# Patient Record
Sex: Male | Born: 1955 | Race: White | Hispanic: No | Marital: Married | Smoking: Former smoker
Health system: Southern US, Community
[De-identification: ages and names within clinical notes are randomized; demographics above are authoritative.]

## PROBLEM LIST (undated history)

## (undated) DIAGNOSIS — I447 Left bundle-branch block, unspecified: Secondary | ICD-10-CM

## (undated) DIAGNOSIS — R251 Tremor, unspecified: Secondary | ICD-10-CM

## (undated) DIAGNOSIS — G4730 Sleep apnea, unspecified: Secondary | ICD-10-CM

## (undated) DIAGNOSIS — N529 Male erectile dysfunction, unspecified: Secondary | ICD-10-CM

## (undated) DIAGNOSIS — G629 Polyneuropathy, unspecified: Secondary | ICD-10-CM

## (undated) DIAGNOSIS — Z87442 Personal history of urinary calculi: Secondary | ICD-10-CM

## (undated) DIAGNOSIS — M4800 Spinal stenosis, site unspecified: Secondary | ICD-10-CM

## (undated) DIAGNOSIS — M722 Plantar fascial fibromatosis: Secondary | ICD-10-CM

## (undated) HISTORY — DX: Left bundle-branch block, unspecified: I44.7

## (undated) HISTORY — DX: Polyneuropathy, unspecified: G62.9

## (undated) HISTORY — DX: Male erectile dysfunction, unspecified: N52.9

## (undated) HISTORY — DX: Sleep apnea, unspecified: G47.30

## (undated) HISTORY — PX: OTHER SURGICAL HISTORY: SHX169

## (undated) HISTORY — DX: Plantar fascial fibromatosis: M72.2

## (undated) HISTORY — PX: THYROID LOBECTOMY: SHX420

---

## 1996-02-29 HISTORY — PX: INGUINAL HERNIA REPAIR: SUR1180

## 1999-05-18 ENCOUNTER — Encounter: Admission: RE | Admit: 1999-05-18 | Discharge: 1999-05-18 | Payer: Self-pay | Admitting: Otolaryngology

## 1999-05-18 ENCOUNTER — Encounter: Payer: Self-pay | Admitting: Otolaryngology

## 1999-06-21 ENCOUNTER — Encounter: Payer: Self-pay | Admitting: *Deleted

## 1999-06-21 ENCOUNTER — Encounter: Admission: RE | Admit: 1999-06-21 | Discharge: 1999-06-21 | Payer: Self-pay | Admitting: *Deleted

## 1999-06-25 ENCOUNTER — Other Ambulatory Visit: Admission: RE | Admit: 1999-06-25 | Discharge: 1999-06-25 | Payer: Self-pay | Admitting: *Deleted

## 1999-07-05 ENCOUNTER — Encounter: Payer: Self-pay | Admitting: *Deleted

## 1999-07-07 ENCOUNTER — Ambulatory Visit (HOSPITAL_COMMUNITY): Admission: RE | Admit: 1999-07-07 | Discharge: 1999-07-08 | Payer: Self-pay | Admitting: *Deleted

## 1999-07-07 ENCOUNTER — Encounter (INDEPENDENT_AMBULATORY_CARE_PROVIDER_SITE_OTHER): Payer: Self-pay | Admitting: *Deleted

## 1999-10-13 ENCOUNTER — Encounter: Admission: RE | Admit: 1999-10-13 | Discharge: 1999-10-13 | Payer: Self-pay | Admitting: *Deleted

## 1999-10-13 ENCOUNTER — Encounter: Payer: Self-pay | Admitting: *Deleted

## 2007-06-15 ENCOUNTER — Ambulatory Visit (HOSPITAL_COMMUNITY): Admission: RE | Admit: 2007-06-15 | Discharge: 2007-06-15 | Payer: Self-pay | Admitting: Family Medicine

## 2010-04-06 ENCOUNTER — Ambulatory Visit (HOSPITAL_COMMUNITY)
Admission: RE | Admit: 2010-04-06 | Discharge: 2010-04-06 | Disposition: A | Discharge status: Home / Self Care | Payer: PRIVATE HEALTH INSURANCE | Source: Ambulatory Visit | Attending: Family Medicine | Admitting: Family Medicine

## 2010-04-06 ENCOUNTER — Other Ambulatory Visit (HOSPITAL_COMMUNITY): Payer: Self-pay | Admitting: Family Medicine

## 2010-04-06 DIAGNOSIS — R0609 Other forms of dyspnea: Secondary | ICD-10-CM

## 2010-04-06 DIAGNOSIS — R1013 Epigastric pain: Secondary | ICD-10-CM | POA: Insufficient documentation

## 2010-04-06 DIAGNOSIS — F17200 Nicotine dependence, unspecified, uncomplicated: Secondary | ICD-10-CM

## 2010-07-16 NOTE — Discharge Summary (Signed)
[place]  Patient:    EFRAIM, VANALLEN                      MRN: [id number] Adm. Date:  [id number] Disc. Date: [id number] Attending:  Aundria Mems CC:         Dr. Hyacinth Meeker                           Discharge [facility] COURSE: This patient underwent right thyroid lobectomy/isthmusectomy on Jul 07, 1999 for a solitary solid nodule of the right lower lobe near the isthmus.  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  His postoperative course was without incident, and he was discharged home the morning following surgery.  He had normal voice quality postoperatively.  DISCHARGE MEDICATION: Vicodin 5 mg 1-2 tablets q.4h as-needed for pain.  DISCHARGE ACTIVITY: Advised as to no strenuous activity.  WOUND CARE: To keep the wound clean and dry.  DISCHARGE INSTRUCTIONS: To call should there be any concerns regarding appearance of the wound or other concerns or problems.  FOLLOW-UP: Return to the office in seven to 10 days for suture removal.  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 DD:  07/08/99 TD:  07/09/99 Job: [id number]

## 2010-07-16 NOTE — Op Note (Signed)
[place]  Patient:    John Norton, John Norton                      MRN: [id number] Proc. Date: 07/07/99 Adm. Date:  [id number] Disc. Date: [id number] Attending:  Aundria Mems                           Operative Report  PREOPERATIVE DIAGNOSIS:  Solitary solid right thyroid nodule.  POSTOPERATIVE DIAGNOSIS:  Right thyroid lobectomy, isthmusectomy.  OPERATION PERFORMED:  Per frozen section, benign-appearing follicular adenoma.  SURGEON:  Kathy Breach, M.D.  ASSISTANT:  Gloris Manchester. Lazarus Salines, M.D.  ANESTHESIA:  DESCRIPTION OF PROCEDURE:  With the patient under general orotracheal anesthesia and the patients neck extended, the anterior neck, upper chest and lower face was prepped and draped in the usual sterile fashion.  A horizontal incision supersternally about three to four inches in length was made in a low-lying skin crease.  Incision made through skin and subcutaneous tissues lateral to the platysma.  Flaps were elevated superiorly to under the level of the undersurface of the thyroid cartilage and inferiorly.  The strap muscles were split vertically in the midline and exposure of the right lobe of the thyroid was obtained, elevating the strap muscles off of the surface of the right thyroid lobe.  The patient had the freely mobile 1 cm nodule present more into the right lower lobe and not quite as close to the junction with the isthmus as physical exam and the ultrasound had suggested.  There were no other palpable nodes in the neck.  The lower margin of the thyroid gland was dissected free working around laterally and then taking down the superior pole elevating the superior pole and lobe towards the midline, dissecting immediately on the capsule of the thyroid lobe, clamping and tying vessels as encountered.  The recurrent laryngeal nerve was identified, entering the cricothyroid space in the expected position and was preserved by freeing the gland  from the trachea releasing Berrys ligament.  A clamp was placed at the junction of the isthmus with the left thyroid lobe divided and specimen delivered.  Hemostasis kept complete throughout the procedure with 3-0 and 4-0 silk ties and light electrocautery away from the nerve.  The wound was irrigated, hemostasis complete.  Jackson-Pratt drain was placed along the right paratracheal site and brought through a stab incision just inferior to the right side of the incision.  The incision was closed with interrupted 3-0 chromic catgut subcutaneously and running 5-0 nylon in the skin.  Frozen section report returned by Dr. Francoise Schaumann as benign-appearing follicular adenoma. Blood loss for the procedure was less than 50 cc.  The patient tolerated the procedure well and was taken to the recovery room in stable general condition. DD:  07/07/99 TD:  07/09/99 Job: [id number]

## 2010-08-21 ENCOUNTER — Emergency Department (HOSPITAL_COMMUNITY): Payer: PRIVATE HEALTH INSURANCE

## 2010-08-21 ENCOUNTER — Emergency Department (HOSPITAL_COMMUNITY)
Admission: EM | Admit: 2010-08-21 | Discharge: 2010-08-21 | Disposition: A | Discharge status: Other Acute Inpatient Hospital | Payer: PRIVATE HEALTH INSURANCE | Source: Ambulatory Visit | Attending: Emergency Medicine | Admitting: Emergency Medicine

## 2010-08-21 ENCOUNTER — Inpatient Hospital Stay (HOSPITAL_COMMUNITY)
Admission: AD | Admit: 2010-08-21 | Discharge: 2010-08-22 | DRG: 313 | Disposition: A | Discharge status: Home / Self Care | Payer: PRIVATE HEALTH INSURANCE | Source: Other Acute Inpatient Hospital | Attending: Cardiovascular Disease | Admitting: Cardiovascular Disease

## 2010-08-21 DIAGNOSIS — I447 Left bundle-branch block, unspecified: Secondary | ICD-10-CM | POA: Diagnosis present

## 2010-08-21 DIAGNOSIS — N529 Male erectile dysfunction, unspecified: Secondary | ICD-10-CM | POA: Diagnosis present

## 2010-08-21 DIAGNOSIS — G20 Parkinson's disease: Secondary | ICD-10-CM | POA: Diagnosis present

## 2010-08-21 DIAGNOSIS — G20A1 Parkinson's disease without dyskinesia, without mention of fluctuations: Secondary | ICD-10-CM | POA: Insufficient documentation

## 2010-08-21 DIAGNOSIS — Z79899 Other long term (current) drug therapy: Secondary | ICD-10-CM | POA: Insufficient documentation

## 2010-08-21 DIAGNOSIS — R079 Chest pain, unspecified: Secondary | ICD-10-CM | POA: Insufficient documentation

## 2010-08-21 DIAGNOSIS — R0789 Other chest pain: Principal | ICD-10-CM | POA: Diagnosis present

## 2010-08-21 DIAGNOSIS — I10 Essential (primary) hypertension: Secondary | ICD-10-CM | POA: Diagnosis present

## 2010-08-21 DIAGNOSIS — R7309 Other abnormal glucose: Secondary | ICD-10-CM | POA: Diagnosis present

## 2010-08-21 DIAGNOSIS — Z87891 Personal history of nicotine dependence: Secondary | ICD-10-CM

## 2010-08-21 DIAGNOSIS — G589 Mononeuropathy, unspecified: Secondary | ICD-10-CM | POA: Insufficient documentation

## 2010-08-21 HISTORY — PX: OTHER SURGICAL HISTORY: SHX169

## 2010-08-21 LAB — BASIC METABOLIC PANEL
CO2: 31 mEq/L (ref 19–32)
Calcium: 9.8 mg/dL (ref 8.4–10.5)
Creatinine, Ser: 1.24 mg/dL (ref 0.50–1.35)
GFR calc non Af Amer: >60 mL/min (ref >60)
Glucose, Bld: 158 mg/dL — ABNORMAL HIGH (ref 70–99)
Sodium: 142 mEq/L (ref 135–145)

## 2010-08-21 LAB — CARDIAC PANEL(CRET KIN+CKTOT+MB+TROPI)
CK, MB: 1.5 ng/mL (ref 0.3–4.0)
Total CK: 50 U/L (ref 7–232)
Troponin I: <0.3 ng/mL (ref <0.30)

## 2010-08-21 LAB — CBC
MCH: 31.3 pg (ref 26.0–34.0)
MCHC: 33.5 g/dL (ref 30.0–36.0)
MCV: 93.3 fL (ref 78.0–100.0)
Platelets: 207 10*3/uL (ref 150–400)
RBC: 5.08 MIL/uL (ref 4.22–5.81)

## 2010-08-21 LAB — HEMOGLOBIN A1C
Hgb A1c MFr Bld: 5.9 % — ABNORMAL HIGH (ref <5.7)
Mean Plasma Glucose: 123 mg/dL — ABNORMAL HIGH (ref <117)

## 2010-08-21 LAB — CK TOTAL AND CKMB (NOT AT ARMC)
CK, MB: 1.8 ng/mL (ref 0.3–4.0)
Total CK: 69 U/L (ref 7–232)

## 2010-08-21 LAB — PROTIME-INR
INR: 1 (ref 0.00–1.49)
Prothrombin Time: 13.4 seconds (ref 11.6–15.2)

## 2010-08-21 LAB — DIFFERENTIAL
Basophils Relative: 1 % (ref 0–1)
Eosinophils Absolute: 0.1 10*3/uL (ref 0.0–0.7)
Lymphs Abs: 2.1 10*3/uL (ref 0.7–4.0)
Monocytes Absolute: 0.6 10*3/uL (ref 0.1–1.0)
Monocytes Relative: 8 % (ref 3–12)

## 2010-08-21 LAB — HEPATIC FUNCTION PANEL
Alkaline Phosphatase: 55 U/L (ref 39–117)
Indirect Bilirubin: 0.7 mg/dL (ref 0.3–0.9)
Total Bilirubin: 0.8 mg/dL (ref 0.3–1.2)

## 2010-08-22 ENCOUNTER — Inpatient Hospital Stay (HOSPITAL_COMMUNITY): Payer: PRIVATE HEALTH INSURANCE

## 2010-08-22 DIAGNOSIS — R079 Chest pain, unspecified: Secondary | ICD-10-CM

## 2010-08-22 HISTORY — PX: OTHER SURGICAL HISTORY: SHX169

## 2010-08-22 LAB — BASIC METABOLIC PANEL
BUN: 16 mg/dL (ref 6–23)
Chloride: 104 mEq/L (ref 96–112)
GFR calc Af Amer: >60 mL/min (ref >60)
GFR calc non Af Amer: >60 mL/min (ref >60)
Potassium: 4.4 mEq/L (ref 3.5–5.1)
Sodium: 139 mEq/L (ref 135–145)

## 2010-08-22 LAB — TSH: TSH: 0.746 u[IU]/mL (ref 0.350–4.500)

## 2010-08-22 LAB — CARDIAC PANEL(CRET KIN+CKTOT+MB+TROPI)
CK, MB: 1.1 ng/mL (ref 0.3–4.0)
Relative Index: INVALID (ref 0.0–2.5)
Troponin I: <0.3 ng/mL (ref <0.30)

## 2010-08-22 LAB — LIPID PANEL
Cholesterol: 161 mg/dL (ref 0–200)
HDL: 24 mg/dL — ABNORMAL LOW (ref >39)
Triglycerides: 247 mg/dL — ABNORMAL HIGH (ref <150)

## 2010-08-22 MED ORDER — TECHNETIUM TC 99M TETROFOSMIN IV KIT
30.0000 | PACK | Freq: Once | INTRAVENOUS | Status: AC | PRN
  Administered 2010-08-22: 30 via INTRAVENOUS

## 2010-08-22 MED ORDER — TECHNETIUM TC 99M TETROFOSMIN IV KIT
10.0000 | PACK | Freq: Once | INTRAVENOUS | Status: AC | PRN
  Administered 2010-08-22: 10 via INTRAVENOUS

## 2010-08-23 ENCOUNTER — Other Ambulatory Visit (HOSPITAL_COMMUNITY): Payer: PRIVATE HEALTH INSURANCE

## 2010-08-24 NOTE — H&P (Addendum)
NAMEEYOEL, THROGMORTON NO.:  [id number]  MEDICAL RECORD NO.:  [id number]  LOCATION:  3743                         FACILITY:  [facility]  PHYSICIAN:  Vesta Mixer, M.D. DATE OF BIRTH:  [birth year]  DATE OF ADMISSION:  08/21/2010 DATE OF DISCHARGE:                             HISTORY & PHYSICAL   CHIEF COMPLAINT:  Rib pain.  HISTORY OF PRESENT ILLNESS:  [name] is a 55 year old gentleman with no prior history of coronary artery disease, but a known left bundle branch block, Parkinson disease, and a remote normal stress test, who presented to [facility] with complaints of rib discomfort.  He describes an abdominal tightness sensation/discomfort, radiating up to his right chest.  It is intermittent and not particularly exacerbated by anything, but comes and goes on its own.  It was relieved by both nitroglycerin and morphine in the ED at [facility].  He is currently pain free upon transfer to [facility].  Enzymes have been negative x1.  His EKG shows a left bundle branch block with occasional PVC, otherwise, there were no acute changes.  He denies any exertional symptoms such as chest pain, shortness of breath, or diaphoresis while doing more strenuous ADLs like mowing the lawn.  PAST MEDICAL HISTORY: 1. Left bundle branch block. 2. Parkinson disease. 3. Erectile dysfunction. 4. Remote negative stress test. 5. Right thyroid lobectomy/isthmusectomy in May 2001, felt to be a     hyperplastic nodule.  MEDICATIONS: 1. Viagra 100 mg daily as needed for ED. 2. Propranolol 20 mg b.i.d. 3. Sinemet 25/100 mg t.i.d. 4. Azilect 1 mg daily. 5. Calcium OTC 1 tablet daily. 6. Multivitamin 1 tablet daily. 7. Vitamin B12 one tablet daily. 8. Fish oil 1 tablet daily. 9. Lyrica 200 mg t.i.d.  ALLERGIES:  NO KNOWN DRUG ALLERGIES.  SOCIAL HISTORY:  The patient lives in [place].  He is retired from [employer].  He has a remote history of tobacco  use, but quit.  He denies any alcohol use.  FAMILY HISTORY:  Negative for coronary artery disease in his immediate family.  He does have an aunt who had some sort of heart problems.  LABORATORY DATA:  WBC 7.4, hemoglobin 15.9, hematocrit 47.4, platelet count 207.  Sodium 142, potassium 3.7, chloride 103, CO2 of 31, glucose 158, BUN 22, creatinine 1.24.  Cardiac enzymes negative x1.  STUDIES:  Chest x-ray on August 21, 2010, showed negative chest.  PHYSICAL EXAMINATION:  VITAL SIGNS:  Temperature is 97.9, pulse 63, respirations 18, blood pressure 141/79, pulse ox 96%. GENERAL:  This is a pleasant white male in no acute distress. HEENT:  Normocephalic, atraumatic with extraocular movements intact. Sclerae _.  Nares without discharge. NECK:  Supple, without carotid bruits. Cardiovascular:  Auscultation to the heart reveals regular rate and rhythm, with S1 and S2 without murmurs, rubs, or gallops. Lungs:  Clear to auscultation bilaterally without wheezes, rales, or rhonchi. Abdomen:  Soft, nontender, nondistended, with positive bowel sounds. Extremities:  Warm and dry, without edema. Neurologic:  He is alert and oriented x3.  He responds to questions appropriately with a normal affect.  ASSESSMENT/PLAN:  The patient was seen and examined by  Dr. Elease Hashimoto and myself.  This is a 55 year old gentleman with no prior cardiac history, but history of known left bundle branch block with remote normal stress test as well as Parkinson disease, who presents to initially [facility] to the [facility] with complaints of chest discomfort/abdominal discomfort.  At this time, his symptoms are somewhat atypical in nature. His enzymes have been negative and EKG is without acute changes, but he did have some relief with nitroglycerin.  At this time, we will admit him to the hospital and cycle cardiac enzymes.  Give this Parkinson disease, we will plan for a Lexiscan Myoview in the morning to  risk stratify him.  His blood pressure was somewhat elevated at [facility], so he may be newly hypertensive, as well as with an elevated blood sugar of 158, we will rule out diabetes as well.  Further recommendations to follow based on findings.  He will require cardiac catheterization if his enzymes turn positive.  We will only fully anticoagulate if the patient's pain recurs or enzymes turn positive, and for now we will initiate him on DVT dosing heparin.     Ronie Spies, P.A.C.   ______________________________ Vesta Mixer, M.D.    DD/MEDQ  D:  08/21/2010  T:  08/21/2010  Job:  [id number]  cc:   Vesta Mixer, M.D.  Electronically Signed by Ronie Spies  on 08/24/2010 02:14:27 PM Electronically Signed by Kristeen Miss M.D. on 09/16/2010 02:44:54 PM

## 2010-08-30 ENCOUNTER — Encounter: Payer: Self-pay | Admitting: Nurse Practitioner

## 2010-09-07 ENCOUNTER — Ambulatory Visit: Payer: PRIVATE HEALTH INSURANCE | Admitting: Nurse Practitioner

## 2010-09-16 NOTE — Discharge Summary (Signed)
John Norton, KLECKNER NO.:  [id number]  MEDICAL RECORD NO.:  [id number]  LOCATION:  3743                         FACILITY:  [facility]  PHYSICIAN:  Vesta Mixer, M.D. DATE OF BIRTH:  1955/10/23  DATE OF ADMISSION:  08/21/2010 DATE OF DISCHARGE:  08/22/2010                              DISCHARGE SUMMARY   PRIMARY CARDIOLOGIST:  Vesta Mixer, MD  DISCHARGE DIAGNOSIS:  Chest pain, the patient ruled out for myocardial infarction this admission. a.  Status post Lexiscan Myoview.  There is no stress-induced ischemia, but a fixed deficit in the lateral apex and inferior wall. b.  Ejection fraction 51%.  SECONDARY DIAGNOSIS: 1. Left bundle-branch block. 2. Parkinson disease. 3. Erectile dysfunction. 4. Right thyroid lobectomy/isthmusectomy in May 2001 thought to be a     hyperplastic nodule.  ALLERGIES:  No known drug allergies.  PROCEDURES/DIAGNOSTICS PERFORMED DURING HOSPITALIZATION:1. Lexiscan Myoview on August 22, 2010:  There was a fixed defect in the     lateral apex and inferior wall.  Moderate septal hypokinesis is not     specific.  Ejection fraction 51%.  No stress-induced ischemia. 2. Chest x-ray on August 21, 2010:  No cardiopulmonary process noted.  REASON FOR HOSPITALIZATION:  This is a 55 year old gentleman without known prior coronary artery disease with the above-stated problem list who presented to the [facility] with rib discomfort, abdominal tightness and discomfort as well as radiating sensation up to his right chest.  Upon arrival to the emergency department, the pain was relieved by nitroglycerin and morphine.  His EKG showed left bundle-branch block with occasional PVC, left bundle-branch block is old.  Initial enzymes were negative x1.  The patient was transferred to [facility] for further evaluation.  The patient was sent to the Telemetry Unit at [facility].  He ruled out for myocardial infarction with cardiac enzymes being  negative x3.  His EKG was without acute changes, but did not note a known left bundle-branch block.  The patient had no further complaints of chest pain.  With his cardiac enzymes being negative, I was felt that Lexiscan Myoview would be ordered to risk stratify.  As above, Lexiscan Myoview showed no inducible ischemia, but a trach defect in the lateral apex and inferior wall with moderate septal hypokinesis.  Ejection fraction was preserved.  Dr. Johney Frame discussed Myoview results with the patient noting that was a scar, but not ischemia.  Options of medical treatment versus catheterization were discussed in full.  The patient made it clear that he wishes to avoid catheterization at this time.  Therefore, the patient will be discharged home on medical therapy with aspirin, beta blocker and sublingual nitroglycerin.  Further followup and further disposition for possible cardiac catheterization will be with Dr. Elease Hashimoto in the outpatient setting.  DISCHARGE LABORATORY DATA:  TSH 0.746.  Cardiac enzymes negative x3. Sodium 139, potassium 4.4, BUN 16, creatinine 0.85.  DISCHARGE MEDICATIONS: 1. Aspirin 81 mg enteric-coated 1 tablet daily. 2. Nitroglycerin 0.4 mg tablets sublingual 1 tablet under tongue every     5 minutes up to 3 doses. 3. Azilect 1 mg daily. 4. Calcium over-the-counter 1 tablet daily. 5. Carbidopa and levodopa 25/100  mg 1 tablet 3 times a day. 6. Fish oil over-the-counter 1 tablet daily. 7. Lyrica 200 mg 2 tablets 3 times a day. 8. Multivitamin over-the-counter daily. 9. Propranolol 20 mg twice daily. 10.Viagra 100 mg 1 tablet daily as needed for AD. 11.Vitamin B12 over-the-counter 1 tablet daily.  FOLLOWUP PLANS AND INSTRUCTIONS: 1. The patient will follow up with Dr. Elease Hashimoto in 2-3 weeks, the office     will call to schedule this appointment. 2. The patient should follow up with primary care provider as     previously scheduled. 3. The patient should increase  activity as tolerated. 4. The patient should continue low-sodium heart-healthy diet. 5. The patient should stop any activity that causes chest pain or     shortness of breath. 6. The patient should call the office in the interim for any problems     or concerns.  DURATION OF DISCHARGE:  Greater than 30 minutes with physician and physician assistant time.     Leonette Monarch, PA-C   ______________________________ Vesta Mixer, M.D.    NB/MEDQ  D:  08/22/2010  T:  08/23/2010  Job:  [id number]  cc:   Vesta Mixer, M.D.  Electronically Signed by Alen Blew P.A. on 09/02/2010 12:57:56 PM Electronically Signed by Kristeen Miss M.D. on 09/16/2010 02:44:51 PM

## 2010-10-04 ENCOUNTER — Telehealth: Payer: Self-pay | Admitting: Cardiovascular Disease

## 2010-10-04 NOTE — Telephone Encounter (Signed)
Spoke with dr Patty Sermons, pt should wait 12 hours between viagra and nitro use. Pharmacy catherine informed

## 2010-10-04 NOTE — Telephone Encounter (Signed)
Rep called to report drug interaction with Nitrosat from [employer] and Viagra from Dr. Lalla Brothers, please call Santina Evans back at [phone]

## 2010-10-04 NOTE — Telephone Encounter (Signed)
Dr. Lalla Brothers is not in the area any longer, needs to speak to someone about the drug interactions.

## 2010-10-04 NOTE — Telephone Encounter (Signed)
Pharmacy wants to know how long in between meds for safety. Told to call dr that prescribed viagra, dr Ian Bushman out of office this week.

## 2011-03-01 HISTORY — PX: POLYPECTOMY: SHX149

## 2011-03-01 HISTORY — PX: COLONOSCOPY: SHX174

## 2011-04-14 ENCOUNTER — Encounter: Payer: Self-pay | Admitting: Gastroenterology

## 2011-05-12 ENCOUNTER — Ambulatory Visit (AMBULATORY_SURGERY_CENTER): Payer: Commercial Managed Care - PPO | Admitting: *Deleted

## 2011-05-12 VITALS — Ht 72.0 in | Wt 210.1 lb

## 2011-05-12 DIAGNOSIS — Z1211 Encounter for screening for malignant neoplasm of colon: Secondary | ICD-10-CM

## 2011-05-12 MED ORDER — PEG-KCL-NACL-NASULF-NA ASC-C 100 G PO SOLR: 1.0000 | Freq: Once | ORAL | Status: DC

## 2011-05-26 ENCOUNTER — Other Ambulatory Visit: Payer: PRIVATE HEALTH INSURANCE | Admitting: Gastroenterology

## 2011-06-28 ENCOUNTER — Encounter: Payer: Self-pay | Admitting: Gastroenterology

## 2011-06-28 ENCOUNTER — Ambulatory Visit (AMBULATORY_SURGERY_CENTER): Payer: Commercial Managed Care - PPO | Admitting: Gastroenterology

## 2011-06-28 VITALS — BP 137/74 | HR 46 | Temp 96.9°F | Resp 18 | Ht 72.0 in | Wt 210.0 lb

## 2011-06-28 DIAGNOSIS — D126 Benign neoplasm of colon, unspecified: Secondary | ICD-10-CM

## 2011-06-28 DIAGNOSIS — Z1211 Encounter for screening for malignant neoplasm of colon: Secondary | ICD-10-CM

## 2011-06-28 MED ORDER — SODIUM CHLORIDE 0.9 % IV SOLN: 500.0000 mL | INTRAVENOUS | Status: DC

## 2011-06-28 NOTE — Progress Notes (Signed)
Patient did not have preoperative order for IV antibiotic SSI prophylaxis. (G8918)  Patient did not experience any of the following events: a burn prior to discharge; a fall within the facility; wrong site/side/patient/procedure/implant event; or a hospital transfer or hospital admission upon discharge from the facility. (G8907)  

## 2011-06-28 NOTE — Patient Instructions (Signed)

## 2011-06-28 NOTE — Op Note (Signed)
[facility] [address] [place], [place]  [address]  COLONOSCOPY PROCEDURE REPORT  PATIENT:  Kilo, Eshelman  MR#:  [id number] BIRTHDATE:  May 15, 1955, 56 yrs. old  GENDER:  male ENDOSCOPIST:  Judie Petit T. Russella Dar, MD, [facility] Referred by:  Karleen Hampshire, M.D. PROCEDURE DATE:  06/28/2011 PROCEDURE:  Colonoscopy with snare polypectomy ASA CLASS:  Class II INDICATIONS:  1) Routine Risk Screening MEDICATIONS:   MAC sedation, administered by CRNA, propofol (Diprivan) 150 mg IV DESCRIPTION OF PROCEDURE:   After the risks benefits and alternatives of the procedure were thoroughly explained, informed consent was obtained.  Digital rectal exam was performed and revealed no abnormalities.   The LB [id number]-[id number] endoscope was introduced through the anus and advanced to the cecum, which was identified by both the appendix and ileocecal valve, without limitations.  The quality of the prep was good, using MoviPrep. The instrument was then slowly withdrawn as the colon was fully examined. <<PROCEDUREIMAGES>> FINDINGS:  Two polyps were found in the proximal transverse colon. They were 5 - 6 mm in size. Polyps were snared without cautery. Retrieval was successful. A sessile polyp was found in the sigmoid colon. It was 5 mm in size. Polyp was snared without cautery. Retrieval was unsuccessful. Otherwise normal colonoscopy without other polyps, masses, vascular ectasias, or inflammatory changes. Retroflexed views in the rectum revealed no abnormalities. The time to cecum =  2  minutes. The scope was then withdrawn (time = 10.67 min) from the patient and the procedure completed.  COMPLICATIONS:  None  ENDOSCOPIC IMPRESSION: 1) 5 - 6 mm Two polyps in the proximal transverse colon 2) 5 mm sessile polyp in the sigmoid colon  RECOMMENDATIONS: 1) Await pathology results 2) If the polyps are adenomatous (pre-cancerous), repeat colonoscopy in 5 years. Otherwise follow colorectal  cancer screening guidelines for "routine risk" patients with colonoscopy in 10 years.  Venita Lick. Russella Dar, MD, Clementeen Graham  n. eSIGNED:   Venita Lick. Taila Basinski at 06/28/2011 10:31 AM  [address], [id number]

## 2011-06-29 ENCOUNTER — Telehealth: Payer: Self-pay | Admitting: *Deleted

## 2011-06-29 NOTE — Telephone Encounter (Signed)
No answer, left message to call office for questions or concerns. 

## 2011-07-04 ENCOUNTER — Encounter: Payer: Self-pay | Admitting: Gastroenterology

## 2011-07-13 ENCOUNTER — Encounter: Payer: Self-pay | Admitting: Gastroenterology

## 2011-10-19 ENCOUNTER — Other Ambulatory Visit (HOSPITAL_COMMUNITY): Payer: Self-pay | Admitting: Physician Assistant

## 2011-10-19 DIAGNOSIS — R109 Unspecified abdominal pain: Secondary | ICD-10-CM

## 2011-10-21 ENCOUNTER — Ambulatory Visit (HOSPITAL_COMMUNITY)
Admission: RE | Admit: 2011-10-21 | Discharge: 2011-10-21 | Disposition: A | Discharge status: Home / Self Care | Payer: Commercial Managed Care - PPO | Source: Ambulatory Visit | Attending: Physician Assistant | Admitting: Physician Assistant

## 2011-10-21 DIAGNOSIS — R109 Unspecified abdominal pain: Secondary | ICD-10-CM | POA: Insufficient documentation

## 2011-10-21 DIAGNOSIS — R918 Other nonspecific abnormal finding of lung field: Secondary | ICD-10-CM | POA: Insufficient documentation

## 2011-10-21 DIAGNOSIS — N201 Calculus of ureter: Secondary | ICD-10-CM | POA: Insufficient documentation

## 2012-01-13 ENCOUNTER — Other Ambulatory Visit (HOSPITAL_COMMUNITY): Payer: Self-pay | Admitting: Physician Assistant

## 2012-01-13 DIAGNOSIS — Z87442 Personal history of urinary calculi: Secondary | ICD-10-CM

## 2012-01-13 DIAGNOSIS — R109 Unspecified abdominal pain: Secondary | ICD-10-CM

## 2012-01-13 DIAGNOSIS — J984 Other disorders of lung: Secondary | ICD-10-CM

## 2012-01-16 ENCOUNTER — Ambulatory Visit (HOSPITAL_COMMUNITY)
Admission: RE | Admit: 2012-01-16 | Discharge: 2012-01-16 | Disposition: A | Discharge status: Home / Self Care | Payer: Commercial Managed Care - PPO | Source: Ambulatory Visit | Attending: Physician Assistant | Admitting: Physician Assistant

## 2012-01-16 DIAGNOSIS — R911 Solitary pulmonary nodule: Secondary | ICD-10-CM | POA: Insufficient documentation

## 2012-01-16 DIAGNOSIS — J984 Other disorders of lung: Secondary | ICD-10-CM

## 2012-01-16 MED ORDER — IOHEXOL 300 MG/ML  SOLN
80.0000 mL | Freq: Once | INTRAMUSCULAR | Status: AC | PRN
  Administered 2012-01-16: 80 mL via INTRAVENOUS

## 2012-04-16 ENCOUNTER — Emergency Department (HOSPITAL_COMMUNITY)
Admission: EM | Admit: 2012-04-16 | Discharge: 2012-04-16 | Disposition: A | Discharge status: Home / Self Care | Payer: Commercial Managed Care - PPO | Attending: Emergency Medicine | Admitting: Emergency Medicine

## 2012-04-16 ENCOUNTER — Emergency Department (HOSPITAL_COMMUNITY): Payer: Commercial Managed Care - PPO

## 2012-04-16 ENCOUNTER — Encounter (HOSPITAL_COMMUNITY): Payer: Self-pay | Admitting: *Deleted

## 2012-04-16 ENCOUNTER — Other Ambulatory Visit (HOSPITAL_COMMUNITY): Payer: Self-pay | Admitting: Internal Medicine

## 2012-04-16 DIAGNOSIS — Z79899 Other long term (current) drug therapy: Secondary | ICD-10-CM | POA: Insufficient documentation

## 2012-04-16 DIAGNOSIS — G20 Parkinson's disease: Secondary | ICD-10-CM | POA: Insufficient documentation

## 2012-04-16 DIAGNOSIS — Z8669 Personal history of other diseases of the nervous system and sense organs: Secondary | ICD-10-CM | POA: Insufficient documentation

## 2012-04-16 DIAGNOSIS — R1013 Epigastric pain: Secondary | ICD-10-CM

## 2012-04-16 DIAGNOSIS — Z87891 Personal history of nicotine dependence: Secondary | ICD-10-CM | POA: Insufficient documentation

## 2012-04-16 DIAGNOSIS — G20A1 Parkinson's disease without dyskinesia, without mention of fluctuations: Secondary | ICD-10-CM | POA: Insufficient documentation

## 2012-04-16 DIAGNOSIS — Z7982 Long term (current) use of aspirin: Secondary | ICD-10-CM | POA: Insufficient documentation

## 2012-04-16 DIAGNOSIS — Z8679 Personal history of other diseases of the circulatory system: Secondary | ICD-10-CM | POA: Insufficient documentation

## 2012-04-16 DIAGNOSIS — K8050 Calculus of bile duct without cholangitis or cholecystitis without obstruction: Secondary | ICD-10-CM

## 2012-04-16 DIAGNOSIS — R110 Nausea: Secondary | ICD-10-CM | POA: Insufficient documentation

## 2012-04-16 DIAGNOSIS — Z87448 Personal history of other diseases of urinary system: Secondary | ICD-10-CM | POA: Insufficient documentation

## 2012-04-16 DIAGNOSIS — Z87442 Personal history of urinary calculi: Secondary | ICD-10-CM | POA: Insufficient documentation

## 2012-04-16 DIAGNOSIS — K8020 Calculus of gallbladder without cholecystitis without obstruction: Secondary | ICD-10-CM

## 2012-04-16 LAB — HEPATIC FUNCTION PANEL
AST: 25 U/L (ref 0–37)
Bilirubin, Direct: 0.2 mg/dL (ref 0.0–0.3)
Total Bilirubin: 0.9 mg/dL (ref 0.3–1.2)

## 2012-04-16 LAB — CBC WITH DIFFERENTIAL/PLATELET
Eosinophils Absolute: 0.2 10*3/uL (ref 0.0–0.7)
Eosinophils Relative: 2 % (ref 0–5)
Hemoglobin: 14.1 g/dL (ref 13.0–17.0)
Lymphs Abs: 2.5 10*3/uL (ref 0.7–4.0)
MCH: 31.1 pg (ref 26.0–34.0)
MCV: 89.6 fL (ref 78.0–100.0)
Monocytes Relative: 6 % (ref 3–12)
RBC: 4.53 MIL/uL (ref 4.22–5.81)

## 2012-04-16 LAB — BASIC METABOLIC PANEL
BUN: 15 mg/dL (ref 6–23)
Calcium: 9.8 mg/dL (ref 8.4–10.5)
Creatinine, Ser: 0.86 mg/dL (ref 0.50–1.35)
GFR calc non Af Amer: >90 mL/min (ref >90)
Glucose, Bld: 111 mg/dL — ABNORMAL HIGH (ref 70–99)

## 2012-04-16 LAB — LIPASE, BLOOD: Lipase: 33 U/L (ref 11–59)

## 2012-04-16 MED ORDER — ONDANSETRON HCL 4 MG/2ML IJ SOLN
4.0000 mg | Freq: Once | INTRAMUSCULAR | Status: AC
  Administered 2012-04-16: 4 mg via INTRAVENOUS
  Filled 2012-04-16: qty 2

## 2012-04-16 MED ORDER — HYDROMORPHONE HCL PF 1 MG/ML IJ SOLN
1.0000 mg | Freq: Once | INTRAMUSCULAR | Status: AC
  Administered 2012-04-16: 1 mg via INTRAVENOUS
  Filled 2012-04-16: qty 1

## 2012-04-16 MED ORDER — OXYCODONE-ACETAMINOPHEN 5-325 MG PO TABS: 2.0000 | ORAL_TABLET | ORAL | Status: DC | PRN

## 2012-04-16 NOTE — ED Provider Notes (Signed)
History    This chart was scribed for Gilda Crease, MD by Gerlean Ren, ED Scribe. This patient was seen in room APA10/APA10 and the patient's care was started at 4:39 PM    CSN: [id number]  Arrival date & time 04/16/12  1609   First MD Initiated Contact with Patient 04/16/12 1637      Chief Complaint  Patient presents with  . Abdominal Pain     The history is provided by the patient. No language interpreter was used.  John Norton is a 57 y.o. male with h/o LBBB, Parkinson's disease who presents to the Emergency Department complaining of constant, moderate epigastric and RUQ pain with gradual onset beginning at 1:00 PM after eating.  Pain radiates to back between scapulae.  Associated nausea but no emesis.  Pt reports first incident of this pain occurred 1.5 years ago after eating and has occurred multiple times since.  Pt reports frequency and severity of pain have been increasing over the past several months.  Pt saw Dr. Sherwood Gambler this morning for the worsening problem but states he felt fine prior to the visit and that pain began after eating 2 hours after leaving the office.  Pt has gall bladder US scheduled for tomorrow.     Past Medical History  Diagnosis Date  . LBBB (left bundle branch block)   . Parkinson's disease   . ED (erectile dysfunction)   . Neuropathy     trigeminal pain  . Sleep apnea     does not use cpap  . Kidney stone     Past Surgical History  Procedure Laterality Date  . Thyroid lobectomy      RIGHT THYROID  . Lexiscan myoview  08/22/10     There is no stress-induced ischemia,  but a fixed deficit in the lateral apex and inferior wall.  Ejection fraction 51%  . Chest x-ray  08/21/10    No cardiopulmonary process noted.  . Inguinal hernia repair  1998  . "spot on lung"      Family History  Problem Relation Age of Onset  . Colon polyps Brother   . Colon cancer Neg Hx   . Rectal cancer Neg Hx   . Stomach cancer Neg Hx   . Esophageal cancer  Neg Hx     History  Substance Use Topics  . Smoking status: Former Smoker    Quit date: 07/30/2010  . Smokeless tobacco: Current User  . Alcohol Use: No      Review of Systems  Gastrointestinal: Positive for nausea and abdominal pain. Negative for vomiting.  All other systems reviewed and are negative.    Allergies  Codeine  Home Medications   Current Outpatient Rx  Name  Route  Sig  Dispense  Refill  . aspirin 81 MG EC tablet   Oral   Take 81 mg by mouth daily.           . carbidopa-levodopa (SINEMET) 25-100 MG per tablet   Oral   Take 1 tablet by mouth 3 (three) times daily.           . Cholecalciferol (D3 ADULT PO)   Oral   Take by mouth.         . Cyanocobalamin (VITAMIN B12 PO)   Oral   Take by mouth daily.           . fish oil-omega-3 fatty acids 1000 MG capsule   Oral   Take 1 g by mouth daily.           Marland Kitchen  Ginkgo Biloba 40 MG TABS   Oral   Take 120 mg by mouth.         . Multiple Vitamin (MULTIVITAMIN) tablet   Oral   Take 1 tablet by mouth daily.           . nitroGLYCERIN (NITROSTAT) 0.4 MG SL tablet   Sublingual   Place 0.4 mg under the tongue every 5 (five) minutes as needed.           . pregabalin (LYRICA) 200 MG capsule   Oral   Take 400 mg by mouth 3 (three) times daily.           . propranolol (INDERAL) 20 MG tablet   Oral   Take 20 mg by mouth 2 (two) times daily.           . rasagiline (AZILECT) 0.5 MG TABS   Oral   Take 1 mg by mouth daily.           . sildenafil (VIAGRA) 100 MG tablet   Oral   Take 100 mg by mouth daily as needed.             BP 167/75  Pulse 51  Temp(Src) 97.7 F (36.5 C) (Oral)  Resp 20  Ht 6' (1.829 m)  Wt 210 lb (95.255 kg)  BMI 28.47 kg/m2  SpO2 100%  Physical Exam  Nursing note and vitals reviewed. Constitutional: He is oriented to person, place, and time. He appears well-developed and well-nourished. No distress.  HENT:  Head: Normocephalic and atraumatic.   Eyes: EOM are normal.  Neck: Normal range of motion. Neck supple.  Cardiovascular: Normal rate and regular rhythm.   No murmur heard. Pulmonary/Chest: Effort normal and breath sounds normal. No respiratory distress. He has no wheezes.  Abdominal: Soft. Bowel sounds are normal. He exhibits no distension. There is tenderness.  Epigastric and RUQ tenderness, no guarding, no rebound.  Musculoskeletal: Normal range of motion.  Neurological: He is alert and oriented to person, place, and time.  Skin: Skin is warm and dry.  Psychiatric: He has a normal mood and affect. His behavior is normal.    ED Course  Procedures (including critical care time) DIAGNOSTIC STUDIES: Oxygen Saturation is 100% on room air, normal by my interpretation.    COORDINATION OF CARE: 4:41 PM- Patient informed of clinical course, understands medical decision-making process, and agrees with plan.  Results for orders placed during the hospital encounter of 04/16/12  CBC WITH DIFFERENTIAL      Result Value Range   WBC 10.3  4.0 - 10.5 K/uL   RBC 4.53  4.22 - 5.81 MIL/uL   Hemoglobin 14.1  13.0 - 17.0 g/dL   HCT 98.1  19.1 - 47.8 %   MCV 89.6  78.0 - 100.0 fL   MCH 31.1  26.0 - 34.0 pg   MCHC 34.7  30.0 - 36.0 g/dL   RDW 29.5  62.1 - 30.8 %   Platelets 204  150 - 400 K/uL   Neutrophils Relative 68  43 - 77 %   Neutro Abs 6.9  1.7 - 7.7 K/uL   Lymphocytes Relative 24  12 - 46 %   Lymphs Abs 2.5  0.7 - 4.0 K/uL   Monocytes Relative 6  3 - 12 %   Monocytes Absolute 0.6  0.1 - 1.0 K/uL   Eosinophils Relative 2  0 - 5 %   Eosinophils Absolute 0.2  0.0 - 0.7 K/uL   Basophils Relative  0  0 - 1 %   Basophils Absolute 0.0  0.0 - 0.1 K/uL  BASIC METABOLIC PANEL      Result Value Range   Sodium 138  135 - 145 mEq/L   Potassium 3.5  3.5 - 5.1 mEq/L   Chloride 100  96 - 112 mEq/L   CO2 28  19 - 32 mEq/L   Glucose, Bld 111 (*) 70 - 99 mg/dL   BUN 15  6 - 23 mg/dL   Creatinine, Ser 1.91  0.50 - 1.35 mg/dL   Calcium  9.8  8.4 - 47.8 mg/dL   GFR calc non Af Amer >90  >90 mL/min   GFR calc Af Amer >90  >90 mL/min  HEPATIC FUNCTION PANEL      Result Value Range   Total Protein 7.6  6.0 - 8.3 g/dL   Albumin 4.1  3.5 - 5.2 g/dL   AST 25  0 - 37 U/L   ALT 10  0 - 53 U/L   Alkaline Phosphatase 62  39 - 117 U/L   Total Bilirubin 0.9  0.3 - 1.2 mg/dL   Bilirubin, Direct 0.2  0.0 - 0.3 mg/dL   Indirect Bilirubin 0.7  0.3 - 0.9 mg/dL  LIPASE, BLOOD      Result Value Range   Lipase 33  11 - 59 U/L    US Abdomen Limited Ruq  04/16/2012  *RADIOLOGY REPORT*  Clinical Data:  Postprandial right upper abdominal pain  LIMITED ABDOMINAL ULTRASOUND - RIGHT UPPER QUADRANT  Comparison:  CT 10/21/2011  Findings:  Gallbladder:  Multiple stones in the gallbladder lumen, measuring up to 11 mm diameter.  No gallbladder wall thickening or pericholecystic fluid.  Sonographer reports no sonographic Murphy's sign.  Common bile duct:  5.8 mm diameter, without evidence of choledocholithiasis.  Liver:  Normal size and echotexture without focal parenchymal abnormality.  Patent portal vein with hepatopetal flow.  IMPRESSION:  Cholelithiasis without other ultrasound evidence of cholecystitis or biliary obstruction.                    Original Report Authenticated By: D. Andria Rhein, MD      Diagnoses: Biliary colic; Cholelithiasis   MDM  Patient comes to the ER with complaints of abdominal pain. Patient has been having severe right upper quadrant abdominal pain radiating into his back after eating. He reports that he has been having infrequent episodes of this for more than a year, but this week has become quite frequent. He has had at least 4 separate episodes, each one worse than the last. He saw his doctor today and was scheduled for ultrasound tomorrow, but after he went home ate lunch, pain returned.  Ultrasound confirms multiple gallstones without any evidence of acute cholecystitis. Patient is feeling much better now having been  administered and I wanted. Case discussed briefly with Dr. Lovell Sheehan. He will see the patient at 1:30 PM tomorrow and schedule for cholecystectomy. Patient prescribed Percocet and counseled to eat bland, nonfatty food. He is to come back to the ER if pain is not controlled.   I personally performed the services described in this documentation, which was scribed in my presence. The recorded information has been reviewed and is accurate.         Gilda Crease, MD 04/16/12 [phone]

## 2012-04-16 NOTE — ED Notes (Signed)
Pt aware will get chest xray/ekg for preop and will be sent home. Nad.

## 2012-04-16 NOTE — ED Notes (Signed)
Epigastric and RUQ pain, with pain between scapula.  Nausea, no vomiting.  Seen by Dr Sherwood Gambler this am, and has scheduled for GB u/s. Tomorrow.  Pt says pain occurs when he eats.

## 2012-04-17 ENCOUNTER — Encounter (HOSPITAL_COMMUNITY): Payer: Self-pay

## 2012-04-17 ENCOUNTER — Ambulatory Visit (HOSPITAL_COMMUNITY): Payer: Medicare Other

## 2012-04-17 ENCOUNTER — Encounter (HOSPITAL_COMMUNITY)
Admission: RE | Admit: 2012-04-17 | Discharge: 2012-04-17 | Disposition: A | Discharge status: Home / Self Care | Payer: Commercial Managed Care - PPO | Source: Ambulatory Visit | Attending: General Surgery | Admitting: General Surgery

## 2012-04-17 LAB — SURGICAL PCR SCREEN: MRSA, PCR: NEGATIVE

## 2012-04-17 NOTE — Patient Instructions (Addendum)
AXYL SITZMAN  04/17/2012   Your procedure is scheduled on:  04/20/2012  Report to [facility] at  900  AM.  Call this number if you have problems the morning of surgery: [phone]   Remember:   Do not eat food or drink liquids after midnight.   Take these medicines the morning of surgery with A SIP OF WATER:  Lyrica,inderal,azilect   Do not wear jewelry, make-up or nail polish.  Do not wear lotions, powders, or perfumes.   Do not shave 48 hours prior to surgery. Men may shave face and neck.  Do not bring valuables to the hospital.  Contacts, dentures or bridgework may not be worn into surgery.  Leave suitcase in the car. After surgery it may be brought to your room.  For patients admitted to the hospital, checkout time is 11:00 AM the day of discharge.   Patients discharged the day of surgery will not be allowed to drive home.  Name and phone number of your driver: family  Special Instructions: Shower using CHG 2 nights before surgery and the night before surgery.  If you shower the day of surgery use CHG.  Use special wash - you have one bottle of CHG for all showers.  You should use approximately 1/3 of the bottle for each shower.   Please read over the following fact sheets that you were given: Pain Booklet, Coughing and Deep Breathing, MRSA Information, Surgical Site Infection Prevention, Anesthesia Post-op Instructions and Care and Recovery After Surgery Laparoscopic Cholecystectomy Laparoscopic cholecystectomy is surgery to remove the gallbladder. The gallbladder is located slightly to the right of center in the abdomen, behind the liver. It is a concentrating and storage sac for the bile produced in the liver. Bile aids in the digestion and absorption of fats. Gallbladder disease (cholecystitis) is an inflammation of your gallbladder. This condition is usually caused by a buildup of gallstones (cholelithiasis) in your gallbladder. Gallstones can block the flow of bile,  resulting in inflammation and pain. In severe cases, emergency surgery may be required. When emergency surgery is not required, you will have time to prepare for the procedure. Laparoscopic surgery is an alternative to open surgery. Laparoscopic surgery usually has a shorter recovery time. Your common bile duct may also need to be examined and explored. Your caregiver will discuss this with you if he or she feels this should be done. If stones are found in the common bile duct, they may be removed. LET YOUR CAREGIVER KNOW ABOUT:  Allergies to food or medicine.  Medicines taken, including vitamins, herbs, eyedrops, over-the-counter medicines, and creams.  Use of steroids (by mouth or creams).  Previous problems with anesthetics or numbing medicines.  History of bleeding problems or blood clots.  Previous surgery.  Other health problems, including diabetes and kidney problems.  Possibility of pregnancy, if this applies. RISKS AND COMPLICATIONS All surgery is associated with risks. Some problems that may occur following this procedure include:  Infection.  Damage to the common bile duct, nerves, arteries, veins, or other internal organs such as the stomach or intestines.  Bleeding.  A stone may remain in the common bile duct. BEFORE THE PROCEDURE  Do not take aspirin for 3 days prior to surgery or blood thinners for 1 week prior to surgery.  Do not eat or drink anything after midnight the night before surgery.  Let your caregiver know if you develop a cold or other infectious problem prior to surgery.  You should be present 60 minutes before the procedure or as directed. PROCEDURE  You will be given medicine that makes you sleep (general anesthetic). When you are asleep, your surgeon will make several small cuts (incisions) in your abdomen. One of these incisions is used to insert a small, lighted scope (laparoscope) into the abdomen. The laparoscope helps the surgeon see into  your abdomen. Carbon dioxide gas will be pumped into your abdomen. The gas allows more room for the surgeon to perform your surgery. Other operating instruments are inserted through the other incisions. Laparoscopic procedures may not be appropriate when:  There is major scarring from previous surgery.  The gallbladder is extremely inflamed.  There are bleeding disorders or unexpected cirrhosis of the liver.  A pregnancy is near term.  Other conditions make the laparoscopic procedure impossible. If your surgeon feels it is not safe to continue with a laparoscopic procedure, he or she will perform an open abdominal procedure. In this case, the surgeon will make an incision to open the abdomen. This gives the surgeon a larger view and field to work within. This may allow the surgeon to perform procedures that sometimes cannot be performed with a laparoscope alone. Open surgery has a longer recovery time. AFTER THE PROCEDURE  You will be taken to the recovery area where a nurse will watch and check your progress.  You may be allowed to go home the same day.  Do not resume physical activities until directed by your caregiver.  You may resume a normal diet and activities as directed. Document Released: 02/14/2005 Document Revised: 05/09/2011 Document Reviewed: 07/30/2010 [facility] Patient Information 2013 [place], [place]. PATIENT INSTRUCTIONS POST-ANESTHESIA  IMMEDIATELY FOLLOWING SURGERY:  Do not drive or operate machinery for the first twenty four hours after surgery.  Do not make any important decisions for twenty four hours after surgery or while taking narcotic pain medications or sedatives.  If you develop intractable nausea and vomiting or a severe headache please notify your doctor immediately.  FOLLOW-UP:  Please make an appointment with your surgeon as instructed. You do not need to follow up with anesthesia unless specifically instructed to do so.  WOUND CARE INSTRUCTIONS (if  applicable):  Keep a dry clean dressing on the anesthesia/puncture wound site if there is drainage.  Once the wound has quit draining you may leave it open to air.  Generally you should leave the bandage intact for twenty four hours unless there is drainage.  If the epidural site drains for more than 36-48 hours please call the anesthesia department.  QUESTIONS?:  Please feel free to call your physician or the hospital operator if you have any questions, and they will be happy to assist you.

## 2012-04-17 NOTE — H&P (Signed)
  NTS SOAP Note  Vital Signs:  Vitals as of: 04/17/2012: Systolic 159: Diastolic 73: Heart Rate 54: Temp 98.52F: Height 35ft 0in: Weight 210Lbs 0 Ounces: Pain Level 2: BMI 28  BMI : 28.48 kg/m2  Subjective: This 57 Years 47 Months old Male presents for of    ABDOMINAL PAIN : ,Has had four episodes of right upper quadrant abdominal pain radiating to the right flank, nausea, and bloating.  Has occurred with fatty meals.  Seen in ER yesterday, found to have cholelithiasis.  No fever, chills, jaundice.  Review of Symptoms:    tremors, fatigue    headache Eyes:unremarkable   Nose/Mouth/Throat:unremarkable Cardiovascular:  unremarkable   Respiratory:unremarkable   Gastrointestin    abdominal pain,nausea,vomiting,heartburn Genitourinary:unremarkable       joint and back pain dry Hematolgic/Lymphatic:unremarkable     Allergic/Immunologic:unremarkable     Past Medical History:    Reviewed   Past Medical History  Surgical History: inguinal herniorrhaphy, thyroid surgery Medical Problems: Parkinson's Allergies: nkda  Medications: lyrica, axilect, sinemet, propranolol   Social History:Reviewed  Social History  Preferred Language: English Race:  White Ethnicity: Other Age: 57 Years 10 Months Marital Status:  M Alcohol:  No Recreational drug(s):  No   Smoking Status: Never smoker reviewed on 04/17/2012 Functional Status reviewed on mm/dd/yyyy ------------------------------------------------ Bathing: Normal Cooking: Normal Dressing: Normal Driving: Normal Eating: Normal Managing Meds: Normal Oral Care: Normal Shopping: Normal Toileting: Normal Transferring: Normal Walking: Normal Cognitive Status reviewed on mm/dd/yyyy ------------------------------------------------ Attention: Normal Decision Making: Normal Language: Normal Memory: Normal Motor: Normal Perception: Normal Problem Solving: Normal Visual and Spatial:  Normal   Family History:  Reviewed   Family History  Is there a family history JY:[id number]    Objective Information: General:  Well appearing, well nourished in no distress.   no scleral icterus Heart:  RRR, no murmur Lungs:    CTA bilaterally, no wheezes, rhonchi, rales.  Breathing unlabored. Abdomen:Soft, NT/ND, no HSM, no masses.  Assessment:Cholecystitis, cholelithiasis  Diagnosis &amp; Procedure:    Plan:Scheduled for laparoscopic cholecystectomy on 04/20/12.   Patient Education:Alternative treatments to surgery were discussed with patient (and family).  Risks and benefits  of procedure including bleeding, infection, hepatobiliary injury, and the possibility of an open procedure were fully explained to the patient (and family) who gave informed consent. Patient/family questions were addressed.  Follow-up:Pending Surgery

## 2012-04-18 ENCOUNTER — Encounter (HOSPITAL_COMMUNITY): Payer: Self-pay | Admitting: Pharmacy Technician

## 2012-04-20 ENCOUNTER — Encounter (HOSPITAL_COMMUNITY): Payer: Self-pay | Admitting: Anesthesiology

## 2012-04-20 ENCOUNTER — Encounter (HOSPITAL_COMMUNITY): Payer: Self-pay | Admitting: *Deleted

## 2012-04-20 ENCOUNTER — Ambulatory Visit (HOSPITAL_COMMUNITY): Payer: Commercial Managed Care - PPO | Admitting: Anesthesiology

## 2012-04-20 ENCOUNTER — Ambulatory Visit (HOSPITAL_COMMUNITY)
Admission: RE | Admit: 2012-04-20 | Discharge: 2012-04-20 | Disposition: A | Discharge status: Home / Self Care | Payer: Commercial Managed Care - PPO | Source: Ambulatory Visit | Attending: General Surgery | Admitting: General Surgery

## 2012-04-20 ENCOUNTER — Encounter (HOSPITAL_COMMUNITY)
Admission: RE | Disposition: A | Discharge status: Home / Self Care | Payer: Self-pay | Source: Ambulatory Visit | Attending: General Surgery

## 2012-04-20 DIAGNOSIS — G20 Parkinson's disease: Secondary | ICD-10-CM | POA: Insufficient documentation

## 2012-04-20 DIAGNOSIS — G20A1 Parkinson's disease without dyskinesia, without mention of fluctuations: Secondary | ICD-10-CM | POA: Insufficient documentation

## 2012-04-20 DIAGNOSIS — K8010 Calculus of gallbladder with chronic cholecystitis without obstruction: Secondary | ICD-10-CM | POA: Insufficient documentation

## 2012-04-20 HISTORY — PX: CHOLECYSTECTOMY: SHX55

## 2012-04-20 SURGERY — LAPAROSCOPIC CHOLECYSTECTOMY
Anesthesia: General | Site: Abdomen | Wound class: Contaminated

## 2012-04-20 MED ORDER — FENTANYL CITRATE 0.05 MG/ML IJ SOLN
25.0000 ug | INTRAMUSCULAR | Status: DC | PRN
  Administered 2012-04-20 (×4): 50 ug via INTRAVENOUS

## 2012-04-20 MED ORDER — FENTANYL CITRATE 0.05 MG/ML IJ SOLN
INTRAMUSCULAR | Status: AC
  Filled 2012-04-20: qty 2

## 2012-04-20 MED ORDER — GLYCOPYRROLATE 0.2 MG/ML IJ SOLN
INTRAMUSCULAR | Status: DC | PRN
  Administered 2012-04-20: 0.2 mg via INTRAVENOUS
  Administered 2012-04-20: 0.6 mg via INTRAVENOUS

## 2012-04-20 MED ORDER — ARTIFICIAL TEARS OP OINT
TOPICAL_OINTMENT | OPHTHALMIC | Status: AC
  Filled 2012-04-20: qty 3.5

## 2012-04-20 MED ORDER — ONDANSETRON HCL 4 MG/2ML IJ SOLN
INTRAMUSCULAR | Status: AC
  Filled 2012-04-20: qty 2

## 2012-04-20 MED ORDER — GLYCOPYRROLATE 0.2 MG/ML IJ SOLN
INTRAMUSCULAR | Status: AC
  Filled 2012-04-20: qty 1

## 2012-04-20 MED ORDER — ONDANSETRON HCL 4 MG/2ML IJ SOLN
4.0000 mg | Freq: Once | INTRAMUSCULAR | Status: AC
  Administered 2012-04-20: 4 mg via INTRAVENOUS

## 2012-04-20 MED ORDER — LACTATED RINGERS IV SOLN
INTRAVENOUS | Status: DC | PRN
  Administered 2012-04-20 (×2): via INTRAVENOUS

## 2012-04-20 MED ORDER — OXYCODONE-ACETAMINOPHEN 5-325 MG PO TABS: 1.0000 | ORAL_TABLET | ORAL | Status: DC | PRN

## 2012-04-20 MED ORDER — BUPIVACAINE HCL (PF) 0.5 % IJ SOLN
INTRAMUSCULAR | Status: AC
  Filled 2012-04-20: qty 30

## 2012-04-20 MED ORDER — LIDOCAINE HCL (PF) 1 % IJ SOLN
INTRAMUSCULAR | Status: AC
  Filled 2012-04-20: qty 5

## 2012-04-20 MED ORDER — MIDAZOLAM HCL 2 MG/2ML IJ SOLN
1.0000 mg | INTRAMUSCULAR | Status: DC | PRN
  Administered 2012-04-20: 2 mg via INTRAVENOUS

## 2012-04-20 MED ORDER — CHLORHEXIDINE GLUCONATE 4 % EX LIQD: 1.0000 "application " | Freq: Once | CUTANEOUS | Status: DC

## 2012-04-20 MED ORDER — FENTANYL CITRATE 0.05 MG/ML IJ SOLN
INTRAMUSCULAR | Status: DC | PRN
  Administered 2012-04-20 (×2): 100 ug via INTRAVENOUS
  Administered 2012-04-20: 50 ug via INTRAVENOUS

## 2012-04-20 MED ORDER — KETOROLAC TROMETHAMINE 30 MG/ML IJ SOLN
30.0000 mg | Freq: Once | INTRAMUSCULAR | Status: AC
  Administered 2012-04-20: 30 mg via INTRAVENOUS

## 2012-04-20 MED ORDER — ROCURONIUM BROMIDE 100 MG/10ML IV SOLN
INTRAVENOUS | Status: DC | PRN
  Administered 2012-04-20: 25 mg via INTRAVENOUS

## 2012-04-20 MED ORDER — GLYCOPYRROLATE 0.2 MG/ML IJ SOLN
INTRAMUSCULAR | Status: AC
  Filled 2012-04-20: qty 3

## 2012-04-20 MED ORDER — EPHEDRINE SULFATE 50 MG/ML IJ SOLN
INTRAMUSCULAR | Status: DC | PRN
  Administered 2012-04-20: 10 mg via INTRAVENOUS

## 2012-04-20 MED ORDER — CEFAZOLIN SODIUM-DEXTROSE 2-3 GM-% IV SOLR: 2.0000 g | INTRAVENOUS | Status: DC

## 2012-04-20 MED ORDER — MIDAZOLAM HCL 2 MG/2ML IJ SOLN
INTRAMUSCULAR | Status: AC
  Filled 2012-04-20: qty 2

## 2012-04-20 MED ORDER — KETOROLAC TROMETHAMINE 30 MG/ML IJ SOLN
INTRAMUSCULAR | Status: AC
  Filled 2012-04-20: qty 1

## 2012-04-20 MED ORDER — SODIUM CHLORIDE 0.9 % IR SOLN
Status: DC | PRN
  Administered 2012-04-20: 1000 mL

## 2012-04-20 MED ORDER — ENOXAPARIN SODIUM 40 MG/0.4ML ~~LOC~~ SOLN
40.0000 mg | Freq: Once | SUBCUTANEOUS | Status: AC
  Administered 2012-04-20: 40 mg via SUBCUTANEOUS

## 2012-04-20 MED ORDER — CEFAZOLIN SODIUM-DEXTROSE 2-3 GM-% IV SOLR
INTRAVENOUS | Status: DC | PRN
  Administered 2012-04-20: 2 g via INTRAVENOUS

## 2012-04-20 MED ORDER — ENOXAPARIN SODIUM 40 MG/0.4ML ~~LOC~~ SOLN
SUBCUTANEOUS | Status: AC
  Filled 2012-04-20: qty 0.4

## 2012-04-20 MED ORDER — CEFAZOLIN SODIUM-DEXTROSE 2-3 GM-% IV SOLR
INTRAVENOUS | Status: AC
  Filled 2012-04-20: qty 50

## 2012-04-20 MED ORDER — BUPIVACAINE HCL (PF) 0.5 % IJ SOLN
INTRAMUSCULAR | Status: DC | PRN
  Administered 2012-04-20: 10 mL

## 2012-04-20 MED ORDER — FENTANYL CITRATE 0.05 MG/ML IJ SOLN
INTRAMUSCULAR | Status: AC
  Filled 2012-04-20: qty 5

## 2012-04-20 MED ORDER — PROPOFOL 10 MG/ML IV EMUL
INTRAVENOUS | Status: DC | PRN
  Administered 2012-04-20: 180 mg via INTRAVENOUS

## 2012-04-20 MED ORDER — ONDANSETRON HCL 4 MG/2ML IJ SOLN: 4.0000 mg | Freq: Once | INTRAMUSCULAR | Status: DC | PRN

## 2012-04-20 MED ORDER — EPHEDRINE SULFATE 50 MG/ML IJ SOLN
INTRAMUSCULAR | Status: AC
  Filled 2012-04-20: qty 1

## 2012-04-20 MED ORDER — LACTATED RINGERS IV SOLN
INTRAVENOUS | Status: DC
  Administered 2012-04-20: 12:00:00 via INTRAVENOUS

## 2012-04-20 MED ORDER — NEOSTIGMINE METHYLSULFATE 1 MG/ML IJ SOLN
INTRAMUSCULAR | Status: DC | PRN
  Administered 2012-04-20: 4 mg via INTRAVENOUS

## 2012-04-20 MED ORDER — PROPOFOL 10 MG/ML IV EMUL
INTRAVENOUS | Status: AC
  Filled 2012-04-20: qty 20

## 2012-04-20 MED ORDER — GLYCOPYRROLATE 0.2 MG/ML IJ SOLN
0.2000 mg | Freq: Once | INTRAMUSCULAR | Status: AC
  Administered 2012-04-20: 0.2 mg via INTRAVENOUS

## 2012-04-20 MED ORDER — HEMOSTATIC AGENTS (NO CHARGE) OPTIME
TOPICAL | Status: DC | PRN
  Administered 2012-04-20 (×2): 1 via TOPICAL

## 2012-04-20 MED ORDER — SUCCINYLCHOLINE CHLORIDE 20 MG/ML IJ SOLN
INTRAMUSCULAR | Status: DC | PRN
  Administered 2012-04-20: 120 mg via INTRAVENOUS

## 2012-04-20 MED ORDER — ROCURONIUM BROMIDE 50 MG/5ML IV SOLN
INTRAVENOUS | Status: AC
  Filled 2012-04-20: qty 1

## 2012-04-20 SURGICAL SUPPLY — 35 items
APPLIER CLIP LAPSCP 10X32 DD (CLIP) ×2 IMPLANT
BAG HAMPER (MISCELLANEOUS) ×2 IMPLANT
BAG SPEC RTRVL LRG 6X4 10 (ENDOMECHANICALS) ×1
CLOTH BEACON ORANGE TIMEOUT ST (SAFETY) ×2 IMPLANT
COVER LIGHT HANDLE STERIS (MISCELLANEOUS) ×4 IMPLANT
DECANTER SPIKE VIAL GLASS SM (MISCELLANEOUS) ×2 IMPLANT
DURAPREP 26ML APPLICATOR (WOUND CARE) ×2 IMPLANT
ELECT REM PT RETURN 9FT ADLT (ELECTROSURGICAL) ×2
ELECTRODE REM PT RTRN 9FT ADLT (ELECTROSURGICAL) ×1 IMPLANT
FORMALIN 10 PREFIL 120ML (MISCELLANEOUS) ×2 IMPLANT
GLOVE BIO SURGEON STRL SZ7.5 (GLOVE) ×2 IMPLANT
GLOVE BIOGEL PI IND STRL 7.0 (GLOVE) IMPLANT
GLOVE BIOGEL PI IND STRL 7.5 (GLOVE) IMPLANT
GLOVE BIOGEL PI INDICATOR 7.0 (GLOVE) ×3
GLOVE BIOGEL PI INDICATOR 7.5 (GLOVE) ×1
GLOVE ECLIPSE 6.5 STRL STRAW (GLOVE) ×2 IMPLANT
GLOVE ECLIPSE 7.0 STRL STRAW (GLOVE) ×2 IMPLANT
GLOVE EXAM NITRILE MD LF STRL (GLOVE) ×1 IMPLANT
GOWN STRL REIN XL XLG (GOWN DISPOSABLE) ×8 IMPLANT
HEMOSTAT SNOW SURGICEL 2X4 (HEMOSTASIS) ×3 IMPLANT
INST SET LAPROSCOPIC AP (KITS) ×2 IMPLANT
KIT ROOM TURNOVER APOR (KITS) ×2 IMPLANT
KIT TROCAR LAP CHOLE (TROCAR) ×2 IMPLANT
MANIFOLD NEPTUNE II (INSTRUMENTS) ×2 IMPLANT
NS IRRIG 1000ML POUR BTL (IV SOLUTION) ×2 IMPLANT
PACK LAP CHOLE LZT030E (CUSTOM PROCEDURE TRAY) ×2 IMPLANT
PAD ARMBOARD 7.5X6 YLW CONV (MISCELLANEOUS) ×2 IMPLANT
POUCH SPECIMEN RETRIEVAL 10MM (ENDOMECHANICALS) ×2 IMPLANT
SET BASIN LINEN APH (SET/KITS/TRAYS/PACK) ×2 IMPLANT
SPONGE GAUZE 2X2 8PLY STRL LF (GAUZE/BANDAGES/DRESSINGS) ×8 IMPLANT
STAPLER VISISTAT (STAPLE) ×2 IMPLANT
SUT VICRYL 0 UR6 27IN ABS (SUTURE) ×3 IMPLANT
TAPE CLOTH SURG 4X10 WHT LF (GAUZE/BANDAGES/DRESSINGS) ×1 IMPLANT
WARMER LAPAROSCOPE (MISCELLANEOUS) ×2 IMPLANT
YANKAUER SUCT 12FT TUBE ARGYLE (SUCTIONS) ×2 IMPLANT

## 2012-04-20 NOTE — Op Note (Signed)
Patient:  RAYANE GALLARDO  DOB:  1955/09/15  MRN:  [id number]   Preop Diagnosis:  Cholecystitis, cholelithiasis  Postop Diagnosis:  Same  Procedure:  Laparoscopic cholecystectomy  Surgeon:  Franky Macho, M.D.  Anes:  General  Indications:  Patient is a 57 year old white male who presents with cholecystitis secondary to cholelithiasis. The risks and benefits of the procedure including bleeding, infection, hepatobiliary, and the possibility of an open procedure were fully explained to the patient, who gave informed consent.  Procedure note:  The patient was placed the supine position. After induction of general endotracheal anesthesia, the abdomen was prepped and draped using usual sterile technique with DuraPrep. Surgical site confirmation was performed.  A supraumbilical incision was made down to the fascia. A Veress needle was introduced into the abdominal cavity and confirmation of placement was done using the saline drop test. The abdomen was then insufflated to 16 mm mercury pressure. An 11 mm trocar was introduced into the abdominal cavity under direct visualization without difficulty. The patient was placed in reverse Trendelenburg position and additional 11 mm trocar was placed the epigastric region and 5 mm trochars were placed the right upper quadrant and right flank regions. The liver was inspected and noted within normal limits. The gallbladder was noted to be thickened and edematous. The gallbladder was decompressed in order to facilitate exposure. The gallbladder was retracted in a dynamic fashion in order to expose the triangle of Calot. The cystic duct was first identified. Junction to the infundibulum flow identified. Endoclips were placed proximally and distally on the cystic duct, and the cystic duct was divided. This is likewise done cystic artery. The gallbladder was then freed away from the gallbladder fossa using Bovie electrocautery. The gallbladder was delivered through the  epigastric trocar site using an Endo Catch bag. The gallbladder fossa was inspected and no abnormal bleeding or bile leakage was noted. Surgicel was placed the gallbladder fossa. All fluid and air were then evacuated from the abdominal cavity prior to removal of the trochars.  All wounds were irrigated with normal saline. All wounds were injected with 0.5% Sensorcaine. The supraumbilical fascia as well as epigastric fascia were reapproximated using 0 Vicryl interrupted sutures. All skin incisions were closed using staples. Betadine ointment and dry sterile dressings were applied.  All tape and needle counts were correct at the end of the procedure. The patient was extubated in the operating room and transferred to PACU in stable condition.  Complications:  None  EBL:  Minimal  Specimen:  Gallbladder

## 2012-04-20 NOTE — Preoperative (Signed)
Beta Blockers   Reason not to administer Beta Blockers:Not Applicable 

## 2012-04-20 NOTE — Interval H&P Note (Signed)
History and Physical Interval Note:  04/20/2012 11:26 AM  John Norton  has presented today for surgery, with the diagnosis of cholelithiasis  The various methods of treatment have been discussed with the patient and family. After consideration of risks, benefits and other options for treatment, the patient has consented to  Procedure(s): LAPAROSCOPIC CHOLECYSTECTOMY (N/A) as a surgical intervention .  The patient's history has been reviewed, patient examined, no change in status, stable for surgery.  I have reviewed the patient's chart and labs.  Questions were answered to the patient's satisfaction.     Franky Macho A

## 2012-04-20 NOTE — Anesthesia Procedure Notes (Signed)
Procedure Name: Intubation Date/Time: 04/20/2012 12:38 PM Performed by: Carolyne Littles, Breyson Kelm L Pre-anesthesia Checklist: Patient identified, Patient being monitored, Timeout performed, Emergency Drugs available and Suction available Patient Re-evaluated:Patient Re-evaluated prior to inductionOxygen Delivery Method: Circle System Utilized Preoxygenation: Pre-oxygenation with 100% oxygen Intubation Type: IV induction Ventilation: Mask ventilation without difficulty Laryngoscope Size: 3 and Miller Grade View: Grade I Tube type: Oral Tube size: 7.0 mm Number of attempts: 1 Airway Equipment and Method: stylet Placement Confirmation: ETT inserted through vocal cords under direct vision,  positive ETCO2 and breath sounds checked- equal and bilateral Secured at: 21 cm Tube secured with: Tape Dental Injury: Teeth and Oropharynx as per pre-operative assessment

## 2012-04-20 NOTE — Anesthesia Preprocedure Evaluation (Signed)
Anesthesia Evaluation  Patient identified by MRN, date of birth, ID band Patient awake    Reviewed: Allergy & Precautions, H&P , NPO status , Patient's Chart, lab work & pertinent test results, reviewed documented beta blocker date and time   Airway Mallampati: I TM Distance: >3 FB Neck ROM: Full    Dental  (+) Teeth Intact   Pulmonary sleep apnea ,    Pulmonary exam normal       Cardiovascular + dysrhythmias Rhythm:Regular Rate:Bradycardia     Neuro/Psych PSYCHIATRIC DISORDERS  Neuromuscular disease (Parkinson's disease; neuropathy--trigeminal pain)    GI/Hepatic Neg liver ROS,   Endo/Other  negative endocrine ROS  Renal/GU Renal disease     Musculoskeletal negative musculoskeletal ROS (+)   Abdominal Normal abdominal exam  (+)   Peds  Hematology negative hematology ROS (+)   Anesthesia Other Findings   Reproductive/Obstetrics                           Anesthesia Physical Anesthesia Plan  ASA: II  Anesthesia Plan: General   Post-op Pain Management:    Induction: Intravenous  Airway Management Planned: Oral ETT  Additional Equipment:   Intra-op Plan:   Post-operative Plan: Extubation in OR  Informed Consent: I have reviewed the patients History and Physical, chart, labs and discussed the procedure including the risks, benefits and alternatives for the proposed anesthesia with the patient or authorized representative who has indicated his/her understanding and acceptance.     Plan Discussed with: CRNA  Anesthesia Plan Comments:         Anesthesia Quick Evaluation

## 2012-04-20 NOTE — Transfer of Care (Signed)
Immediate Anesthesia Transfer of Care Note  Patient: John Norton  Procedure(s) Performed: Procedure(s): LAPAROSCOPIC CHOLECYSTECTOMY (N/A)  Patient Location: PACU  Anesthesia Type:General  Level of Consciousness: awake, alert , oriented and patient cooperative  Airway & Oxygen Therapy: Patient Spontanous Breathing and Patient connected to face mask oxygen  Post-op Assessment: Report given to PACU RN and Post -op Vital signs reviewed and stable  Post vital signs: Reviewed and stable  Complications: No apparent anesthesia complications

## 2012-04-20 NOTE — Anesthesia Postprocedure Evaluation (Signed)
  Anesthesia Post-op Note  Patient: John Norton  Procedure(s) Performed: Procedure(s): LAPAROSCOPIC CHOLECYSTECTOMY (N/A)  Patient Location: PACU  Anesthesia Type:General  Level of Consciousness: awake, alert , oriented and patient cooperative  Airway and Oxygen Therapy: Patient Spontanous Breathing and Patient connected to face mask oxygen  Post-op Pain: moderate  Post-op Assessment: Post-op Vital signs reviewed, Patient's Cardiovascular Status Stable, Respiratory Function Stable, Patent Airway, No signs of Nausea or vomiting and Adequate PO intake  Post-op Vital Signs: Reviewed and stable  Complications: No apparent anesthesia complications

## 2012-04-23 ENCOUNTER — Encounter (HOSPITAL_COMMUNITY): Payer: Self-pay | Admitting: General Surgery

## 2012-05-14 DIAGNOSIS — G252 Other specified forms of tremor: Secondary | ICD-10-CM

## 2012-05-14 HISTORY — DX: Other specified forms of tremor: G25.2

## 2013-01-09 ENCOUNTER — Other Ambulatory Visit (HOSPITAL_COMMUNITY): Payer: Self-pay | Admitting: Physician Assistant

## 2013-01-09 DIAGNOSIS — R911 Solitary pulmonary nodule: Secondary | ICD-10-CM

## 2013-01-15 ENCOUNTER — Ambulatory Visit (HOSPITAL_COMMUNITY)
Admission: RE | Admit: 2013-01-15 | Discharge: 2013-01-15 | Disposition: A | Discharge status: Home / Self Care | Payer: Medicare Other | Source: Ambulatory Visit | Attending: Physician Assistant | Admitting: Physician Assistant

## 2013-01-15 DIAGNOSIS — R911 Solitary pulmonary nodule: Secondary | ICD-10-CM | POA: Insufficient documentation

## 2013-01-15 DIAGNOSIS — I2510 Atherosclerotic heart disease of native coronary artery without angina pectoris: Secondary | ICD-10-CM | POA: Insufficient documentation

## 2013-01-15 DIAGNOSIS — D1809 Hemangioma of other sites: Secondary | ICD-10-CM | POA: Insufficient documentation

## 2013-01-15 DIAGNOSIS — J479 Bronchiectasis, uncomplicated: Secondary | ICD-10-CM | POA: Insufficient documentation

## 2013-01-15 DIAGNOSIS — I700 Atherosclerosis of aorta: Secondary | ICD-10-CM | POA: Insufficient documentation

## 2013-01-15 DIAGNOSIS — K7689 Other specified diseases of liver: Secondary | ICD-10-CM | POA: Insufficient documentation

## 2013-01-15 DIAGNOSIS — Z9089 Acquired absence of other organs: Secondary | ICD-10-CM | POA: Insufficient documentation

## 2013-11-26 ENCOUNTER — Telehealth: Payer: Self-pay | Admitting: Family Medicine

## 2013-11-29 NOTE — Telephone Encounter (Signed)
Detailed message left that if she is still interested in becoming a patient to our office to please call us back.

## 2013-12-02 NOTE — Telephone Encounter (Signed)
[employer] and he has parkinsons. Patient will need to be seen in March for a physical and I advised his wife to call about 2 months before physical due and we would get him scheduled. She is also going to come by and sign release for records. They are aware if they need to be seen before then to please call

## 2015-03-01 DIAGNOSIS — M722 Plantar fascial fibromatosis: Secondary | ICD-10-CM | POA: Insufficient documentation

## 2015-03-01 HISTORY — DX: Plantar fascial fibromatosis: M72.2

## 2015-05-06 DIAGNOSIS — G20 Parkinson's disease: Secondary | ICD-10-CM | POA: Diagnosis not present

## 2015-05-06 DIAGNOSIS — Z79899 Other long term (current) drug therapy: Secondary | ICD-10-CM | POA: Diagnosis not present

## 2015-06-11 DIAGNOSIS — M79674 Pain in right toe(s): Secondary | ICD-10-CM | POA: Diagnosis not present

## 2015-06-11 DIAGNOSIS — M722 Plantar fascial fibromatosis: Secondary | ICD-10-CM | POA: Diagnosis not present

## 2015-06-30 ENCOUNTER — Encounter: Payer: Self-pay | Admitting: Family Medicine

## 2015-06-30 ENCOUNTER — Ambulatory Visit (INDEPENDENT_AMBULATORY_CARE_PROVIDER_SITE_OTHER): Payer: Medicare Other | Admitting: Family Medicine

## 2015-06-30 VITALS — BP 128/68 | HR 61 | Temp 97.4°F | Ht 72.0 in | Wt 213.6 lb

## 2015-06-30 DIAGNOSIS — J028 Acute pharyngitis due to other specified organisms: Secondary | ICD-10-CM | POA: Diagnosis not present

## 2015-06-30 MED ORDER — MAGIC MOUTHWASH W/LIDOCAINE: 5.0000 mL | Freq: Three times a day (TID) | ORAL | Status: DC

## 2015-06-30 NOTE — Progress Notes (Signed)
   Subjective:    Patient ID: John Norton, male    DOB: 07/02/1955, 60 y.o.   MRN: [id number]  HPI 60 year old gentleman, first visit here, with sore throat. Sore throat started fairly suddenly yesterday afternoon. It is described as burning. He has not had any fever, headache. He has been on no antibiotic recently.  He has a history of Parkinson's disease for the past 10 years. He is treated with Sinemet amantadine and selegiline. 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 He is seen by neurology at [facility].  There are no active problems to display for this patient.  Outpatient Encounter Prescriptions as of 06/30/2015  Medication Sig  . Amantadine HCl 100 MG tablet Take 1 tablet by mouth 3 (three) times daily.  . Ascorbic Acid ([employer] VITAMIN C) 1000 MG CHEW Chew 1-2 each by mouth every morning.  Marland Kitchen aspirin 81 MG EC tablet Take 81 mg by mouth daily.    Marland Kitchen Bioflavonoid Products (VITAMIN C) CHEW Chew 1 tablet by mouth every morning.  . carbidopa-levodopa (SINEMET) 25-100 MG per tablet Take 1 tablet by mouth 3 (three) times daily. Takes three to four times daily  . cholecalciferol (VITAMIN D) 1000 UNITS tablet Take 1,000 Units by mouth every morning.  . Cyanocobalamin (VITAMIN B-12 PO) Take 5,000 Units by mouth every morning.  . diphenhydramine-acetaminophen (TYLENOL PM) 25-500 MG TABS Take 2 tablets by mouth once as needed (pain/sleep.).   Marland Kitchen Multiple Vitamin (MULTIVITAMIN WITH MINERALS) TABS Take 1 tablet by mouth every morning.  . nitroGLYCERIN (NITROSTAT) 0.4 MG SL tablet Place 0.4 mg under the tongue every 5 (five) minutes as needed for chest pain.   . Omega-3 Fatty Acids (FISH OIL) 1200 MG CAPS Take 1 capsule by mouth every morning.  . pregabalin (LYRICA) 200 MG capsule Take 200 mg by mouth 3 (three) times daily.   . propranolol (INDERAL) 20 MG tablet Take 20 mg by mouth 2 (two) times daily.    . selegiline (ELDEPRYL) 5 MG capsule Take 1 capsule by mouth  2 (two) times daily.  . sildenafil (VIAGRA) 100 MG tablet Take 100 mg by mouth daily as needed for erectile dysfunction.   . [DISCONTINUED] oxyCODONE-acetaminophen (PERCOCET) 5-325 MG per tablet Take 1-2 tablets by mouth every 4 (four) hours as needed for pain.  . [DISCONTINUED] rasagiline (AZILECT) 1 MG TABS Take 1 mg by mouth every morning.   No facility-administered encounter medications on file as of 06/30/2015.      Review of Systems  Constitutional: Negative.   HENT: Positive for sore throat.   Respiratory: Negative.   Cardiovascular: Negative.   Neurological: Positive for tremors.  Psychiatric/Behavioral: Negative.        Objective:   Physical Exam  Constitutional: He appears well-developed and well-nourished.  HENT:  Mouth/Throat: Oropharynx is clear and moist.  Tongue has a white coating consistent with thrush. Throat is moderately inflamed. There is no significant adenopathy.          Assessment & Plan:  1. Acute pharyngitis due to other specified organisms Clinically, I believe this patient has thrush. Will treat with Magic mouthwash including Mycostatin lidocaine cortisol. May also take ibuprofen 800 mg 3 times a day for the next 2-3 days  Wardell Honour MD

## 2015-12-07 DIAGNOSIS — G20 Parkinson's disease: Secondary | ICD-10-CM | POA: Diagnosis not present

## 2015-12-07 DIAGNOSIS — Z885 Allergy status to narcotic agent status: Secondary | ICD-10-CM | POA: Diagnosis not present

## 2015-12-07 DIAGNOSIS — Z79899 Other long term (current) drug therapy: Secondary | ICD-10-CM | POA: Diagnosis not present

## 2015-12-28 NOTE — Progress Notes (Signed)
Subjective:    Patient ID: John Norton, male    DOB: 12-Jun-1955, 61 y.o.   MRN: [id number]  HPI 60 year old gentleman here for physical exam. He is followed by neurologist at [facility] for Parkinson's as well as essential tremor. He takes Sinemet every 4 hours. There is some gait instability but he has had no recent falls. There are no difficulties with swallowing, depression, constipation, the usual issues with Parkinson's.  There are no active problems to display for this patient.  Outpatient Encounter Prescriptions as of 12/29/2015  Medication Sig  . Amantadine HCl 100 MG tablet Take 1 tablet by mouth 3 (three) times daily.  . Ascorbic Acid ([employer]S VITAMIN C) 1000 MG CHEW Chew 1-2 each by mouth every morning.  Marland Kitchen aspirin 81 MG EC tablet Take 81 mg by mouth daily.    Marland Kitchen Bioflavonoid Products (VITAMIN C) CHEW Chew 1 tablet by mouth every morning.  . carbidopa-levodopa (SINEMET) 25-100 MG per tablet Take 0.5 tablets by mouth 4 (four) times daily. Takes three to four times daily  . cholecalciferol (VITAMIN D) 1000 UNITS tablet Take 1,000 Units by mouth every morning.  . Cyanocobalamin (VITAMIN B-12 PO) Take 5,000 Units by mouth every morning.  . diphenhydramine-acetaminophen (TYLENOL PM) 25-500 MG TABS Take 2 tablets by mouth once as needed (pain/sleep.).   Marland Kitchen Multiple Vitamin (MULTIVITAMIN WITH MINERALS) TABS Take 1 tablet by mouth every morning.  . nitroGLYCERIN (NITROSTAT) 0.4 MG SL tablet Place 0.4 mg under the tongue every 5 (five) minutes as needed for chest pain.   . Omega-3 Fatty Acids (FISH OIL) 1200 MG CAPS Take 1 capsule by mouth every morning.  . pregabalin (LYRICA) 200 MG capsule Take 200 mg by mouth 3 (three) times daily.   . propranolol (INDERAL) 60 MG tablet Take 60 mg by mouth daily.  . selegiline (ELDEPRYL) 5 MG capsule Take 1 capsule by mouth 2 (two) times daily.  . sildenafil (VIAGRA) 100 MG tablet Take 100 mg by mouth daily as needed for erectile dysfunction.   .  [DISCONTINUED] magic mouthwash w/lidocaine SOLN Take 5 mLs by mouth 3 (three) times daily.  . [DISCONTINUED] propranolol (INDERAL) 20 MG tablet Take 20 mg by mouth 2 (two) times daily.     No facility-administered encounter medications on file as of 12/29/2015.       Review of Systems  Constitutional: Negative.   HENT: Negative.   Respiratory: Negative.   Cardiovascular: Negative.   Genitourinary: Negative.   Neurological: Positive for tremors.  Psychiatric/Behavioral: Negative.        Objective:   Physical Exam  Constitutional: He is oriented to person, place, and time. He appears well-developed and well-nourished.  HENT:  Mouth/Throat: Oropharynx is clear and moist.  Eyes: Pupils are equal, round, and reactive to light.  Neck: Normal range of motion.  Cardiovascular: Normal rate, regular rhythm and normal heart sounds.   Pulmonary/Chest: Effort normal and breath sounds normal.  Abdominal: Soft. There is no tenderness.  Genitourinary: Prostate normal.  Neurological: He is alert and oriented to person, place, and time. Coordination normal.  There is a resting tremor more onthelefthandandintentiontremormoreontherighthand.Rapidalternatingmovementswiththumbandforefingermoredifficultinthelefthand.   BP 133/80   Pulse (!) 55   Temp 97.1 F (36.2 C) (Oral)   Ht 6' (1.829 m)   Wt 211 lb (95.7 kg)   BMI 28.62 kg/m         Assessment & Plan:  1. General medical exam We'll do routine labs. Has not had PSA recently - CMP14+EGFR -  Lipid panel - PSA, total and free  2. Parkinson's disease ([place]) Symptoms are managed by neurology at [facility]

## 2015-12-29 ENCOUNTER — Ambulatory Visit (INDEPENDENT_AMBULATORY_CARE_PROVIDER_SITE_OTHER): Payer: Medicare Other | Admitting: Family Medicine

## 2015-12-29 ENCOUNTER — Encounter: Payer: Self-pay | Admitting: Family Medicine

## 2015-12-29 VITALS — BP 133/80 | HR 55 | Temp 97.1°F | Ht 72.0 in | Wt 211.0 lb

## 2015-12-29 DIAGNOSIS — Z0000 Encounter for general adult medical examination without abnormal findings: Secondary | ICD-10-CM | POA: Diagnosis not present

## 2015-12-29 DIAGNOSIS — Z136 Encounter for screening for cardiovascular disorders: Secondary | ICD-10-CM | POA: Diagnosis not present

## 2015-12-29 DIAGNOSIS — Z125 Encounter for screening for malignant neoplasm of prostate: Secondary | ICD-10-CM | POA: Diagnosis not present

## 2015-12-29 DIAGNOSIS — G20 Parkinson's disease: Secondary | ICD-10-CM

## 2015-12-29 DIAGNOSIS — G250 Essential tremor: Secondary | ICD-10-CM

## 2015-12-29 MED ORDER — PREGABALIN 200 MG PO CAPS: 200.0000 mg | ORAL_CAPSULE | Freq: Three times a day (TID) | ORAL | 2 refills | Status: DC

## 2015-12-30 DIAGNOSIS — G20 Parkinson's disease: Secondary | ICD-10-CM | POA: Insufficient documentation

## 2015-12-30 DIAGNOSIS — G20A1 Parkinson's disease without dyskinesia, without mention of fluctuations: Secondary | ICD-10-CM | POA: Insufficient documentation

## 2015-12-30 DIAGNOSIS — G250 Essential tremor: Secondary | ICD-10-CM | POA: Insufficient documentation

## 2015-12-30 HISTORY — DX: Parkinson's disease: G20

## 2015-12-30 HISTORY — DX: Parkinson's disease without dyskinesia, without mention of fluctuations: G20.A1

## 2015-12-30 LAB — LIPID PANEL
Chol/HDL Ratio: 6.1 ratio units — ABNORMAL HIGH (ref 0.0–5.0)
Cholesterol, Total: 164 mg/dL (ref 100–199)
HDL: 27 mg/dL — AB (ref >39)
LDL Calculated: 92 mg/dL (ref 0–99)
Triglycerides: 226 mg/dL — ABNORMAL HIGH (ref 0–149)
VLDL Cholesterol Cal: 45 mg/dL — ABNORMAL HIGH (ref 5–40)

## 2015-12-30 LAB — CMP14+EGFR
A/G RATIO: 1.5 (ref 1.2–2.2)
ALT: 9 IU/L (ref 0–44)
AST: 15 IU/L (ref 0–40)
Albumin: 4.3 g/dL (ref 3.5–5.5)
Alkaline Phosphatase: 84 IU/L (ref 39–117)
BILIRUBIN TOTAL: 0.5 mg/dL (ref 0.0–1.2)
BUN / CREAT RATIO: 20 (ref 9–20)
BUN: 21 mg/dL (ref 6–24)
CHLORIDE: 105 mmol/L (ref 96–106)
CO2: 26 mmol/L (ref 18–29)
Calcium: 9.7 mg/dL (ref 8.7–10.2)
Creatinine, Ser: 1.07 mg/dL (ref 0.76–1.27)
GFR calc non Af Amer: 76 mL/min/{1.73_m2} (ref >59)
GFR, EST AFRICAN AMERICAN: 87 mL/min/{1.73_m2} (ref >59)
GLOBULIN, TOTAL: 2.9 g/dL (ref 1.5–4.5)
Glucose: 105 mg/dL — ABNORMAL HIGH (ref 65–99)
POTASSIUM: 4.1 mmol/L (ref 3.5–5.2)
SODIUM: 147 mmol/L — AB (ref 134–144)
TOTAL PROTEIN: 7.2 g/dL (ref 6.0–8.5)

## 2015-12-30 LAB — PSA, TOTAL AND FREE
PROSTATE SPECIFIC AG, SERUM: 0.9 ng/mL (ref 0.0–4.0)
PSA FREE PCT: 28.9 %
PSA FREE: 0.26 ng/mL

## 2016-03-04 ENCOUNTER — Ambulatory Visit (INDEPENDENT_AMBULATORY_CARE_PROVIDER_SITE_OTHER): Payer: Medicare Other | Admitting: Family Medicine

## 2016-03-04 ENCOUNTER — Encounter: Payer: Self-pay | Admitting: Family Medicine

## 2016-03-04 DIAGNOSIS — R51 Headache: Secondary | ICD-10-CM | POA: Diagnosis not present

## 2016-03-04 DIAGNOSIS — R519 Headache, unspecified: Secondary | ICD-10-CM | POA: Insufficient documentation

## 2016-03-04 HISTORY — DX: Headache, unspecified: R51.9

## 2016-03-04 MED ORDER — GABAPENTIN 600 MG PO TABS: 600.0000 mg | ORAL_TABLET | Freq: Two times a day (BID) | ORAL | 1 refills | Status: DC

## 2016-03-04 NOTE — Progress Notes (Signed)
Subjective:    Patient ID: John Norton, male    DOB: 11/25/1955, 61 y.o.   MRN: [id number]  HPI Patient here today to discuss headaches.Headaches are described as a burning in the frontal temporal areas bilateral. He had these symptoms previously and was started on Lyrica. This seems to have helped. He is on generous doses of Lyrica at 600 mg. He denies congestion or pressure in the sinuses.    Patient Active Problem List   Diagnosis Date Noted  . Parkinson's disease ([place]) 12/30/2015  . Essential tremor 12/30/2015   Outpatient Encounter Prescriptions as of 03/04/2016  Medication Sig  . Amantadine HCl 100 MG tablet Take 1 tablet by mouth 3 (three) times daily.  . Ascorbic Acid ([employer]S VITAMIN C) 1000 MG CHEW Chew 1-2 each by mouth every morning.  Marland Kitchen aspirin 81 MG EC tablet Take 81 mg by mouth daily.    Marland Kitchen Bioflavonoid Products (VITAMIN C) CHEW Chew 1 tablet by mouth every morning.  . carbidopa-levodopa (SINEMET) 25-100 MG per tablet Take 0.5 tablets by mouth 4 (four) times daily. Takes three to four times daily  . cholecalciferol (VITAMIN D) 1000 UNITS tablet Take 1,000 Units by mouth every morning.  . Cyanocobalamin (VITAMIN B-12 PO) Take 5,000 Units by mouth every morning.  . Multiple Vitamin (MULTIVITAMIN WITH MINERALS) TABS Take 1 tablet by mouth every morning.  . Omega-3 Fatty Acids (FISH OIL) 1200 MG CAPS Take 1 capsule by mouth every morning.  . pregabalin (LYRICA) 200 MG capsule Take 1 capsule (200 mg total) by mouth 3 (three) times daily.  . propranolol (INDERAL) 60 MG tablet Take 60 mg by mouth daily.  . selegiline (ELDEPRYL) 5 MG capsule Take 1 capsule by mouth 2 (two) times daily.  . sildenafil (VIAGRA) 100 MG tablet Take 100 mg by mouth daily as needed for erectile dysfunction.   . [DISCONTINUED] diphenhydramine-acetaminophen (TYLENOL PM) 25-500 MG TABS Take 2 tablets by mouth once as needed (pain/sleep.).   . [DISCONTINUED] nitroGLYCERIN (NITROSTAT) 0.4 MG SL tablet Place  0.4 mg under the tongue every 5 (five) minutes as needed for chest pain.    No facility-administered encounter medications on file as of 03/04/2016.       Review of Systems  Constitutional: Negative.   HENT: Negative.   Eyes: Negative.   Respiratory: Negative.   Cardiovascular: Negative.   Gastrointestinal: Negative.   Endocrine: Negative.   Genitourinary: Negative.   Musculoskeletal: Negative.   Skin: Negative.   Allergic/Immunologic: Negative.   Neurological: Positive for headaches.  Hematological: Negative.   Psychiatric/Behavioral: Negative.        Objective:   Physical Exam  Constitutional: He appears well-developed and well-nourished.  HENT:  Head: Normocephalic.  There is no tenderness to palpation or percussion over the frontotemporal areas. There are no rashes and no other skin changes. Sinuses are not tender to palpation or percussion.    BP 135/68   Pulse (!) 56   Temp 97.2 F (36.2 C) (Oral)   Ht 6' (1.829 m)   Wt 219 lb (99.3 kg)   BMI 29.70 kg/m        Assessment & Plan:  1. Nonintractable episodic headache, unspecified headache type Symptoms are not typical of classic headache or migraine. Really sound more like neuralgia especially in view of the fact that they are burning and responded at least initially to Lyrica. Discussed conversion over to gabapentin but had increased dose. We'll begin 600 mg twice a day of gabapentin while stopping  Lyrica. Will likely need to titrate gabapentin upward. Also recommended over-the-counter fibro as a potential topical therapy  Wardell Honour MD

## 2016-03-09 ENCOUNTER — Telehealth: Payer: Self-pay | Admitting: Family Medicine

## 2016-03-09 DIAGNOSIS — R519 Headache, unspecified: Secondary | ICD-10-CM

## 2016-03-09 DIAGNOSIS — M792 Neuralgia and neuritis, unspecified: Secondary | ICD-10-CM

## 2016-03-09 DIAGNOSIS — R51 Headache: Principal | ICD-10-CM

## 2016-03-09 NOTE — Telephone Encounter (Signed)
Patient would like referral. Referral placed.

## 2016-03-09 NOTE — Telephone Encounter (Signed)
I would go ahead and take to gabapentin 2 or 3 times a day. If that does not help referral to neurologist who might be the next step

## 2016-03-09 NOTE — Telephone Encounter (Signed)
Patient states that he has been taking Gabapenin 1 1/2 tablet TID and it is not helping that much with his nerve pain. Patient would like to know if he can take more or what you would advise him to do?

## 2016-03-09 NOTE — Telephone Encounter (Signed)
lmtcb

## 2016-03-14 NOTE — Telephone Encounter (Signed)
Per pt he is taking Gabapentin 1200mg  TID Please review and advise

## 2016-03-15 ENCOUNTER — Encounter: Payer: Self-pay | Admitting: Family Medicine

## 2016-03-15 ENCOUNTER — Ambulatory Visit (INDEPENDENT_AMBULATORY_CARE_PROVIDER_SITE_OTHER): Payer: Medicare Other | Admitting: Family Medicine

## 2016-03-15 VITALS — BP 160/75 | HR 52 | Temp 96.7°F | Ht 73.83 in | Wt 218.2 lb

## 2016-03-15 DIAGNOSIS — R519 Headache, unspecified: Secondary | ICD-10-CM

## 2016-03-15 DIAGNOSIS — R51 Headache: Secondary | ICD-10-CM

## 2016-03-15 MED ORDER — GABAPENTIN 600 MG PO TABS: 1200.0000 mg | ORAL_TABLET | Freq: Three times a day (TID) | ORAL | 5 refills | Status: DC

## 2016-03-15 NOTE — Telephone Encounter (Signed)
Appt made for today d/t pt having only one dosage left & Dr. Sabra Heck will not be back till Thursday

## 2016-03-15 NOTE — Progress Notes (Signed)
   HPI  Patient presents today here to follow-up for neuralgia.  Patient has had a unique headache that appears to be neuralgia of the forehead. He had response to 1200 mg of gabapentin and so started taking 1200 mg 3 times daily. He states that he has very mild sleepiness with the medication but overall is tolerating very well. He states the side effects are minimal and he feels that the medication is very effective. He describes it as "90% effective".  He needs refills as he has run out early taking higher doses of medications.  He is doing well with Parkinson's meds.  PMH: Smoking status noted ROS: Per HPI  Objective: BP (!) 160/75   Pulse (!) 52   Temp (!) 96.7 F (35.9 C) (Oral)   Ht 6' 1.83" (1.875 m)   Wt 218 lb 3.2 oz (99 kg)   BMI 28.15 kg/m  Gen: NAD, alert, cooperative with exam HEENT: NCAT CV: RRR, good S1/S2, no murmur Resp: CTABL, no wheezes, non-labored Ext: No edema, warm Neuro: Alert and oriented, pill rolling tremor bilaterally, masklike facies  Assessment and plan:  # Non-intractable episodic headache, neuralgia Patient with good successful treatment at 1200 mg of gabapentin 3 times daily, I explained this is the maximum dose. He denies any side effects that are bothersome. Refilled gabapentin 2 pills 3 times daily 6 months. Recommended keeping follow-up with neurology. Return to clinic with any concerns, discussed very carefully not to increase his current dose, but rather consider additional medications if needed. States for 10+ years Lyrica was very effective, then suddenly quit working, he is concerned that gabapentin will do the same.    Meds ordered this encounter  Medications  . gabapentin (NEURONTIN) 600 MG tablet    Sig: Take 2 tablets (1,200 mg total) by mouth 3 (three) times daily. As directed    Dispense:  180 tablet    Refill:  [place], MD [facility] 03/15/2016, 3:48 PM

## 2016-03-15 NOTE — Telephone Encounter (Signed)
Pt has appt, will discuss then.   Laroy Apple, MD [facility] 03/15/2016, 12:26 PM

## 2016-03-15 NOTE — Patient Instructions (Signed)
Great to meet you!  I have sent in 6 months supply of gabapentin at 2 pills three times a day. That is the maximum dose,  If it is not effective come back so we can discuss alternatives.

## 2016-03-16 NOTE — Telephone Encounter (Signed)
Okay to refill gabapentin 600 mg tid

## 2016-04-11 ENCOUNTER — Encounter: Payer: Self-pay | Admitting: Gastroenterology

## 2016-05-06 ENCOUNTER — Ambulatory Visit (INDEPENDENT_AMBULATORY_CARE_PROVIDER_SITE_OTHER): Payer: Medicare Other | Admitting: Neurology

## 2016-05-06 ENCOUNTER — Encounter: Payer: Self-pay | Admitting: Neurology

## 2016-05-06 VITALS — BP 120/84 | HR 55 | Ht 72.0 in | Wt 208.3 lb

## 2016-05-06 DIAGNOSIS — M792 Neuralgia and neuritis, unspecified: Secondary | ICD-10-CM

## 2016-05-06 MED ORDER — NORTRIPTYLINE HCL 10 MG PO CAPS: ORAL_CAPSULE | ORAL | 5 refills | Status: DC

## 2016-05-06 NOTE — Patient Instructions (Addendum)
1.  Start nortriptyline 10mg  at bedtime for 2 week, then increase to 2 tablet at bedtime 2.  Monitor for signs of flushing, increased heart rate, agitation, or fever.  If you develop this, stop the medication immediately. 3.  Continue Lyrica 200mg  three times daily 4.  Call with an update 1 month  Return to clinic in 3 months

## 2016-05-06 NOTE — Progress Notes (Signed)
[facility] Clinic Note - Initial Visit   Date: 05/06/16  NUMA SCHROETER MRN: [id number] DOB: 01/22/1956   Dear Dr. Sabra Heck:  Thank you for your kind referral of John Norton for consultation of headache. Although his history is well known to you, please allow John Norton to reiterate it for the purpose of our medical record. The patient was accompanied to the clinic by self.    History of Present Illness: John Norton is a 61 y.o. right-handed Caucasian male with idiopathic parkinson's disease and essential tremor (followed by Dr. Linus Mako, [facility]) presenting for evaluation of headache.    Starting around 2005, he started having burning paresthesias of the forehead and was evaluated by neurology in [facility].  He was diagnosed with neuralgia and given gabapentin without relief and switched to Lyrica.  He had MRI brain which was normal and tried nerve blocks with variable relief. He remained on Lyrica 200mg  three times daily for many years which controlled his symptoms.  In January 2018, he suddenly developed worsening burning sensation over the forehead, worse on the right. Symptoms are constant and annoying with sporadic burning intense pain. Pain is ranked as 3-4/10.  He tried gabapentin 3600mg /d but did not appreciate any relief, so went back to taking Lyrica 200mg  TID.  For the first week, his symptoms were well-controlled, but then started to get worse again. He has about 80% relief with Lyrica.  He denies any headache, vision changes, changes in taste, numbness/tingling, or weakness. He denies swelling of the eye, scleral injection, or rhinitis.  Out-side paper records, electronic medical record, and images have been reviewed where available and summarized as:  Lab Results  Component Value Date   TSH 0.746 08/22/2010   Lab Results  Component Value Date   HGBA1C 5.9 (H) 08/21/2010    Past Medical History:  Diagnosis Date  . ED (erectile  dysfunction)   . Kidney stone   . LBBB (left bundle branch block)   . Neuropathy (HCC)    trigeminal pain  . Parkinson's disease   . Plantar fasciitis 2017  . Sleep apnea    does not use cpap    Past Surgical History:  Procedure Laterality Date  . "spot on lung"    . CHEST X-RAY  08/21/10   No cardiopulmonary process noted.  . CHOLECYSTECTOMY N/A 04/20/2012   Procedure: LAPAROSCOPIC CHOLECYSTECTOMY;  Surgeon: Jamesetta So, MD;  Location: [facility];  Service: General;  Laterality: N/A;  . [place]  . LEXISCAN MYOVIEW  08/22/10    There is no stress-induced ischemia,  but a fixed deficit in the lateral apex and inferior wall.  Ejection fraction 51%  . THYROID LOBECTOMY     RIGHT THYROID     Medications:  Outpatient Encounter Prescriptions as of 05/06/2016  Medication Sig Note  . Amantadine HCl 100 MG tablet Take 1 tablet by mouth 3 (three) times daily. 06/30/2015: Received from: [facility]:   . Ascorbic Acid ([facility] VITAMIN C) 1000 MG CHEW Chew 1-2 each by mouth every morning.   Marland Kitchen aspirin 81 MG EC tablet Take 81 mg by mouth daily.     Marland Kitchen Bioflavonoid Products (VITAMIN C) CHEW Chew 1 tablet by mouth every morning.   . carbidopa-levodopa (SINEMET) 25-100 MG per tablet Take 0.5 tablets by mouth 4 (four) times daily. Takes three to four times daily   . cholecalciferol (VITAMIN D) 1000 UNITS tablet Take 1,000  Units by mouth every morning.   . Cyanocobalamin (VITAMIN B-12 PO) Take 5,000 Units by mouth every morning.   . Multiple Vitamin (MULTIVITAMIN WITH MINERALS) TABS Take 1 tablet by mouth every morning.   . Omega-3 Fatty Acids (FISH OIL) 1200 MG CAPS Take 1 capsule by mouth every morning.   . pregabalin (LYRICA) 200 MG capsule Take 200 mg by mouth 3 (three) times daily.   . propranolol (INDERAL) 60 MG tablet Take 60 mg by mouth daily.   . selegiline (ELDEPRYL) 5 MG capsule Take 1 capsule by mouth 2 (two) times daily. 06/30/2015: Received  from: [facility] Received Sig:   . sildenafil (VIAGRA) 100 MG tablet Take 100 mg by mouth daily as needed for erectile dysfunction.    . gabapentin (NEURONTIN) 600 MG tablet Take 2 tablets (1,200 mg total) by mouth 3 (three) times daily. As directed (Patient not taking: Reported on 05/06/2016)    No facility-administered encounter medications on file as of 05/06/2016.    Allergies:  Allergies  Allergen Reactions  . Codeine Nausea Only    Family History: Family History  Problem Relation Age of Onset  . Colon polyps Brother   . Colon cancer Neg Hx   . Rectal cancer Neg Hx   . Stomach cancer Neg Hx   . Esophageal cancer Neg Hx     Social History: Social History  Substance Use Topics  . Smoking status: Former Smoker    Packs/day: 0.25    Years: 40.00    Types: Cigarettes    Quit date: 07/30/2010  . Smokeless tobacco: Current User    Types: Chew  . Alcohol use No   Social History   Social History Narrative   Lives with wife in a one story home.  Has one daughter.  On disability.  Education: 12th    Review of Systems:  CONSTITUTIONAL: No fevers, chills, night sweats, or weight loss.   EYES: No visual changes or eye pain ENT: No hearing changes.  No history of nose bleeds.   RESPIRATORY: No cough, wheezing and shortness of breath.   CARDIOVASCULAR: Negative for chest pain, and palpitations.   GI: Negative for abdominal discomfort, blood in stools or black stools.  No recent change in bowel habits.   GU:  No history of incontinence.   MUSCLOSKELETAL: No history of joint pain or swelling.  No myalgias.   SKIN: Negative for lesions, rash, and itching.   HEMATOLOGY/ONCOLOGY: Negative for prolonged bleeding, bruising easily, and swollen nodes.  No history of cancer.   ENDOCRINE: Negative for cold or heat intolerance, polydipsia or goiter.   PSYCH:  No depression or anxiety symptoms.   NEURO: As Above.   Vital Signs:  BP 120/84   Pulse (!) 55   Ht 6'  (1.829 m)   Wt 208 lb 5 oz (94.5 kg)   SpO2 95%   BMI 28.25 kg/m    General Medical Exam:   General:  Well appearing, comfortable.   Eyes/ENT: see cranial nerve examination.   Neck: No masses appreciated.  Full range of motion without tenderness.  No carotid bruits. Respiratory:  Clear to auscultation, good air entry bilaterally.   Cardiac:  Regular rate and rhythm, no murmur.   Extremities:  No deformities, edema, or skin discoloration.  Skin:  No rashes or lesions.  Neurological Exam: MENTAL STATUS including orientation to time, place, person, recent and remote memory, attention span and concentration, language, and fund of knowledge is normal.  Speech is not dysarthric.  Severely blunted affect.  CRANIAL NERVES: II:  No visual field defects.  Unremarkable fundi.   III-IV-VI: Pupils equal round and reactive to light.  Normal conjugate, extra-ocular eye movements in all directions of gaze.  No nystagmus.  No ptosis.   V:  Normal facial sensation.   VII:  Normal facial symmetry and movements.   VIII:  Normal hearing and vestibular function.   IX-X:  Normal palatal movement.   XI:  Normal shoulder shrug and head rotation.   XII:  Normal tongue strength and range of motion, no deviation or fasciculation.  MOTOR:  No atrophy, fasciculations or abnormal movements.  No pronator drift.  Increased tone in the LUE > LLE  Right Upper Extremity:    Left Upper Extremity:    Deltoid  5/5   Deltoid  5/5   Biceps  5/5   Biceps  5/5   Triceps  5/5   Triceps  5/5   Wrist extensors  5/5   Wrist extensors  5/5   Wrist flexors  5/5   Wrist flexors  5/5   Finger extensors  5/5   Finger extensors  5/5   Finger flexors  5/5   Finger flexors  5/5   Dorsal interossei  5/5   Dorsal interossei  5/5   Abductor pollicis  5/5   Abductor pollicis  5/5    Right Lower Extremity:    Left Lower Extremity:    Hip flexors  5/5   Hip flexors  5/5   Hip extensors  5/5   Hip extensors  5/5   Knee flexors  5/5    Knee flexors  5/5   Knee extensors  5/5   Knee extensors  5/5   Dorsiflexors  5/5   Dorsiflexors  5/5   Plantarflexors  5/5   Plantarflexors  5/5   Toe extensors  5/5   Toe extensors  5/5   Toe flexors  5/5   Toe flexors  5/5    MSRs:  Right                                                                 Left brachioradialis 2+  brachioradialis 2+  biceps 2+  biceps 2+  triceps 2+  triceps 2+  patellar 2+  patellar 2+  ankle jerk 2+  ankle jerk 2+  Hoffman no  Hoffman no  plantar response down  plantar response down   SENSORY:  Normal and symmetric perception of light touch, pinprick, vibration, and proprioception.  Romberg's sign absent.   COORDINATION/GAIT: Normal finger-to- nose-finger and heel-to-shin.  Bradykinesia with finger tapping and heel tapping on the left > right.  Able to rise from a chair without using arms.  Gait narrow based and stable, slow, unassisted. Tandem and stressed gait intact.    IMPRESSION: Mr. Steiner is a 61 year-old gentleman referred for evaluation of atypical burning paresthesia of the forehead.  Symptoms have been present for many years and were well-controlled on Lyrica 200mg  TID, but starting in January, his began having breakthrough pain.  With the chronicity of symptoms and absence of worrisome features on his exam, I do not see any reason to warrant imaging.  For symptom relief, I will add nortriptyline.  Since he is taking selegiline, I informed him of drug-drug interaction and signs to monitor for.  At such a low dose of the medication, I feel safe with him starting nortriptyline 10mg  at bedtime and titrate to 20mg  after two weeks.  He tried gabapentin 3600mg /d with no benefit.  Call with update in 1 month Return to clinic in 3 months   The duration of this appointment visit was 40 minutes of face-to-face time with the patient.  Greater than 50% of this time was spent in counseling, explanation of diagnosis, planning of further management, and  coordination of care.   Thank you for allowing me to participate in patient's care.  If I can answer any additional questions, I would be pleased to do so.    Sincerely,    Shaqueena Mauceri K. Posey Pronto, DO

## 2016-05-10 ENCOUNTER — Other Ambulatory Visit: Payer: Self-pay | Admitting: Family Medicine

## 2016-05-23 DIAGNOSIS — G20 Parkinson's disease: Secondary | ICD-10-CM | POA: Diagnosis not present

## 2016-05-23 DIAGNOSIS — R251 Tremor, unspecified: Secondary | ICD-10-CM | POA: Diagnosis not present

## 2016-06-03 ENCOUNTER — Ambulatory Visit (INDEPENDENT_AMBULATORY_CARE_PROVIDER_SITE_OTHER): Payer: Medicare Other | Admitting: Family Medicine

## 2016-06-03 ENCOUNTER — Ambulatory Visit (INDEPENDENT_AMBULATORY_CARE_PROVIDER_SITE_OTHER): Payer: Medicare Other

## 2016-06-03 ENCOUNTER — Encounter: Payer: Self-pay | Admitting: Family Medicine

## 2016-06-03 VITALS — BP 128/66 | HR 57 | Temp 97.4°F | Ht 72.0 in | Wt 209.0 lb

## 2016-06-03 DIAGNOSIS — M539 Dorsopathy, unspecified: Secondary | ICD-10-CM

## 2016-06-03 DIAGNOSIS — M419 Scoliosis, unspecified: Secondary | ICD-10-CM

## 2016-06-03 DIAGNOSIS — M5386 Other specified dorsopathies, lumbar region: Secondary | ICD-10-CM

## 2016-06-03 MED ORDER — PREDNISONE 10 MG PO TABS: ORAL_TABLET | ORAL | 0 refills | Status: DC

## 2016-06-03 NOTE — Progress Notes (Signed)
Subjective:  Patient ID: TALTON DELPRIORE, male    DOB: 1955/05/08  Age: 61 y.o. MRN: [id number]  CC: Leg Pain (pt here today c/o right leg pain that starts at his lower back and radiates down his leg)   HPI CHUCKY HOMES presents for Increasing pain down the posterior left buttocks and thigh and into the calf. Yesterday was a particularly bad day. Pain frequently runs 7-8/10. It has been ongoing for couple of years. Over the last couple months it's started increasing significantly in frequency and severity. At first it was just a little bit of a nagging ache for a short time after doing something strenuous that involved a lot of bending or lifting. Yesterday however he was shoveling mulch and the pain peaked at that time. However, it's not always necessary for him to be exerting himself anymore. It lasts several hours at a time. 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  History [place] has a past medical history of ED (erectile dysfunction); Kidney stone; LBBB (left bundle branch block); Neuropathy; Parkinson's disease; Plantar fasciitis (2017); and Sleep apnea.   He has a past surgical history that includes Thyroid lobectomy; LEXISCAN MYOVIEW (08/22/10); CHEST X-RAY (08/21/10); Inguinal hernia repair (1998); "spot on lung"; and Cholecystectomy (N/A, 04/20/2012).   His family history includes Colon polyps in his brother.He reports that he quit smoking about 5 years ago. His smoking use included Cigarettes. He has a 10.00 pack-year smoking history. His smokeless tobacco use includes Chew. He reports that he does not drink alcohol or use drugs.  Current Outpatient Prescriptions on File Prior to Visit  Medication Sig Dispense Refill  . Amantadine HCl 100 MG tablet Take 1 tablet by mouth 3 (three) times daily.    . Ascorbic Acid ([employer] VITAMIN C) 1000 MG CHEW Chew 1-2 each by mouth every morning.    Marland Kitchen aspirin 81 MG EC tablet Take 81 mg by mouth daily.      . carbidopa-levodopa  (SINEMET) 25-100 MG per tablet Take 0.5 tablets by mouth 4 (four) times daily. Takes three to four times daily    . cholecalciferol (VITAMIN D) 1000 UNITS tablet Take 1,000 Units by mouth every morning.    . Cyanocobalamin (VITAMIN B-12 PO) Take 5,000 Units by mouth every morning.    Marland Kitchen LYRICA 200 MG capsule TAKE ONE CAPSULE BY MOUTH 3 TIMES A DAY 90 capsule 1  . Multiple Vitamin (MULTIVITAMIN WITH MINERALS) TABS Take 1 tablet by mouth every morning.    . nortriptyline (PAMELOR) 10 MG capsule Start nortriptyline 10mg  at bedtime for 2 week, then increase to 2 tablet at bedtime 60 capsule 5  . Omega-3 Fatty Acids (FISH OIL) 1200 MG CAPS Take 1 capsule by mouth every morning.    . propranolol (INDERAL) 60 MG tablet Take 60 mg by mouth daily.    . selegiline (ELDEPRYL) 5 MG capsule Take 1 capsule by mouth 2 (two) times daily.    . sildenafil (VIAGRA) 100 MG tablet Take 100 mg by mouth daily as needed for erectile dysfunction.     Marland Kitchen Bioflavonoid Products (VITAMIN C) CHEW Chew 1 tablet by mouth every morning.     No current facility-administered medications on file prior to visit.     ROS Review of Systems  Constitutional: Negative for chills, diaphoresis and fever.  HENT: Negative for sore throat.   Respiratory: Negative.   Cardiovascular: Negative for chest pain.  Gastrointestinal: Negative for abdominal pain.  Musculoskeletal: Positive  for arthralgias, back pain, gait problem and myalgias. Negative for neck pain.  Skin: Negative for rash.  Neurological: Positive for weakness. Negative for numbness.    Objective:  BP 128/66   Pulse (!) 57   Temp 97.4 F (36.3 C) (Oral)   Ht 6' (1.829 m)   Wt 209 lb (94.8 kg)   BMI 28.35 kg/m   Physical Exam  Constitutional: He is oriented to person, place, and time. He appears well-developed and well-nourished. He appears distressed.  HENT:  Head: Normocephalic.  Eyes: Pupils are equal, round, and reactive to light.  Neck: Normal range of motion.   Cardiovascular: Normal rate, regular rhythm and normal heart sounds.   No murmur heard. Pulmonary/Chest: Effort normal and breath sounds normal.  Abdominal: There is no tenderness.  Musculoskeletal: He exhibits tenderness (at the midline lower lumbar musculature.).  Neurological: He is alert and oriented to person, place, and time. He has normal reflexes.  Skin: Skin is warm and dry.  Psychiatric: His behavior is normal. Thought content normal.  Vitals reviewed.   Assessment & Plan:   Daimien was seen today for leg pain.  Diagnoses and all orders for this visit:  Sciatica associated with disorder of lumbar spine -     DG Lumbar Spine 2-3 Views; Future -     DG HIP UNILAT W OR W/O PELVIS 2-3 VIEWS LEFT; Future -     Ambulatory referral to Orthopedics  Scoliosis of lumbar spine, unspecified scoliosis type -     Ambulatory referral to Orthopedics  Other orders -     predniSONE (DELTASONE) 10 MG tablet; Take 5 daily for 3 days followed by 4,3,2 and 1 for 3 days each.   I am having Mr. Leicht start on predniSONE. I am also having him maintain his aspirin, carbidopa-levodopa, sildenafil, Fish Oil, multivitamin with minerals, Vitamin C, Ascorbic Acid, cholecalciferol, Cyanocobalamin (VITAMIN B-12 PO), Amantadine HCl, selegiline, propranolol, nortriptyline, and LYRICA.  Meds ordered this encounter  Medications  . predniSONE (DELTASONE) 10 MG tablet    Sig: Take 5 daily for 3 days followed by 4,3,2 and 1 for 3 days each.    Dispense:  45 tablet    Refill:  0   LS Spine & DG hip show significant degenerative changes. Spine shows DDD & spurring L4-5, disc space narrowing L5-s1  Follow-up: No Follow-up on file.  Claretta Fraise, M.D.

## 2016-06-14 DIAGNOSIS — G8929 Other chronic pain: Secondary | ICD-10-CM | POA: Diagnosis not present

## 2016-06-14 DIAGNOSIS — M5441 Lumbago with sciatica, right side: Secondary | ICD-10-CM | POA: Diagnosis not present

## 2016-06-21 ENCOUNTER — Encounter: Payer: Self-pay | Admitting: Physical Therapy

## 2016-06-21 ENCOUNTER — Ambulatory Visit: Payer: Medicare Other | Attending: Chiropractic Medicine | Admitting: Physical Therapy

## 2016-06-21 DIAGNOSIS — G8929 Other chronic pain: Secondary | ICD-10-CM

## 2016-06-21 DIAGNOSIS — M79604 Pain in right leg: Secondary | ICD-10-CM

## 2016-06-21 DIAGNOSIS — M5440 Lumbago with sciatica, unspecified side: Secondary | ICD-10-CM | POA: Diagnosis not present

## 2016-06-21 DIAGNOSIS — R262 Difficulty in walking, not elsewhere classified: Secondary | ICD-10-CM | POA: Diagnosis not present

## 2016-06-21 NOTE — Patient Instructions (Addendum)
   Perform 3-5 on each leg, hold 30 seconds each 2 times a day      Lift bottom up and hold 5 seconds, 10-15 times 2 times a day    Pull leg toward your chest, hold 20 seconds, repeat 3 times 2 times a day

## 2016-06-22 NOTE — Therapy (Signed)
[facility] [place], [place], [address] Phone: [phone]   Fax:  [phone]  Physical Therapy Evaluation  Patient Details  Name: John Norton MRN: [id number] Date of Birth: 01/20/1956 Referring Provider: Levy Pupa, PA  Encounter Date: 06/21/2016      PT End of Session - 06/21/16 4403    Visit Number 1   Number of Visits 24   Authorization Type UHC and Medicare, KX modifier   PT Start Time 0900   PT Stop Time 0950   PT Time Calculation (min) 50 min   Activity Tolerance Patient tolerated treatment well   Behavior During Therapy [facility] for tasks assessed/performed      Past Medical History:  Diagnosis Date  . ED (erectile dysfunction)   . Kidney stone   . LBBB (left bundle branch block)   . Neuropathy    trigeminal pain  . Parkinson's disease   . Plantar fasciitis 2017  . Sleep apnea    does not use cpap    Past Surgical History:  Procedure Laterality Date  . "spot on lung"    . CHEST X-RAY  08/21/10   No cardiopulmonary process noted.  . CHOLECYSTECTOMY N/A 04/20/2012   Procedure: LAPAROSCOPIC CHOLECYSTECTOMY;  Surgeon: Jamesetta So, MD;  Location: [facility];  Service: General;  Laterality: N/A;  . [place]  . LEXISCAN MYOVIEW  08/22/10    There is no stress-induced ischemia,  but a fixed deficit in the lateral apex and inferior wall.  Ejection fraction 51%  . THYROID LOBECTOMY     RIGHT THYROID    There were no vitals filed for this visit.       Subjective Assessment - 06/21/16 0917    Subjective Pt arriving to therapy reporting R LE pain. Pt reporting he started predisone a few weeks ago which seemed to help, but since he stopped the pain has gotton worse. Pt reported that the pain began a few years ago and gradullay has gotten worse.    How long can you sit comfortably? 30 minutes   How long can you stand comfortably? standing hurts   How long can you walk comfortably?  wallking hurts   Diagnostic tests X-ray   Patient Stated Goals Walk without hurting   Currently in Pain? Yes   Pain Score 2   can increase up to 8/10   Pain Location Leg   Pain Orientation Right   Pain Descriptors / Indicators Aching   Pain Type Chronic pain   Pain Onset More than a month ago   Pain Frequency Intermittent   Aggravating Factors  standing, walking   Pain Relieving Factors resting, predisone   Effect of Pain on Daily Activities difficulty standing and walking                               PT Education - 06/21/16 0921    Education provided Yes   Education Details HEP, safety   Person(s) Educated Patient   Methods Explanation;Demonstration;Handout;Tactile cues   Comprehension Verbalized understanding;Returned demonstration          PT Short Term Goals - 06/22/16 0930      PT SHORT TERM GOAL #1   Title Pt will be independent in his HEP   Time 4   Period Weeks   Status New           PT Long Term Goals -  07-16-16 0930      PT LONG TERM GOAL #1   Title Pt will improve his FOTO score from 47% limitation to 39% limitation in order to improve functional mobility.   Time 8   Period Weeks   Status New     PT LONG TERM GOAL #2   Title Pt will improve his R LE strength to 5/5 in order to improve transfers and gait.    Time 8   Period Weeks   Status New     PT LONG TERM GOAL #3   Title Pt will be able to amb 500 feet on sidewalk surfaces with pain </=2/10 in his R LE.    Time 8   Period Weeks   Status New               Plan - 16-Jul-2016 6010    Clinical Impression Statement Patient presenting today to clinic as a moderate complexity evaluation complaining of  2/10 R LE nerve pain at rest which increases with walking and can increase to 8-9/10. Pt believes pain originated from his low back. Pt reporting his last X-ray revealed bone on bone in his lumbar spine. Pt reporting his pain has increased the last few months and he has  difficulty walking. Pt presenting with 4/5 R LE strength compared to 5/5 L LE strength. Straight leg raise was measured: R=58degrees flexion, L=67 degrees flexion. Upon arriving to clinic and when pt was called back to a treatment room pt experienced a LOB posteriorly only to be saved my the chair behind him. Pt amb with R LE internal rotation of the hip/knee/ankle which increases pt's risk of falls. Pt also presenting with decreased foot clearance on the R LE. Pt with a dx of Parkinson's and presents with forward head and shoulders and mild flexion posture during gait. Skilled PT needed to address pt's LE pain, weakness, and decreased balance with the below interventions.    Rehab Potential Good   Clinical Impairments Affecting Rehab Potential Parkinson's Disease    PT Frequency 2x / week   PT Duration 8 weeks   PT Treatment/Interventions ADLs/Self Care Home Management;Patient/family education;Cryotherapy;Electrical Stimulation;Moist Heat;Traction;Ultrasound;Neuromuscular re-education;Balance training;Therapeutic exercise;Therapeutic activities;Functional mobility training;Stair training;Gait training;Manual techniques;Passive range of motion;Dry needling;Taping   PT Next Visit Plan Perform BERG and create new balance goal,  hamstring streching, mechanical traction   PT Home Exercise Plan Hamstring streches, bridging   Consulted and Agree with Plan of Care Patient      Patient will benefit from skilled therapeutic intervention in order to improve the following deficits and impairments:  Abnormal gait, Difficulty walking, Pain, Decreased strength, Decreased balance, Impaired flexibility, Decreased range of motion, Decreased safety awareness, Decreased activity tolerance  Visit Diagnosis: Pain in right leg  Chronic right-sided low back pain with sciatica, sciatica laterality unspecified  Difficulty in walking, not elsewhere classified      G-Codes - 07/16/2016 0934    Functional Assessment Tool  Used (Outpatient Only) FOTO, clinical assessment   Functional Limitation Mobility: Walking and moving around   Mobility: Walking and Moving Around Current Status [phone]) At least 40 percent but less than 60 percent impaired, limited or restricted   Mobility: Walking and Moving Around Goal Status [phone]) At least 20 percent but less than 40 percent impaired, limited or restricted       Problem List Patient Active Problem List   Diagnosis Date Noted  . Head pain cephalgia 03/04/2016  . Parkinson's disease ([place]) 12/30/2015  . Essential tremor  12/30/2015    Pt was evaluated on 06/21/16 and documentation was completed on 06/22/16.     Oretha Caprice, MPT 06/22/2016, 9:40 AM  [facility] [address] [place], [place], [address] Phone: [phone]   Fax:  [phone]  Name: MITSUGI SCHRADER MRN: [id number] Date of Birth: 07/26/1955

## 2016-06-24 ENCOUNTER — Encounter: Payer: Medicare Other | Admitting: Physical Therapy

## 2016-06-25 ENCOUNTER — Encounter: Payer: Self-pay | Admitting: Physician Assistant

## 2016-06-25 ENCOUNTER — Ambulatory Visit (INDEPENDENT_AMBULATORY_CARE_PROVIDER_SITE_OTHER): Payer: Medicare Other | Admitting: Physician Assistant

## 2016-06-25 VITALS — BP 113/70 | HR 73 | Temp 98.1°F | Ht 72.0 in | Wt 201.4 lb

## 2016-06-25 DIAGNOSIS — R05 Cough: Secondary | ICD-10-CM

## 2016-06-25 DIAGNOSIS — R059 Cough, unspecified: Secondary | ICD-10-CM

## 2016-06-25 DIAGNOSIS — J40 Bronchitis, not specified as acute or chronic: Secondary | ICD-10-CM

## 2016-06-25 MED ORDER — AZITHROMYCIN 250 MG PO TABS: ORAL_TABLET | ORAL | 0 refills | Status: DC

## 2016-06-25 MED ORDER — HYDROCODONE-HOMATROPINE 5-1.5 MG/5ML PO SYRP: 5.0000 mL | ORAL_SOLUTION | Freq: Four times a day (QID) | ORAL | 0 refills | Status: DC | PRN

## 2016-06-25 NOTE — Progress Notes (Signed)
BP 113/70   Pulse 73   Temp 98.1 F (36.7 C) (Oral)   Ht 6' (1.829 m)   Wt 201 lb 6 oz (91.3 kg)   BMI 27.31 kg/m    Subjective:    Patient ID: John Norton, male    DOB: 07-19-1955, 61 y.o.   MRN: [id number]  HPI: John Norton is a 61 y.o. male presenting on 06/25/2016 for Cough (x 1 month, worse in last few days, non-productive; worse at night)  One month ago started with cold and cough. This patient has had many days of sore throat and postnasal drainage, headache at times and sinus pressure. There is copious drainage at times. Denies any fever at this time. There has been a history of sinus infections in the past.  There is cough at night. It has become more prevalent in recent days.  Relevant past medical, surgical, family and social history reviewed and updated as indicated. Allergies and medications reviewed and updated.  Past Medical History:  Diagnosis Date  . ED (erectile dysfunction)   . Kidney stone   . LBBB (left bundle branch block)   . Neuropathy    trigeminal pain  . Parkinson's disease   . Plantar fasciitis 2017  . Sleep apnea    does not use cpap    Past Surgical History:  Procedure Laterality Date  . "spot on lung"    . CHEST X-RAY  08/21/10   No cardiopulmonary process noted.  . CHOLECYSTECTOMY N/A 04/20/2012   Procedure: LAPAROSCOPIC CHOLECYSTECTOMY;  Surgeon: Jamesetta So, MD;  Location: [facility];  Service: General;  Laterality: N/A;  . [place]  . LEXISCAN MYOVIEW  08/22/10    There is no stress-induced ischemia,  but a fixed deficit in the lateral apex and inferior wall.  Ejection fraction 51%  . THYROID LOBECTOMY     RIGHT THYROID    Review of Systems  Constitutional: Positive for fatigue. Negative for appetite change.  HENT: Positive for congestion, sinus pressure and sore throat.   Eyes: Negative.  Negative for pain and visual disturbance.  Respiratory: Positive for cough. Negative for chest tightness, shortness of  breath and wheezing.   Cardiovascular: Negative.  Negative for chest pain, palpitations and leg swelling.  Gastrointestinal: Negative.  Negative for abdominal pain, diarrhea, nausea and vomiting.  Endocrine: Negative.   Genitourinary: Negative.   Musculoskeletal: Negative for myalgias.  Skin: Negative.  Negative for color change and rash.  Neurological: Positive for headaches. Negative for weakness and numbness.  Psychiatric/Behavioral: Negative.     Allergies as of 06/25/2016      Reactions   Codeine Nausea Only      Medication List       Accurate as of 06/25/16  9:29 AM. Always use your most recent med list.          Amantadine HCl 100 MG tablet Take 1 tablet by mouth 3 (three) times daily.   aspirin 81 MG EC tablet Take 81 mg by mouth daily.   azithromycin 250 MG tablet Commonly known as:  ZITHROMAX Z-PAK Take as directed   carbidopa-levodopa 25-100 MG tablet Commonly known as:  SINEMET IR Take 0.5 tablets by mouth 4 (four) times daily. Takes three to four times daily   cholecalciferol 1000 units tablet Commonly known as:  VITAMIN D Take 1,000 Units by mouth every morning.   [employer] VITAMIN C 1000 MG Chew Generic drug:  Ascorbic Acid Chew 1-2 each by mouth  every morning.   Fish Oil 1200 MG Caps Take 1 capsule by mouth every morning.   HYDROcodone-homatropine 5-1.5 MG/5ML syrup Commonly known as:  HYCODAN Take 5 mLs by mouth every 6 (six) hours as needed for cough.   LYRICA 200 MG capsule Generic drug:  pregabalin TAKE ONE CAPSULE BY MOUTH 3 TIMES A DAY   multivitamin with minerals Tabs tablet Take 1 tablet by mouth every morning.   nortriptyline 10 MG capsule Commonly known as:  PAMELOR Start nortriptyline 10mg  at bedtime for 2 week, then increase to 2 tablet at bedtime   propranolol 60 MG tablet Commonly known as:  INDERAL Take 60 mg by mouth daily.   selegiline 5 MG capsule Commonly known as:  ELDEPRYL Take 1 capsule by mouth 2 (two) times daily.    sildenafil 100 MG tablet Commonly known as:  VIAGRA Take 100 mg by mouth daily as needed for erectile dysfunction.   VITAMIN B-12 PO Take 5,000 Units by mouth every morning.   Vitamin C Chew Chew 1 tablet by mouth every morning.          Objective:    BP 113/70   Pulse 73   Temp 98.1 F (36.7 C) (Oral)   Ht 6' (1.829 m)   Wt 201 lb 6 oz (91.3 kg)   BMI 27.31 kg/m   Allergies  Allergen Reactions  . Codeine Nausea Only    Physical Exam  Constitutional: He is oriented to person, place, and time. He appears well-developed and well-nourished. He appears distressed.  HENT:  Head: Normocephalic and atraumatic.  Right Ear: Tympanic membrane normal. No drainage. No middle ear effusion.  Left Ear: Tympanic membrane normal. No drainage.  No middle ear effusion.  Nose: Mucosal edema and rhinorrhea present. Right sinus exhibits no maxillary sinus tenderness. Left sinus exhibits no maxillary sinus tenderness.  Mouth/Throat: Uvula is midline. Posterior oropharyngeal erythema present. No oropharyngeal exudate.  Eyes: Conjunctivae and EOM are normal. Pupils are equal, round, and reactive to light. Right eye exhibits no discharge. Left eye exhibits no discharge.  Neck: Normal range of motion.  Cardiovascular: Normal rate, regular rhythm and normal heart sounds.   Pulmonary/Chest: Effort normal and breath sounds normal. No respiratory distress. He has no wheezes.  Abdominal: Soft.  Lymphadenopathy:    He has no cervical adenopathy.  Neurological: He is alert and oriented to person, place, and time.  Skin: Skin is warm and dry.  Psychiatric: He has a normal mood and affect. His behavior is normal.  Nursing note and vitals reviewed.       Assessment & Plan:   1. Cough - HYDROcodone-homatropine (HYCODAN) 5-1.5 MG/5ML syrup; Take 5 mLs by mouth every 6 (six) hours as needed for cough.  Dispense: 120 mL; Refill: 0 - azithromycin (ZITHROMAX Z-PAK) 250 MG tablet; Take as directed   Dispense: 6 each; Refill: 0  2. Bronchitis - HYDROcodone-homatropine (HYCODAN) 5-1.5 MG/5ML syrup; Take 5 mLs by mouth every 6 (six) hours as needed for cough.  Dispense: 120 mL; Refill: 0 - azithromycin (ZITHROMAX Z-PAK) 250 MG tablet; Take as directed  Dispense: 6 each; Refill: 0  ..edsc  Continue all other maintenance medications as listed above.  Follow up plan: Return if symptoms worsen or fail to improve.  Educational handout given for coumadin recheck  Terald Sleeper PA-C [facility] [address]  [place], [place] [address] [phone]   06/25/2016, 9:29 AM

## 2016-06-25 NOTE — Patient Instructions (Signed)
Bronchospasm, Adult Bronchospasm is a tightening of the airways going into the lungs. During an episode, it may be harder to breathe. You may cough, and you may make a whistling sound when you breathe (wheeze). This condition often affects people with asthma. What are the causes? This condition is caused by swelling and irritation in the airways. It can be triggered by:  An infection (common).  Seasonal allergies.  An allergic reaction.  Exercise.  Irritants. These include pollution, cigarette smoke, strong odors, aerosol sprays, and paint fumes.  Weather changes. Winds increase molds and pollens in the air. Cold air may cause swelling.  Stress and emotional upset.  What are the signs or symptoms? Symptoms of this condition include:  Wheezing. If the episode was triggered by an allergy, wheezing may start right away or hours later.  Nighttime coughing.  Frequent or severe coughing with a simple cold.  Chest tightness.  Shortness of breath.  Decreased ability to exercise.  How is this diagnosed? This condition is usually diagnosed with a review of your medical history and a physical exam. Tests, such as lung function tests, are sometimes done to look for other conditions. The need for a chest X-ray depends on where the wheezing occurs and whether it is the first time you have wheezed. How is this treated? This condition may be treated with:  Inhaled medicines. These open up the airways and help you breathe. They can be taken with an inhaler or a nebulizer device.  Corticosteroid medicines. These may be given for severe bronchospasm, usually when it is associated with asthma.  Avoiding triggers, such as irritants, infection, or allergies.  Follow these instructions at home: Medicines  Take over-the-counter and prescription medicines only as told by your health care provider.  If you need to use an inhaler or nebulizer to take your medicine, ask your health care  provider to explain how to use it correctly. If you were given a spacer, always use it with your inhaler. Lifestyle  Reduce the number of triggers in your home. To do this: ? Change your heating and air conditioning filter at least once a month. ? Limit your use of fireplaces and wood stoves. ? Do not smoke. Do not allow smoking in your home. ? Avoid using perfumes and fragrances. ? Get rid of pests, such as roaches and mice, and their droppings. ? Remove any mold from your home. ? Keep your house clean and dust free. Use unscented cleaning products. ? Replace carpet with wood, tile, or vinyl flooring. Carpet can trap dander and dust. ? Use allergy-proof pillows, mattress covers, and box spring covers. ? Wash bed sheets and blankets every week in hot water. Dry them in a dryer. ? Use blankets that are made of polyester or cotton. ? Wash your hands often. ? Do not allow pets in your bedroom.  Avoid breathing in cold air when you exercise. General instructions  Have a plan for seeking medical care. Know when to call your health care provider and local emergency services, and where to get emergency care.  Stay up to date on your immunizations.  When you have an episode of bronchospasm, stay calm. Try to relax and breathe more slowly.  If you have asthma, make sure you have an asthma action plan.  Keep all follow-up visits as told by your health care provider. This is important. Contact a health care provider if:  You have muscle aches.  You have chest pain.  The mucus that you   cough up (sputum) changes from clear or white to yellow, green, gray, or bloody.  You have a fever.  Your sputum gets thicker. Get help right away if:  Your wheezing and coughing get worse, even after you take your prescribed medicines.  It gets even harder to breathe.  You develop severe chest pain. Summary  Bronchospasm is a tightening of the airways going into the lungs.  During an episode of  bronchospasm, you may have a harder time breathing. You may cough and make a whistling sound when you breathe (wheeze).  Avoid exposure to triggers such as smoke, dust, mold, animal dander, and fragrances.  When you have an episode of bronchospasm, stay calm. Try to relax and breathe more slowly. This information is not intended to replace advice given to you by your health care provider. Make sure you discuss any questions you have with your health care provider. Document Released: 02/17/2003 Document Revised: 02/11/2016 Document Reviewed: 02/11/2016 Elsevier Interactive Patient Education  2017 [employer]vier Inc.  

## 2016-07-13 DIAGNOSIS — M5441 Lumbago with sciatica, right side: Secondary | ICD-10-CM | POA: Diagnosis not present

## 2016-08-01 ENCOUNTER — Telehealth: Payer: Self-pay | Admitting: Family Medicine

## 2016-08-04 DIAGNOSIS — M5416 Radiculopathy, lumbar region: Secondary | ICD-10-CM | POA: Diagnosis not present

## 2016-08-04 DIAGNOSIS — M5441 Lumbago with sciatica, right side: Secondary | ICD-10-CM | POA: Diagnosis not present

## 2016-08-04 DIAGNOSIS — G8929 Other chronic pain: Secondary | ICD-10-CM | POA: Diagnosis not present

## 2016-08-08 ENCOUNTER — Ambulatory Visit: Payer: Medicare Other | Admitting: Neurology

## 2016-08-16 ENCOUNTER — Other Ambulatory Visit: Payer: Self-pay | Admitting: *Deleted

## 2016-08-16 MED ORDER — PREGABALIN 200 MG PO CAPS
200.0000 mg | ORAL_CAPSULE | Freq: Three times a day (TID) | ORAL | 5 refills | Status: DC
Start: 2016-08-16 — End: 2016-11-03

## 2016-08-16 NOTE — Telephone Encounter (Signed)
Last filled 07/06/16, last seen 06/03/16. Call in

## 2016-08-16 NOTE — Telephone Encounter (Signed)
Please advise 

## 2016-08-18 ENCOUNTER — Ambulatory Visit: Payer: Medicare Other | Admitting: Neurology

## 2016-08-24 DIAGNOSIS — M5416 Radiculopathy, lumbar region: Secondary | ICD-10-CM | POA: Diagnosis not present

## 2016-09-07 DIAGNOSIS — M545 Low back pain: Secondary | ICD-10-CM | POA: Diagnosis not present

## 2016-09-17 DIAGNOSIS — M5441 Lumbago with sciatica, right side: Secondary | ICD-10-CM | POA: Diagnosis not present

## 2016-09-17 DIAGNOSIS — M5416 Radiculopathy, lumbar region: Secondary | ICD-10-CM | POA: Diagnosis not present

## 2016-09-17 DIAGNOSIS — G8929 Other chronic pain: Secondary | ICD-10-CM | POA: Diagnosis not present

## 2016-09-17 DIAGNOSIS — M48062 Spinal stenosis, lumbar region with neurogenic claudication: Secondary | ICD-10-CM | POA: Diagnosis not present

## 2016-09-26 DIAGNOSIS — M5441 Lumbago with sciatica, right side: Secondary | ICD-10-CM | POA: Diagnosis not present

## 2016-09-26 DIAGNOSIS — M5416 Radiculopathy, lumbar region: Secondary | ICD-10-CM | POA: Diagnosis not present

## 2016-09-26 DIAGNOSIS — G8929 Other chronic pain: Secondary | ICD-10-CM | POA: Diagnosis not present

## 2016-09-26 DIAGNOSIS — M5432 Sciatica, left side: Secondary | ICD-10-CM | POA: Diagnosis not present

## 2016-09-26 DIAGNOSIS — M48062 Spinal stenosis, lumbar region with neurogenic claudication: Secondary | ICD-10-CM | POA: Diagnosis not present

## 2016-09-30 DIAGNOSIS — H40033 Anatomical narrow angle, bilateral: Secondary | ICD-10-CM | POA: Diagnosis not present

## 2016-09-30 DIAGNOSIS — H04123 Dry eye syndrome of bilateral lacrimal glands: Secondary | ICD-10-CM | POA: Diagnosis not present

## 2016-10-05 ENCOUNTER — Ambulatory Visit (INDEPENDENT_AMBULATORY_CARE_PROVIDER_SITE_OTHER): Payer: Medicare Other | Admitting: Family Medicine

## 2016-10-05 VITALS — BP 128/70 | HR 60 | Temp 97.9°F | Ht 73.0 in | Wt 208.2 lb

## 2016-10-05 DIAGNOSIS — I447 Left bundle-branch block, unspecified: Secondary | ICD-10-CM | POA: Diagnosis not present

## 2016-10-05 DIAGNOSIS — Z01818 Encounter for other preprocedural examination: Secondary | ICD-10-CM | POA: Diagnosis not present

## 2016-10-05 NOTE — Progress Notes (Signed)
   HPI  Patient presents today here for surgical clearance.  Patient has been seen by orthopedics and would like to pursue a lumbar decompression. He states that he can easily walk to the mailbox or up a flight of stairs without shortness of breath chest pain, except for his obvious pain associated with his back.  Patient states that he does have a history of left bundle branch block and has been evaluated by cardiology years ago for this.  [place] chest pain or shortness of breath today.  PMH: Smoking status noted ROS: Per HPI  Objective: BP 128/70   Pulse 60   Temp 97.9 F (36.6 C) (Oral)   Ht 6\' 1"  (1.854 m)   Wt 208 lb 3.2 oz (94.4 kg)   BMI 27.47 kg/m  Gen: NAD, alert, cooperative with exam HEENT: NCAT CV: RRR, good S1/S2, no murmur Resp: CTABL, no wheezes, non-labored Ext: No edema, warm Neuro: Alert and oriented, No gross deficits  EKG shows left bundle branch block  Assessment and plan:  # Preoperative clearance Recommended cardiology evaluation prior to his surgery given his left bundle branch block, this is a bit more exaggerated than his previous EKG  The patient was quite upset with my decision, however I would like to go ahead and refer him to see if he will follow through with evaluation as recommended. Paperwork filled out and faxed back  # Left bundle branch block Stable per patient's history, no chest pain today, no signs of ACS. Recommended cardiology evaluation   Laroy Apple, MD [facility] 10/06/2016, 11:46 AM

## 2016-10-06 ENCOUNTER — Encounter: Payer: Self-pay | Admitting: Family Medicine

## 2016-10-10 ENCOUNTER — Encounter: Payer: Self-pay | Admitting: Cardiology

## 2016-10-10 ENCOUNTER — Ambulatory Visit (INDEPENDENT_AMBULATORY_CARE_PROVIDER_SITE_OTHER): Payer: Medicare Other | Admitting: Cardiology

## 2016-10-10 VITALS — BP 146/80 | HR 62 | Ht 72.0 in | Wt 208.0 lb

## 2016-10-10 DIAGNOSIS — Z01810 Encounter for preprocedural cardiovascular examination: Secondary | ICD-10-CM | POA: Insufficient documentation

## 2016-10-10 DIAGNOSIS — I447 Left bundle-branch block, unspecified: Secondary | ICD-10-CM

## 2016-10-10 DIAGNOSIS — G20 Parkinson's disease: Secondary | ICD-10-CM | POA: Diagnosis not present

## 2016-10-10 NOTE — Progress Notes (Signed)
Cardiology Office Note:    Date:  10/10/2016   ID:  John Norton, DOB 09-11-1955, MRN [id number]  PCP:  Claretta Fraise, MD  Cardiologist:  Jenean Lindau, MD   Referring MD: Timmothy Euler, MD    ASSESSMENT:    1. Parkinson's disease ([place])   2. LBBB (left bundle branch block)   3. Pre-operative cardiovascular examination    PLAN:    In order of problems listed above:  1. I discussed my findings with the patient and his wife at extensive length. Patient leads a sedentary lifestyle. He has some risk factors for coronary artery disease. He's had a smoking history in the past. More importantly the cause of his sedentary lifestyle it's hard to gauge his cardiac condition and he understands this. In view of this he will undergo Lexiscan sestamibi. Echocardiogram will be done to assess murmur heard on auscultation. If this is unremarkable then he is not at high risk for coronary events during the aforementioned surgery. Medical is hemodynamic monitoring will further reduce the risk of coronary events. He will be seen in follow-up appointment on a when necessary basis only. 2. Patient and wife had multiple questions which were answered to their satisfaction.   Medication Adjustments/Labs and Tests Ordered: Current medicines are reviewed at length with the patient today.  Concerns regarding medicines are outlined above.  No orders of the defined types were placed in this encounter.  No orders of the defined types were placed in this encounter.    History of Present Illness:    John Norton is a 61 y.o. male who is being seen today for the evaluation of Preop cardiovascular risk stratification at the request of Timmothy Euler, MD. Patient is a pleasant 61 year old male. He has past medical history of Parkinson's disease. He is planning to undergo orthopedic surgery. He is sent here for preoperative risk stratification. He denies any chest pain orthopnea or PND. At the time of  my evaluation he is alert awake oriented and in no distress. His wife accompanies him. His effort tolerance cannot be gauged because of his Parkinson's and he leads a fairly sedentary lifestyle. No syncopal episodes or any such problems he denies any history of hypertension, diabetes mellitus, dyslipidemia. He has smoked in the past but quit many years ago.  Past Medical History:  Diagnosis Date  . ED (erectile dysfunction)   . Kidney stone   . LBBB (left bundle branch block)   . Neuropathy    trigeminal pain  . Parkinson's disease   . Plantar fasciitis 2017  . Sleep apnea    does not use cpap    Past Surgical History:  Procedure Laterality Date  . "spot on lung"    . CHEST X-RAY  08/21/10   No cardiopulmonary process noted.  . CHOLECYSTECTOMY N/A 04/20/2012   Procedure: LAPAROSCOPIC CHOLECYSTECTOMY;  Surgeon: Jamesetta So, MD;  Location: [facility];  Service: General;  Laterality: N/A;  . [place]  . LEXISCAN MYOVIEW  08/22/10    There is no stress-induced ischemia,  but a fixed deficit in the lateral apex and inferior wall.  Ejection fraction 51%  . THYROID LOBECTOMY     RIGHT THYROID    Current Medications: Current Meds  Medication Sig  . Amantadine HCl 100 MG tablet Take 1 tablet by mouth 3 (three) times daily.  Marland Kitchen aspirin 81 MG EC tablet Take 81 mg by mouth daily.    [employer] (  VITAMIN C) CHEW Chew 1 tablet by mouth every morning.  . carbidopa-levodopa (SINEMET) 25-100 MG per tablet Take 0.5 tablets by mouth 4 (four) times daily. Takes three to four times daily  . cholecalciferol (VITAMIN D) 1000 UNITS tablet Take 1,000 Units by mouth every morning.  . Multiple Vitamin (MULTIVITAMIN WITH MINERALS) TABS Take 1 tablet by mouth every morning.  . Omega-3 Fatty Acids (FISH OIL) 1200 MG CAPS Take 1 capsule by mouth every morning.  . pregabalin (LYRICA) 200 MG capsule Take 1 capsule (200 mg total) by mouth 3 (three) times daily.  . propranolol  (INDERAL) 60 MG tablet Take 60 mg by mouth daily.  . selegiline (ELDEPRYL) 5 MG capsule Take 1 capsule by mouth 2 (two) times daily.  . sildenafil (VIAGRA) 100 MG tablet Take 100 mg by mouth daily as needed for erectile dysfunction.      Allergies:   Codeine   Social History   Social History  . Marital status: Married    Spouse name: N/A  . Number of children: 1  . Years of education: 58   Occupational History  . disability    Social History Main Topics  . Smoking status: Former Smoker    Packs/day: 0.25    Years: 40.00    Types: Cigarettes    Quit date: 07/30/2010  . Smokeless tobacco: Current User    Types: Chew  . Alcohol use No  . Drug use: No  . Sexual activity: Not Asked   Other Topics Concern  . None   Social History Narrative   Lives with wife in a one story home.  Has one daughter.  On disability.  Education: 12th     Family History: The patient's family history includes Colon polyps in his brother. There is no history of Colon cancer, Rectal cancer, Stomach cancer, or Esophageal cancer.  ROS:   Please see the history of present illness.    All other systems reviewed and are negative.  EKGs/Labs/Other Studies Reviewed:    The following studies were reviewed today: I reviewed records from primary care doctor's office including EKG reveals sinus rhythm with left bundle branch block. Lipids were also reviewed.   Recent Labs: 12/29/2015: ALT 9; BUN 21; Creatinine, Ser 1.07; Potassium 4.1; Sodium 147  Recent Lipid Panel    Component Value Date/Time   CHOL 164 12/29/2015 1720   TRIG 226 (H) 12/29/2015 1720   HDL 27 (L) 12/29/2015 1720   CHOLHDL 6.1 (H) 12/29/2015 1720   CHOLHDL 6.7 08/22/2010 0650   VLDL 49 (H) 08/22/2010 0650   LDLCALC 92 12/29/2015 1720    Physical Exam:    VS:  There were no vitals taken for this visit.    Wt Readings from Last 3 Encounters:  10/06/16 208 lb 3.2 oz (94.4 kg)  06/25/16 201 lb 6 oz (91.3 kg)  06/03/16 209 lb  (94.8 kg)     GEN: Patient is in no acute distress HEENT: Normal NECK: No JVD; No carotid bruits LYMPHATICS: No lymphadenopathy CARDIAC: S1 S2 regular, 2/6 systolic murmur at the apex. RESPIRATORY:  Clear to auscultation without rales, wheezing or rhonchi  ABDOMEN: Soft, non-tender, non-distended MUSCULOSKELETAL:  No edema; No deformity  SKIN: Warm and dry NEUROLOGIC:  Alert and oriented x 3 PSYCHIATRIC:  Normal affect    Signed, Jenean Lindau, MD  10/10/2016 8:16 AM    [facility]

## 2016-10-10 NOTE — Addendum Note (Signed)
Addended by: Kathyrn Sheriff on: 10/10/2016 08:56 AM   Modules accepted: Orders

## 2016-10-10 NOTE — Patient Instructions (Signed)
Medication Instructions:  Your physician recommends that you continue on your current medications as directed. Please refer to the Current Medication list given to you today.  Labwork: None Testing/Procedures: Your physician has requested that you have an echocardiogram. Echocardiography is a painless test that uses sound waves to create images of your heart. It provides your doctor with information about the size and shape of your heart and how well your heart's chambers and valves are working. This procedure takes approximately one hour. There are no restrictions for this procedure.  Your physician has requested that you have a lexiscan myoview. For further information please visit [email or web address]. Please follow instruction sheet, as given.   Follow-Up: Your physician recommends that you schedule a follow-up appointment in: As Needed  Any Other Special Instructions Will Be Listed Below (If Applicable).  Please note that any paperwork needing to be filled out by the provider will need to be addressed at the front desk prior to seeing the provider. Please note that any paperwork FMLA, Disability or other documents regarding health condition is subject to a $25.00 charge that must be received prior to completion of paperwork.    If you need a refill on your cardiac medications before your next appointment, please call your pharmacy.

## 2016-10-11 NOTE — Addendum Note (Signed)
Addended by: Kathyrn Sheriff on: 10/11/2016 03:36 PM   Modules accepted: Orders

## 2016-10-13 ENCOUNTER — Encounter (HOSPITAL_COMMUNITY): Payer: Medicare Other

## 2016-10-13 ENCOUNTER — Telehealth (HOSPITAL_COMMUNITY): Payer: Self-pay | Admitting: *Deleted

## 2016-10-13 NOTE — Telephone Encounter (Signed)
Patient given detailed instructions per Myocardial Perfusion Study Information Sheet for the test on 10/17/16 Patient notified to arrive 15 minutes early and that it is imperative to arrive on time for appointment to keep from having the test rescheduled.  If you need to cancel or reschedule your appointment, please call the office within 24 hours of your appointment. . Patient verbalized understanding. Kirstie Peri

## 2016-10-17 ENCOUNTER — Ambulatory Visit (HOSPITAL_BASED_OUTPATIENT_CLINIC_OR_DEPARTMENT_OTHER): Payer: Medicare Other

## 2016-10-17 ENCOUNTER — Encounter (HOSPITAL_COMMUNITY): Payer: Medicare Other

## 2016-10-17 ENCOUNTER — Other Ambulatory Visit: Payer: Self-pay | Admitting: *Deleted

## 2016-10-17 ENCOUNTER — Other Ambulatory Visit: Payer: Medicare Other | Admitting: *Deleted

## 2016-10-17 ENCOUNTER — Ambulatory Visit (HOSPITAL_COMMUNITY): Payer: Medicare Other | Attending: Cardiovascular Disease

## 2016-10-17 ENCOUNTER — Other Ambulatory Visit: Payer: Self-pay

## 2016-10-17 DIAGNOSIS — I4891 Unspecified atrial fibrillation: Secondary | ICD-10-CM | POA: Diagnosis not present

## 2016-10-17 DIAGNOSIS — Z01810 Encounter for preprocedural cardiovascular examination: Secondary | ICD-10-CM

## 2016-10-17 DIAGNOSIS — G20 Parkinson's disease: Secondary | ICD-10-CM | POA: Insufficient documentation

## 2016-10-17 DIAGNOSIS — R9439 Abnormal result of other cardiovascular function study: Secondary | ICD-10-CM | POA: Diagnosis not present

## 2016-10-17 DIAGNOSIS — I059 Rheumatic mitral valve disease, unspecified: Secondary | ICD-10-CM | POA: Insufficient documentation

## 2016-10-17 DIAGNOSIS — I447 Left bundle-branch block, unspecified: Secondary | ICD-10-CM | POA: Insufficient documentation

## 2016-10-17 DIAGNOSIS — Z87891 Personal history of nicotine dependence: Secondary | ICD-10-CM | POA: Insufficient documentation

## 2016-10-17 LAB — MYOCARDIAL PERFUSION IMAGING
CHL CUP NUCLEAR SDS: 3
Peak HR: 75 {beats}/min
RATE: 0.27
Rest HR: 57 {beats}/min
SRS: 3
SSS: 6
TID: 0.78

## 2016-10-17 MED ORDER — TECHNETIUM TC 99M TETROFOSMIN IV KIT
32.7000 | PACK | Freq: Once | INTRAVENOUS | Status: AC | PRN
  Administered 2016-10-17: 32.7 via INTRAVENOUS
  Filled 2016-10-17: qty 33

## 2016-10-17 MED ORDER — TECHNETIUM TC 99M TETROFOSMIN IV KIT
10.9000 | PACK | Freq: Once | INTRAVENOUS | Status: AC | PRN
  Administered 2016-10-17: 10.9 via INTRAVENOUS
  Filled 2016-10-17: qty 11

## 2016-10-17 MED ORDER — PERFLUTREN LIPID MICROSPHERE
1.0000 mL | INTRAVENOUS | Status: AC | PRN
  Administered 2016-10-17: 2 mL via INTRAVENOUS

## 2016-10-17 MED ORDER — REGADENOSON 0.4 MG/5ML IV SOLN
0.4000 mg | Freq: Once | INTRAVENOUS | Status: AC
  Administered 2016-10-17: 0.4 mg via INTRAVENOUS

## 2016-10-18 LAB — BASIC METABOLIC PANEL
BUN / CREAT RATIO: 15 (ref 10–24)
BUN: 16 mg/dL (ref 8–27)
CHLORIDE: 104 mmol/L (ref 96–106)
CO2: 27 mmol/L (ref 20–29)
Calcium: 9.7 mg/dL (ref 8.6–10.2)
Creatinine, Ser: 1.1 mg/dL (ref 0.76–1.27)
GFR calc non Af Amer: 73 mL/min/{1.73_m2} (ref >59)
GFR, EST AFRICAN AMERICAN: 84 mL/min/{1.73_m2} (ref >59)
Glucose: 144 mg/dL — ABNORMAL HIGH (ref 65–99)
POTASSIUM: 4.2 mmol/L (ref 3.5–5.2)
Sodium: 146 mmol/L — ABNORMAL HIGH (ref 134–144)

## 2016-10-18 LAB — CBC
Hematocrit: 46.7 % (ref 37.5–51.0)
Hemoglobin: 15.7 g/dL (ref 13.0–17.7)
MCH: 31.5 pg (ref 26.6–33.0)
MCHC: 33.6 g/dL (ref 31.5–35.7)
MCV: 94 fL (ref 79–97)
PLATELETS: 232 10*3/uL (ref 150–379)
RBC: 4.98 x10E6/uL (ref 4.14–5.80)
RDW: 13.9 % (ref 12.3–15.4)
WBC: 9.4 10*3/uL (ref 3.4–10.8)

## 2016-10-18 LAB — TSH: TSH: 1.45 u[IU]/mL (ref 0.450–4.500)

## 2016-10-20 ENCOUNTER — Ambulatory Visit (INDEPENDENT_AMBULATORY_CARE_PROVIDER_SITE_OTHER): Payer: Medicare Other | Admitting: Cardiology

## 2016-10-20 ENCOUNTER — Encounter: Payer: Self-pay | Admitting: Cardiology

## 2016-10-20 VITALS — BP 134/80 | HR 64 | Ht 72.0 in | Wt 212.1 lb

## 2016-10-20 DIAGNOSIS — Z01810 Encounter for preprocedural cardiovascular examination: Secondary | ICD-10-CM

## 2016-10-20 DIAGNOSIS — I480 Paroxysmal atrial fibrillation: Secondary | ICD-10-CM | POA: Diagnosis not present

## 2016-10-20 DIAGNOSIS — I447 Left bundle-branch block, unspecified: Secondary | ICD-10-CM | POA: Diagnosis not present

## 2016-10-20 HISTORY — DX: Paroxysmal atrial fibrillation: I48.0

## 2016-10-20 NOTE — Progress Notes (Signed)
Cardiology Office Note:    Date:  10/20/2016   ID:  John Norton, DOB 1956-01-19, MRN [id number]  PCP:  Claretta Fraise, MD  Cardiologist:  Jenean Lindau, MD   Referring MD: Claretta Fraise, MD    ASSESSMENT:    1. LBBB (left bundle branch block)   2. Pre-operative cardiovascular examination   3. Paroxysmal atrial fibrillation (HCC)    PLAN:    In order of problems listed above:   1. I discussed my findings with the patient at extensive length. His stress test and echocardiogram revealed a preserved left ventricular systolic function. There was no evidence of ischemia on the stress test. In view of this is not at high risk for coronary events during the aforementioned surgery. Meticulous hemodynamic monitoring and continue perioperative uninterrupted beta blockade will further reduce the risk of coronary events. Patient will need to be initiated on 325 mg a coated aspirin when his surgeon feels that he is okay to receive this medicine. I'm not going to start this now because the patient is planning to undergo surgery in the next few days. His chads score is 0 and his gait is completely stable. Atrial fibrillation issues were discussed with him at length. He will be seen in follow-up appointment in 6-8 weeks or earlier if he has any concerns. 2. A copy of these records were faxed to his surgeon.   Medication Adjustments/Labs and Tests Ordered: Current medicines are reviewed at length with the patient today.  Concerns regarding medicines are outlined above.  No orders of the defined types were placed in this encounter.  No orders of the defined types were placed in this encounter.    Chief Complaint  Patient presents with  . Follow-up    Doing well      History of Present Illness:    John Norton is a 61 y.o. male he was evaluated by me for preoperative risk stratification. He has risk factors for coronary artery disease but were more importantly because of Parkinson's  he leads a sedentary lifestyle. He underwent stress testing and echocardiogram testing and this did not reveal any evidence of ischemia. During that evaluation with a stress test he had paroxysmal atrial fibrillation. Subsequently stent finding. No chest pain orthopnea PND or any palpitations. At the time of my evaluation he is alert awake oriented and in no distress.    Past Medical History:  Diagnosis Date  . ED (erectile dysfunction)   . Kidney stone   . LBBB (left bundle branch block)   . Neuropathy    trigeminal pain  . Parkinson's disease   . Plantar fasciitis 2017  . Sleep apnea    does not use cpap    Past Surgical History:  Procedure Laterality Date  . "spot on lung"    . CHEST X-RAY  08/21/10   No cardiopulmonary process noted.  . CHOLECYSTECTOMY N/A 04/20/2012   Procedure: LAPAROSCOPIC CHOLECYSTECTOMY;  Surgeon: Jamesetta So, MD;  Location: [facility];  Service: General;  Laterality: N/A;  . [place]  . LEXISCAN MYOVIEW  08/22/10    There is no stress-induced ischemia,  but a fixed deficit in the lateral apex and inferior wall.  Ejection fraction 51%  . THYROID LOBECTOMY     RIGHT THYROID    Current Medications: Current Meds  Medication Sig  . Amantadine HCl 100 MG tablet Take 1 tablet by mouth 3 (three) times daily.  Marland Kitchen aspirin 81 MG EC tablet  Take 81 mg by mouth daily.    Marland Kitchen Bioflavonoid Products (VITAMIN C) CHEW Chew 1 tablet by mouth every morning.  . carbidopa-levodopa (SINEMET) 25-100 MG per tablet Take 0.5 tablets by mouth 4 (four) times daily. Takes three to four times daily  . cholecalciferol (VITAMIN D) 1000 UNITS tablet Take 1,000 Units by mouth every morning.  . Multiple Vitamin (MULTIVITAMIN WITH MINERALS) TABS Take 1 tablet by mouth every morning.  . Omega-3 Fatty Acids (FISH OIL) 1200 MG CAPS Take 1 capsule by mouth every morning.  . pregabalin (LYRICA) 200 MG capsule Take 1 capsule (200 mg total) by mouth 3 (three) times daily.  .  propranolol (INDERAL) 60 MG tablet Take 60 mg by mouth daily.  . selegiline (ELDEPRYL) 5 MG capsule Take 1 capsule by mouth 2 (two) times daily.  . sildenafil (VIAGRA) 100 MG tablet Take 100 mg by mouth daily as needed for erectile dysfunction.      Allergies:   Codeine   Social History   Social History  . Marital status: Married    Spouse name: N/A  . Number of children: 1  . Years of education: 60   Occupational History  . disability    Social History Main Topics  . Smoking status: Former Smoker    Packs/day: 0.25    Years: 40.00    Types: Cigarettes    Quit date: 07/30/2010  . Smokeless tobacco: Current User    Types: Chew  . Alcohol use No  . Drug use: No  . Sexual activity: Not Asked   Other Topics Concern  . None   Social History Narrative   Lives with wife in a one story home.  Has one daughter.  On disability.  Education: 12th     Family History: The patient's family history includes Colon polyps in his brother. There is no history of Colon cancer, Rectal cancer, Stomach cancer, or Esophageal cancer.  ROS:   Please see the history of present illness.    All other systems reviewed and are negative.  EKGs/Labs/Other Studies Reviewed:    The following studies were reviewed today: I discussed findings with stress test and the echocardiogram with him at extensive length I reviewed the stress test report and the EKG was sent to me which revealed of paroxysmal atrial fibrillation.   Recent Labs: 12/29/2015: ALT 9 10/17/2016: BUN 16; Creatinine, Ser 1.10; Hemoglobin 15.7; Platelets 232; Potassium 4.2; Sodium 146; TSH 1.450  Recent Lipid Panel    Component Value Date/Time   CHOL 164 12/29/2015 1720   TRIG 226 (H) 12/29/2015 1720   HDL 27 (L) 12/29/2015 1720   CHOLHDL 6.1 (H) 12/29/2015 1720   CHOLHDL 6.7 08/22/2010 0650   VLDL 49 (H) 08/22/2010 0650   LDLCALC 92 12/29/2015 1720    Physical Exam:    VS:  BP 134/80   Pulse 64   Ht 6' (1.829 m)   Wt 212  lb 1.9 oz (96.2 kg)   SpO2 97%   BMI 28.77 kg/m     Wt Readings from Last 3 Encounters:  10/20/16 212 lb 1.9 oz (96.2 kg)  10/17/16 208 lb (94.3 kg)  10/10/16 208 lb 0.6 oz (94.4 kg)     GEN: Patient is in no acute distress HEENT: Normal NECK: No JVD; No carotid bruits LYMPHATICS: No lymphadenopathy CARDIAC: Hear sounds regular, 2/6 systolic murmur at the apex. RESPIRATORY:  Clear to auscultation without rales, wheezing or rhonchi  ABDOMEN: Soft, non-tender, non-distended MUSCULOSKELETAL:  No edema;  No deformity  SKIN: Warm and dry NEUROLOGIC:  Alert and oriented x 3 PSYCHIATRIC:  Normal affect   Signed, Jenean Lindau, MD  10/20/2016 9:38 AM     [facility]

## 2016-10-25 ENCOUNTER — Ambulatory Visit: Payer: Self-pay | Admitting: Orthopedic Surgery

## 2016-10-25 NOTE — H&P (Signed)
John Norton is an 61 y.o. male.   Chief Complaint: back and leg pain HPI: The patient is a 61 year old male. The patient is here today in referral from Dr. Nelva Bush. The patient reports low back symptoms including pain and right leg pain which began year(s) ago without any known injury. and Symptoms include decreased range of motion. The pain radiates to the right buttock, right posterior thigh and right lower leg. The patient describes the pain as sharp, dull and aching. The symptom onset was gradual. The patient describes the severity of their symptoms as 2 / 10 (states it is better since taking the Prednisone). The patient feels as if the symptoms are worsening ("some days my pain will be a 9 or a 10, and that usually happens if I have to walk a lot"). Symptoms are exacerbated by standing and sitting. Symptoms are relieved by rest and recumbency. Current treatment includes nonsteroidal anti-inflammatory drugs. Prior to being seen today the patient was previously evaluated in this clinic. Symptoms present at the patient's previous evaluation included back pain. Past evaluation has included x-ray of the lumbar spine. Past treatment has included corticosteroids and Right L5 SNRB on 08/24/16 and R L5-S1 ESI on 07/13/16 (Patient states that neither one of the shots really helped him that much.).  John Norton is here as kind surgical referral from Dr. Nelva Bush. He has had a long history of leg pain, buttock and leg. Minimal back pain. It is right greater than left, years of duration, worse as recently he has noticed weakness on both sides. He does also have a history of Parkinson's as well.  He feels this is secondary to this. He was also diagnosed with idiopathic peripheral neuropathy.  He has reported no fevers or chills associated with this. No rashes associated with this.  It is worse when he stands and he attempts to walk, i.e. at [employer] he is unable to. He sits and he has immediate relief of  symptoms.  Dr. Nelva Bush has done injections right L5 selective nerve root block with only temporary relief. He has tried Lyrica without any help. He has been in pain management chronically.  He had an MRI, which indicated stenosis at L4-5.  Again reporting buttock and leg pain is significantly worse and only minor back pain.  He is continued with significant symptomatology.  Past Medical History:  Diagnosis Date  . ED (erectile dysfunction)   . Kidney stone   . LBBB (left bundle branch block)   . Neuropathy    trigeminal pain  . Parkinson's disease   . Plantar fasciitis 2017  . Sleep apnea    does not use cpap    Past Surgical History:  Procedure Laterality Date  . "spot on lung"    . CHEST X-RAY  08/21/10   No cardiopulmonary process noted.  . CHOLECYSTECTOMY N/A 04/20/2012   Procedure: LAPAROSCOPIC CHOLECYSTECTOMY;  Surgeon: Jamesetta So, MD;  Location: [facility];  Service: General;  Laterality: N/A;  . [place]  . LEXISCAN MYOVIEW  08/22/10    There is no stress-induced ischemia,  but a fixed deficit in the lateral apex and inferior wall.  Ejection fraction 51%  . THYROID LOBECTOMY     RIGHT THYROID    Family History  Problem Relation Age of Onset  . Colon polyps Brother   . Colon cancer Neg Hx   . Rectal cancer Neg Hx   . Stomach cancer Neg Hx   .  Esophageal cancer Neg Hx    Social History:  reports that he quit smoking about 6 years ago. His smoking use included Cigarettes. He has a 10.00 pack-year smoking history. His smokeless tobacco use includes Chew. He reports that he does not drink alcohol or use drugs.  Allergies:  Allergies  Allergen Reactions  . Codeine Nausea Only     (Not in a hospital admission)  No results found for this or any previous visit (from the past 48 hour(s)). No results found.  Review of Systems  Constitutional: Negative.   HENT: Negative.   Eyes: Negative.   Respiratory: Negative.   Cardiovascular: Negative.    Gastrointestinal: Negative.   Genitourinary: Negative.   Musculoskeletal: Positive for back pain.  Skin: Negative.   Neurological: Positive for sensory change and focal weakness.  Psychiatric/Behavioral: Negative.     There were no vitals taken for this visit. Physical Exam  Constitutional: He is oriented to person, place, and time. He appears well-developed and well-nourished.  HENT:  Head: Normocephalic.  Eyes: Pupils are equal, round, and reactive to light.  Neck: Normal range of motion.  Cardiovascular: Normal rate.   Respiratory: Effort normal.  GI: Soft.  Musculoskeletal:  He is a healthy male. He stands and has pain in the right lower extremity, also into the left. Straight leg raise with buttock, thigh, and calf pain on the right, negative on the left. He has EHL 4+/5 bilaterally. His pain with extension into the legs is relieved actually with sitting and with forward flexion. He does walk with a slight parkinsonian walk, but has more of a tremor type parkinsonian.  Inspection of the cervical spine reveals a normal lordosis without evidence of paraspinous spasms or soft tissue swelling. Nontender to palpation. Full flexion, full extension, full left and right lateral rotation. Extension combined with lateral flexion does not reproduce pain. Negative impingement sign, negative secondary impingement sign of the shoulders. Negative Tinel's median and ulnar nerves at the elbow. Negative carpal compression test at the wrist. Motor of the upper extremities is 5/5 including biceps, triceps, brachioradialis, wrist flexion, wrist extension, finger flexion, finger extension. Reflexes are normoreflexic. Sensory exam is intact to light touch. There is no Hoffmann sign. Nontender over the thoracic spine. He has a slight intention tremor.  Neurological: He is alert and oriented to person, place, and time.  Skin: Skin is warm and dry.    Three-view radiographs, [facility], lateral, flexion and extension  of the lumbar spine demonstrate a scoliosis noted at L1 to L5 of 13-1/2 degrees. Dextrorotary. Hips are unremarkable. He has disc degeneration near end stage at L4-5 and at L5-S1. Calcification of the aorta is noted. No instability in flexion or extension.  MRI was reviewed from 09/07/2016. It demonstrates multifactorial stenosis at L4-5 severe with facet hypertrophy with predominantly ligamentum flavum hypertrophy, although some facet hypertrophy on the left. He has neural foraminal stenosis, left actually greater than right.  At L5-S1, he has lateral recess stenosis on the right with neural foraminal narrowing.  Assessment/Plan 1. 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 2. Peripheral neuropathy. 3. Parkinson's disease. 4. 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 5. Peripheral vascular disease.  I had extensive discussion with Mr. Strollo concerning his current pathology, relevant anatomy, and treatment options. It is a [employer] duration. He has predominantly a radicular pain that is worse with standing in L5 nerve root distribution. They indicate that MRI will underestimate the  degree of stenosis given that it is in the supine position and unloaded. It is apparent given his current symptomatology that it is immediately relieved when he sits. Typically, idiopathic neuropathy symptoms are typically persistent and not possibly dependent. He has had symptoms relieved by a steroid. Again, he has an end-stage disc degeneration at L4-5 and at L5-S1. Minimal back pain. We discussed decompression at L4-5 of the lateral recess as well as at L5-S1 on the right and foraminotomy of L5 as a micro lumbar decompression. Given his disc degeneration, he has a convexity to the right, which should open the neural foramen and preclude the need for a surgical fusion. However, I did indicate he may require a multilevel instrumented fusion  with distraction of the disc space, etc. There should be initial conservative approach given that he does have complete relief when he sits and he has minimal back pain. The foramen appeared to be not statically compressing the nerve root on the right. He does have facet arthropathy on the right as well at L5-S1.  I had an extensive discussion of the risks and benefits of the lumbar decompression with the patient including bleeding, infection, damage to neurovascular structures, epidural fibrosis, CSF leak requiring repair. We also discussed increase in pain, adjacent segment disease, recurrent disc herniation, need for future surgery including repeat decompression and/or fusion. We also discussed risks of postoperative hematoma, paralysis, anesthetic complications including DVT, PE, death, cardiopulmonary dysfunction. In addition, the perioperative and postoperative courses were discussed in detail including the rehabilitative time and return to functional activity and work. I provided the patient with an illustrated handout and utilized the appropriate surgical models.  We discussed perioperative management of Parkinson's avoiding anticholinergics as well as antispasmodics. Certainly, we discussed preoperative clearance overnight in the hospital, six weeks for surgical healing and then an additional six weeks for reconditioning. Given his Parkinson's and the EHL weakness, this I feel is aggravating would be compounding his fall risk if it progresses into a full footdrop. Again, we will have him undergo preoperative clearance by his neurologist in [place]. I appreciate the kind referral.  Plan microlumbar decompression L4-5, L5-S1  Arias Weinert, Conley Rolls., PA-C for Dr. Tonita Cong 10/25/2016, 9:27 AM

## 2016-10-27 ENCOUNTER — Other Ambulatory Visit (HOSPITAL_COMMUNITY): Payer: Self-pay | Admitting: *Deleted

## 2016-10-27 NOTE — Patient Instructions (Addendum)
John Norton  10/27/2016   Your procedure is scheduled on: Thursday 11-03-16  Report to [facility] Main  Entrance Take [place]  elevators to 3rd floor to  [place] at 530  AM.  Call this number if you have problems the morning of surgery [phone]   Remember: ONLY 1 PERSON MAY GO WITH YOU TO SHORT STAY TO GET  READY MORNING OF [place].  Do not eat food or drink liquids :After Midnight.     Take these medicines the morning of surgery with A SIP OF WATER: AMANTADINE, CARBIDOPA-LEVIDOPA (SINIMET), PROPRANOLOL, SEREGELINE                               You may not have any metal on your body including hair pins and              piercings  Do not wear jewelry, make-up, lotions, powders or perfumes, deodorant             Do not wear nail polish.  Do not shave  48 hours prior to surgery.              Men may shave face and neck.   Do not bring valuables to the hospital. [place].  Contacts, dentures or bridgework may not be worn into surgery.  Leave suitcase in the car. After surgery it may be brought to your room.                   Please read over the following fact sheets you were given: _____________________________________________________________________             [facility] - Preparing for Surgery Before surgery, you can play an important role.  Because skin is not sterile, your skin needs to be as free of germs as possible.  You can reduce the number of germs on your skin by washing with CHG (chlorahexidine gluconate) soap before surgery.  CHG is an antiseptic cleaner which kills germs and bonds with the skin to continue killing germs even after washing. Please DO NOT use if you have an allergy to CHG or antibacterial soaps.  If your skin becomes reddened/irritated stop using the CHG and inform your nurse when you arrive at Short Stay. Do not shave (including legs and underarms) for at  least 48 hours prior to the first CHG shower.  You may shave your face/neck. Please follow these instructions carefully:  1.  Shower with CHG Soap the night before surgery and the  morning of Surgery.  2.  If you choose to wash your hair, wash your hair first as usual with your  normal  shampoo.  3.  After you shampoo, rinse your hair and body thoroughly to remove the  shampoo.                           4.  Use CHG as you would any other liquid soap.  You can apply chg directly  to the skin and wash                       Gently with a scrungie or clean washcloth.  5.  Apply the CHG Soap to your body ONLY FROM THE NECK DOWN.   Do not use on face/ open                           Wound or open sores. Avoid contact with eyes, ears mouth and genitals (private parts).                       Wash face,  Genitals (private parts) with your normal soap.             6.  Wash thoroughly, paying special attention to the area where your surgery  will be performed.  7.  Thoroughly rinse your body with warm water from the neck down.  8.  DO NOT shower/wash with your normal soap after using and rinsing off  the CHG Soap.                9.  Pat yourself dry with a clean towel.            10.  Wear clean pajamas.            11.  Place clean sheets on your bed the night of your first shower and do not  sleep with pets. Day of Surgery : Do not apply any lotions/deodorants the morning of surgery.  Please wear clean clothes to the hospital/surgery center.  FAILURE TO FOLLOW THESE INSTRUCTIONS MAY RESULT IN THE CANCELLATION OF YOUR SURGERY PATIENT SIGNATURE_________________________________  NURSE SIGNATURE__________________________________  ________________________________________________________________________   Adam Phenix  An incentive spirometer is a tool that can help keep your lungs clear and active. This tool measures how well you are filling your lungs with each breath. Taking long deep breaths  may help reverse or decrease the chance of developing breathing (pulmonary) problems (especially infection) following:  A long period of time when you are unable to move or be active. BEFORE THE PROCEDURE   If the spirometer includes an indicator to show your best effort, your nurse or respiratory therapist will set it to a desired goal.  If possible, sit up straight or lean slightly forward. Try not to slouch.  Hold the incentive spirometer in an upright position. INSTRUCTIONS FOR USE  1. Sit on the edge of your bed if possible, or sit up as far as you can in bed or on a chair. 2. Hold the incentive spirometer in an upright position. 3. Breathe out normally. 4. Place the mouthpiece in your mouth and seal your lips tightly around it. 5. Breathe in slowly and as deeply as possible, raising the piston or the ball toward the top of the column. 6. Hold your breath for 3-5 seconds or for as long as possible. Allow the piston or ball to fall to the bottom of the column. 7. Remove the mouthpiece from your mouth and breathe out normally. 8. Rest for a few seconds and repeat Steps 1 through 7 at least 10 times every 1-2 hours when you are awake. Take your time and take a few normal breaths between deep breaths. 9. The spirometer may include an indicator to show your best effort. Use the indicator as a goal to work toward during each repetition. 10. After each set of 10 deep breaths, practice coughing to be sure your lungs are clear. If you have an incision (the cut made at the time of surgery), support your incision when coughing by placing a  pillow or rolled up towels firmly against it. Once you are able to get out of bed, walk around indoors and cough well. You may stop using the incentive spirometer when instructed by your caregiver.  RISKS AND COMPLICATIONS  Take your time so you do not get dizzy or light-headed.  If you are in pain, you may need to take or ask for pain medication before doing  incentive spirometry. It is harder to take a deep breath if you are having pain. AFTER USE  Rest and breathe slowly and easily.  It can be helpful to keep track of a log of your progress. Your caregiver can provide you with a simple table to help with this. If you are using the spirometer at home, follow these instructions: [place] IF:   You are having difficultly using the spirometer.  You have trouble using the spirometer as often as instructed.  Your pain medication is not giving enough relief while using the spirometer.  You develop fever of 100.5 F (38.1 C) or higher. SEEK IMMEDIATE MEDICAL CARE IF:   You cough up bloody sputum that had not been present before.  You develop fever of 102 F (38.9 C) or greater.  You develop worsening pain at or near the incision site. MAKE SURE YOU:   Understand these instructions.  Will watch your condition.  Will get help right away if you are not doing well or get worse. Document Released: 06/27/2006 Document Revised: 05/09/2011 Document Reviewed: 08/28/2006 [facility] Patient Information 2014 [place], [place].   ________________________________________________________________________

## 2016-10-27 NOTE — Progress Notes (Addendum)
CBC, BMET 10-17-16 EPIC MEDICAL [place] NOTE DR Wendi Snipes 10-05-16 ON CHART CARDIAC CLRANACE NOTE DR [facility] 10-20-16 EPIC EKG 10-10-16 EPIC ECHO 10-17-16 EPIC  STRESS TEST 10-17-16

## 2016-10-28 ENCOUNTER — Encounter (HOSPITAL_COMMUNITY): Payer: Self-pay

## 2016-10-28 ENCOUNTER — Encounter (HOSPITAL_COMMUNITY)
Admission: RE | Admit: 2016-10-28 | Discharge: 2016-10-28 | Disposition: A | Discharge status: Home / Self Care | Payer: Medicare Other | Source: Ambulatory Visit | Attending: Specialist | Admitting: Specialist

## 2016-10-28 ENCOUNTER — Ambulatory Visit (HOSPITAL_COMMUNITY)
Admission: RE | Admit: 2016-10-28 | Discharge: 2016-10-28 | Disposition: A | Discharge status: Home / Self Care | Payer: Medicare Other | Source: Ambulatory Visit | Attending: Orthopedic Surgery | Admitting: Orthopedic Surgery

## 2016-10-28 DIAGNOSIS — I700 Atherosclerosis of aorta: Secondary | ICD-10-CM | POA: Insufficient documentation

## 2016-10-28 DIAGNOSIS — M5136 Other intervertebral disc degeneration, lumbar region: Secondary | ICD-10-CM | POA: Insufficient documentation

## 2016-10-28 DIAGNOSIS — M5126 Other intervertebral disc displacement, lumbar region: Secondary | ICD-10-CM

## 2016-10-28 DIAGNOSIS — Z01818 Encounter for other preprocedural examination: Secondary | ICD-10-CM | POA: Diagnosis not present

## 2016-10-28 HISTORY — DX: Tremor, unspecified: R25.1

## 2016-10-28 HISTORY — DX: Personal history of urinary calculi: Z87.442

## 2016-10-28 HISTORY — DX: Spinal stenosis, site unspecified: M48.00

## 2016-10-28 LAB — SURGICAL PCR SCREEN
MRSA, PCR: NEGATIVE
Staphylococcus aureus: POSITIVE — AB

## 2016-11-01 ENCOUNTER — Ambulatory Visit: Payer: Self-pay | Admitting: Orthopedic Surgery

## 2016-11-03 ENCOUNTER — Ambulatory Visit (HOSPITAL_COMMUNITY): Payer: Medicare Other

## 2016-11-03 ENCOUNTER — Ambulatory Visit (HOSPITAL_COMMUNITY): Payer: Medicare Other | Admitting: Anesthesiology

## 2016-11-03 ENCOUNTER — Encounter (HOSPITAL_COMMUNITY)
Admission: RE | Disposition: A | Discharge status: Home / Self Care | Payer: Self-pay | Source: Ambulatory Visit | Attending: Specialist

## 2016-11-03 ENCOUNTER — Encounter (HOSPITAL_COMMUNITY): Payer: Self-pay | Admitting: *Deleted

## 2016-11-03 ENCOUNTER — Observation Stay (HOSPITAL_COMMUNITY)
Admission: RE | Admit: 2016-11-03 | Discharge: 2016-11-04 | Disposition: A | Discharge status: Home / Self Care | Payer: Medicare Other | Source: Ambulatory Visit | Attending: Specialist | Admitting: Specialist

## 2016-11-03 DIAGNOSIS — G20 Parkinson's disease: Secondary | ICD-10-CM | POA: Insufficient documentation

## 2016-11-03 DIAGNOSIS — M545 Low back pain: Secondary | ICD-10-CM | POA: Diagnosis not present

## 2016-11-03 DIAGNOSIS — Z79899 Other long term (current) drug therapy: Secondary | ICD-10-CM | POA: Diagnosis not present

## 2016-11-03 DIAGNOSIS — Z7982 Long term (current) use of aspirin: Secondary | ICD-10-CM | POA: Diagnosis not present

## 2016-11-03 DIAGNOSIS — G4730 Sleep apnea, unspecified: Secondary | ICD-10-CM | POA: Diagnosis not present

## 2016-11-03 DIAGNOSIS — I480 Paroxysmal atrial fibrillation: Secondary | ICD-10-CM | POA: Diagnosis not present

## 2016-11-03 DIAGNOSIS — I700 Atherosclerosis of aorta: Secondary | ICD-10-CM | POA: Insufficient documentation

## 2016-11-03 DIAGNOSIS — Z87891 Personal history of nicotine dependence: Secondary | ICD-10-CM | POA: Insufficient documentation

## 2016-11-03 DIAGNOSIS — M48062 Spinal stenosis, lumbar region with neurogenic claudication: Secondary | ICD-10-CM | POA: Diagnosis not present

## 2016-11-03 DIAGNOSIS — M48061 Spinal stenosis, lumbar region without neurogenic claudication: Secondary | ICD-10-CM

## 2016-11-03 DIAGNOSIS — I447 Left bundle-branch block, unspecified: Secondary | ICD-10-CM | POA: Diagnosis not present

## 2016-11-03 DIAGNOSIS — Z419 Encounter for procedure for purposes other than remedying health state, unspecified: Secondary | ICD-10-CM

## 2016-11-03 DIAGNOSIS — M4807 Spinal stenosis, lumbosacral region: Secondary | ICD-10-CM | POA: Insufficient documentation

## 2016-11-03 HISTORY — PX: LUMBAR LAMINECTOMY/DECOMPRESSION MICRODISCECTOMY: SHX5026

## 2016-11-03 HISTORY — DX: Spinal stenosis, lumbar region without neurogenic claudication: M48.061

## 2016-11-03 SURGERY — LUMBAR LAMINECTOMY/DECOMPRESSION MICRODISCECTOMY 2 LEVELS
Anesthesia: General | Site: Back

## 2016-11-03 MED ORDER — FENTANYL CITRATE (PF) 250 MCG/5ML IJ SOLN
INTRAMUSCULAR | Status: AC
  Filled 2016-11-03: qty 5

## 2016-11-03 MED ORDER — CEFAZOLIN SODIUM-DEXTROSE 2-4 GM/100ML-% IV SOLN
2.0000 g | Freq: Three times a day (TID) | INTRAVENOUS | Status: AC
  Administered 2016-11-03 – 2016-11-04 (×3): 2 g via INTRAVENOUS
  Filled 2016-11-03 (×2): qty 100

## 2016-11-03 MED ORDER — PROPRANOLOL HCL ER 60 MG PO CP24
60.0000 mg | ORAL_CAPSULE | Freq: Every day | ORAL | Status: DC
  Administered 2016-11-04: 09:00:00 60 mg via ORAL
  Filled 2016-11-03: qty 1

## 2016-11-03 MED ORDER — MIDAZOLAM HCL 2 MG/2ML IJ SOLN
INTRAMUSCULAR | Status: AC
  Filled 2016-11-03: qty 2

## 2016-11-03 MED ORDER — LIDOCAINE HCL (CARDIAC) 20 MG/ML IV SOLN
INTRAVENOUS | Status: DC | PRN
  Administered 2016-11-03: 100 mg via INTRAVENOUS

## 2016-11-03 MED ORDER — DEXAMETHASONE SODIUM PHOSPHATE 10 MG/ML IJ SOLN
INTRAMUSCULAR | Status: AC
  Filled 2016-11-03: qty 1

## 2016-11-03 MED ORDER — ACETAMINOPHEN 10 MG/ML IV SOLN
1000.0000 mg | INTRAVENOUS | Status: AC
  Administered 2016-11-03: 1000 mg via INTRAVENOUS

## 2016-11-03 MED ORDER — OXYCODONE-ACETAMINOPHEN 5-325 MG PO TABS: 1.0000 | ORAL_TABLET | ORAL | 0 refills | Status: DC | PRN

## 2016-11-03 MED ORDER — DOCUSATE SODIUM 100 MG PO CAPS
100.0000 mg | ORAL_CAPSULE | Freq: Two times a day (BID) | ORAL | Status: DC
  Administered 2016-11-03 – 2016-11-04 (×2): 100 mg via ORAL
  Filled 2016-11-03 (×2): qty 1

## 2016-11-03 MED ORDER — PREGABALIN 100 MG PO CAPS
200.0000 mg | ORAL_CAPSULE | Freq: Two times a day (BID) | ORAL | Status: DC
  Administered 2016-11-03 – 2016-11-04 (×3): 200 mg via ORAL
  Filled 2016-11-03 (×3): qty 2

## 2016-11-03 MED ORDER — ROCURONIUM BROMIDE 100 MG/10ML IV SOLN
INTRAVENOUS | Status: DC | PRN
  Administered 2016-11-03: 10 mg via INTRAVENOUS
  Administered 2016-11-03: 50 mg via INTRAVENOUS

## 2016-11-03 MED ORDER — ASPIRIN 81 MG PO TBEC: 81.0000 mg | DELAYED_RELEASE_TABLET | Freq: Every day | ORAL | 12 refills | Status: DC

## 2016-11-03 MED ORDER — OXYCODONE HCL 5 MG PO TABS
5.0000 mg | ORAL_TABLET | ORAL | Status: DC | PRN
  Administered 2016-11-03: 17:00:00 5 mg via ORAL
  Administered 2016-11-03 – 2016-11-04 (×2): 10 mg via ORAL
  Filled 2016-11-03 (×2): qty 2
  Filled 2016-11-03: qty 1

## 2016-11-03 MED ORDER — ONDANSETRON HCL 4 MG PO TABS
4.0000 mg | ORAL_TABLET | Freq: Four times a day (QID) | ORAL | Status: DC | PRN
  Filled 2016-11-03: qty 1

## 2016-11-03 MED ORDER — ONDANSETRON HCL 4 MG/2ML IJ SOLN
4.0000 mg | Freq: Four times a day (QID) | INTRAMUSCULAR | Status: DC | PRN
  Administered 2016-11-04: 11:00:00 4 mg via INTRAVENOUS
  Filled 2016-11-03: qty 2

## 2016-11-03 MED ORDER — POLYETHYLENE GLYCOL 3350 17 G PO PACK: 17.0000 g | PACK | Freq: Every day | ORAL | Status: DC | PRN

## 2016-11-03 MED ORDER — FENTANYL CITRATE (PF) 100 MCG/2ML IJ SOLN
INTRAMUSCULAR | Status: DC | PRN
  Administered 2016-11-03: 100 ug via INTRAVENOUS

## 2016-11-03 MED ORDER — AMANTADINE HCL 100 MG PO CAPS
100.0000 mg | ORAL_CAPSULE | Freq: Three times a day (TID) | ORAL | Status: DC
  Administered 2016-11-03 – 2016-11-04 (×3): 100 mg via ORAL
  Filled 2016-11-03 (×5): qty 1

## 2016-11-03 MED ORDER — ROCURONIUM BROMIDE 50 MG/5ML IV SOSY
PREFILLED_SYRINGE | INTRAVENOUS | Status: AC
  Filled 2016-11-03: qty 5

## 2016-11-03 MED ORDER — HYDROMORPHONE HCL-NACL 0.5-0.9 MG/ML-% IV SOSY: 0.5000 mg | PREFILLED_SYRINGE | INTRAVENOUS | Status: DC | PRN

## 2016-11-03 MED ORDER — ONDANSETRON HCL 4 MG/2ML IJ SOLN
INTRAMUSCULAR | Status: DC | PRN
Start: 2016-11-03 — End: 2016-11-03
  Administered 2016-11-03: 4 mg via INTRAVENOUS

## 2016-11-03 MED ORDER — BUPIVACAINE-EPINEPHRINE (PF) 0.5% -1:200000 IJ SOLN
INTRAMUSCULAR | Status: DC | PRN
  Administered 2016-11-03: 10 mL

## 2016-11-03 MED ORDER — PHENYLEPHRINE HCL 10 MG/ML IJ SOLN
INTRAMUSCULAR | Status: DC | PRN
  Administered 2016-11-03 (×4): 60 ug via INTRAVENOUS

## 2016-11-03 MED ORDER — PREGABALIN 200 MG PO CAPS: 200.0000 mg | ORAL_CAPSULE | Freq: Two times a day (BID) | ORAL | Status: DC

## 2016-11-03 MED ORDER — THROMBIN 5000 UNITS EX SOLR
OROMUCOSAL | Status: DC | PRN
  Administered 2016-11-03: 5 mL via TOPICAL

## 2016-11-03 MED ORDER — CEFAZOLIN SODIUM-DEXTROSE 2-4 GM/100ML-% IV SOLN
2.0000 g | INTRAVENOUS | Status: AC
  Administered 2016-11-03: 2 g via INTRAVENOUS

## 2016-11-03 MED ORDER — ACETAMINOPHEN 650 MG RE SUPP: 650.0000 mg | RECTAL | Status: DC | PRN

## 2016-11-03 MED ORDER — PHENYLEPHRINE HCL 10 MG/ML IJ SOLN
INTRAMUSCULAR | Status: AC
  Filled 2016-11-03: qty 1

## 2016-11-03 MED ORDER — POLYETHYLENE GLYCOL 3350 17 G PO PACK
17.0000 g | PACK | Freq: Every day | ORAL | 0 refills | Status: DC
Start: 2016-11-03 — End: 2018-01-11

## 2016-11-03 MED ORDER — RISAQUAD PO CAPS
1.0000 | ORAL_CAPSULE | Freq: Every day | ORAL | Status: DC
  Administered 2016-11-04: 1 via ORAL
  Filled 2016-11-03 (×2): qty 1

## 2016-11-03 MED ORDER — THROMBIN 5000 UNITS EX SOLR
CUTANEOUS | Status: AC
  Filled 2016-11-03: qty 10000

## 2016-11-03 MED ORDER — PHENYLEPHRINE 40 MCG/ML (10ML) SYRINGE FOR IV PUSH (FOR BLOOD PRESSURE SUPPORT)
PREFILLED_SYRINGE | INTRAVENOUS | Status: AC
  Filled 2016-11-03: qty 10

## 2016-11-03 MED ORDER — ACETAMINOPHEN 10 MG/ML IV SOLN
INTRAVENOUS | Status: AC
  Filled 2016-11-03: qty 100

## 2016-11-03 MED ORDER — PROPOFOL 10 MG/ML IV BOLUS
INTRAVENOUS | Status: DC | PRN
  Administered 2016-11-03: 30 mg via INTRAVENOUS
  Administered 2016-11-03: 150 mg via INTRAVENOUS

## 2016-11-03 MED ORDER — FENTANYL CITRATE (PF) 100 MCG/2ML IJ SOLN: 25.0000 ug | INTRAMUSCULAR | Status: DC | PRN

## 2016-11-03 MED ORDER — KCL IN DEXTROSE-NACL 20-5-0.45 MEQ/L-%-% IV SOLN
INTRAVENOUS | Status: AC
  Administered 2016-11-03: 13:00:00 via INTRAVENOUS
  Filled 2016-11-03 (×2): qty 1000

## 2016-11-03 MED ORDER — SUGAMMADEX SODIUM 200 MG/2ML IV SOLN
INTRAVENOUS | Status: DC | PRN
  Administered 2016-11-03: 200 mg via INTRAVENOUS

## 2016-11-03 MED ORDER — CLINDAMYCIN PHOSPHATE 900 MG/50ML IV SOLN
INTRAVENOUS | Status: AC
  Filled 2016-11-03: qty 50

## 2016-11-03 MED ORDER — LACTATED RINGERS IV SOLN
INTRAVENOUS | Status: DC
  Administered 2016-11-03 (×2): via INTRAVENOUS

## 2016-11-03 MED ORDER — ONDANSETRON HCL 4 MG/2ML IJ SOLN
INTRAMUSCULAR | Status: AC
  Filled 2016-11-03: qty 2

## 2016-11-03 MED ORDER — MENTHOL 3 MG MT LOZG: 1.0000 | LOZENGE | OROMUCOSAL | Status: DC | PRN

## 2016-11-03 MED ORDER — IBUPROFEN 200 MG PO TABS: 600.0000 mg | ORAL_TABLET | Freq: Three times a day (TID) | ORAL | 0 refills | Status: DC | PRN

## 2016-11-03 MED ORDER — MAGNESIUM CITRATE PO SOLN: 1.0000 | Freq: Once | ORAL | Status: DC | PRN

## 2016-11-03 MED ORDER — PREGABALIN 100 MG PO CAPS: 200.0000 mg | ORAL_CAPSULE | Freq: Three times a day (TID) | ORAL | Status: DC

## 2016-11-03 MED ORDER — THROMBIN 5000 UNITS EX SOLR
CUTANEOUS | Status: DC | PRN
  Administered 2016-11-03: 5 mL via TOPICAL

## 2016-11-03 MED ORDER — MIDAZOLAM HCL 5 MG/5ML IJ SOLN
INTRAMUSCULAR | Status: DC | PRN
  Administered 2016-11-03: 2 mg via INTRAVENOUS

## 2016-11-03 MED ORDER — DOCUSATE SODIUM 100 MG PO CAPS: 100.0000 mg | ORAL_CAPSULE | Freq: Two times a day (BID) | ORAL | 1 refills | Status: DC | PRN

## 2016-11-03 MED ORDER — DEXAMETHASONE SODIUM PHOSPHATE 10 MG/ML IJ SOLN
INTRAMUSCULAR | Status: DC | PRN
  Administered 2016-11-03: 10 mg via INTRAVENOUS

## 2016-11-03 MED ORDER — ALUM & MAG HYDROXIDE-SIMETH 200-200-20 MG/5ML PO SUSP: 30.0000 mL | Freq: Four times a day (QID) | ORAL | Status: DC | PRN

## 2016-11-03 MED ORDER — SODIUM CHLORIDE 0.9 % IV SOLN
INTRAVENOUS | Status: AC
  Filled 2016-11-03: qty 500000

## 2016-11-03 MED ORDER — CLINDAMYCIN PHOSPHATE 900 MG/50ML IV SOLN
900.0000 mg | INTRAVENOUS | Status: AC
  Administered 2016-11-03: 900 mg via INTRAVENOUS

## 2016-11-03 MED ORDER — ACETAMINOPHEN 325 MG PO TABS: 650.0000 mg | ORAL_TABLET | ORAL | Status: DC | PRN

## 2016-11-03 MED ORDER — SELEGILINE HCL 5 MG PO CAPS
5.0000 mg | ORAL_CAPSULE | Freq: Two times a day (BID) | ORAL | Status: DC
  Administered 2016-11-03 – 2016-11-04 (×2): 5 mg via ORAL
  Filled 2016-11-03 (×2): qty 1

## 2016-11-03 MED ORDER — BUPIVACAINE-EPINEPHRINE (PF) 0.5% -1:200000 IJ SOLN
INTRAMUSCULAR | Status: AC
  Filled 2016-11-03: qty 30

## 2016-11-03 MED ORDER — SODIUM CHLORIDE 0.9 % IV SOLN
INTRAVENOUS | Status: DC | PRN
  Administered 2016-11-03: 500 mL

## 2016-11-03 MED ORDER — BISACODYL 5 MG PO TBEC
5.0000 mg | DELAYED_RELEASE_TABLET | Freq: Every day | ORAL | Status: DC | PRN
Start: 2016-11-03 — End: 2016-11-04

## 2016-11-03 MED ORDER — PROPOFOL 10 MG/ML IV BOLUS
INTRAVENOUS | Status: AC
  Filled 2016-11-03: qty 20

## 2016-11-03 MED ORDER — CEFAZOLIN SODIUM-DEXTROSE 2-4 GM/100ML-% IV SOLN
INTRAVENOUS | Status: AC
  Filled 2016-11-03: qty 100

## 2016-11-03 MED ORDER — LIDOCAINE 2% (20 MG/ML) 5 ML SYRINGE
INTRAMUSCULAR | Status: AC
  Filled 2016-11-03: qty 5

## 2016-11-03 MED ORDER — ONDANSETRON HCL 4 MG/2ML IJ SOLN: 4.0000 mg | Freq: Once | INTRAMUSCULAR | Status: DC | PRN

## 2016-11-03 MED ORDER — SUGAMMADEX SODIUM 200 MG/2ML IV SOLN
INTRAVENOUS | Status: AC
  Filled 2016-11-03: qty 2

## 2016-11-03 MED ORDER — PHENOL 1.4 % MT LIQD: 1.0000 | OROMUCOSAL | Status: DC | PRN

## 2016-11-03 MED ORDER — PHENYLEPHRINE HCL 10 MG/ML IJ SOLN
INTRAVENOUS | Status: DC | PRN
  Administered 2016-11-03: 10 ug/min via INTRAVENOUS

## 2016-11-03 MED ORDER — PROPRANOLOL HCL 60 MG PO TABS: 60.0000 mg | ORAL_TABLET | Freq: Every day | ORAL | Status: DC

## 2016-11-03 MED ORDER — CARBIDOPA-LEVODOPA 25-100 MG PO TABS
1.5000 | ORAL_TABLET | Freq: Four times a day (QID) | ORAL | Status: DC
  Administered 2016-11-03 – 2016-11-04 (×5): 1.5 via ORAL
  Filled 2016-11-03 (×5): qty 2

## 2016-11-03 SURGICAL SUPPLY — 49 items
BAG SPEC THK2 15X12 ZIP CLS (MISCELLANEOUS) ×1
BAG ZIPLOCK 12X15 (MISCELLANEOUS) ×2 IMPLANT
CLEANER TIP ELECTROSURG 2X2 (MISCELLANEOUS) ×3 IMPLANT
CLOSURE WOUND 1/2 X4 (GAUZE/BANDAGES/DRESSINGS) ×1
CLOTH 2% CHLOROHEXIDINE 3PK (PERSONAL CARE ITEMS) ×3 IMPLANT
COVER SURGICAL LIGHT HANDLE (MISCELLANEOUS) ×3 IMPLANT
DRAPE MICROSCOPE LEICA (MISCELLANEOUS) ×3 IMPLANT
DRAPE SHEET LG 3/4 BI-LAMINATE (DRAPES) IMPLANT
DRAPE SURG 17X11 SM STRL (DRAPES) ×3 IMPLANT
DRAPE UTILITY XL STRL (DRAPES) ×3 IMPLANT
DRSG AQUACEL AG ADV 3.5X 6 (GAUZE/BANDAGES/DRESSINGS) ×2 IMPLANT
DURAPREP 26ML APPLICATOR (WOUND CARE) ×3 IMPLANT
ELECT BLADE TIP CTD 4 INCH (ELECTRODE) IMPLANT
ELECT REM PT RETURN 15FT ADLT (MISCELLANEOUS) ×3 IMPLANT
GLOVE BIOGEL PI IND STRL 7.0 (GLOVE) ×1 IMPLANT
GLOVE BIOGEL PI IND STRL 7.5 (GLOVE) IMPLANT
GLOVE BIOGEL PI INDICATOR 7.0 (GLOVE) ×2
GLOVE BIOGEL PI INDICATOR 7.5 (GLOVE) ×6
GLOVE SURG SS PI 7.0 STRL IVOR (GLOVE) ×3 IMPLANT
GLOVE SURG SS PI 8.0 STRL IVOR (GLOVE) ×6 IMPLANT
GOWN STRL REUS W/TWL XL LVL3 (GOWN DISPOSABLE) ×8 IMPLANT
IV CATH 14GX2 1/4 (CATHETERS) ×2 IMPLANT
KIT BASIN OR (CUSTOM PROCEDURE TRAY) ×3 IMPLANT
KIT POSITIONING SURG ANDREWS (MISCELLANEOUS) ×3 IMPLANT
MANIFOLD NEPTUNE II (INSTRUMENTS) ×3 IMPLANT
MARKER SKIN DUAL TIP RULER LAB (MISCELLANEOUS) ×3 IMPLANT
NDL SPNL 18GX3.5 QUINCKE PK (NEEDLE) ×2 IMPLANT
NEEDLE SPNL 18GX3.5 QUINCKE PK (NEEDLE) ×6 IMPLANT
PACK LAMINECTOMY ORTHO (CUSTOM PROCEDURE TRAY) ×3 IMPLANT
PATTIES SURGICAL .5 X.5 (GAUZE/BANDAGES/DRESSINGS) ×3 IMPLANT
PATTIES SURGICAL .75X.75 (GAUZE/BANDAGES/DRESSINGS) ×3 IMPLANT
PATTIES SURGICAL 1X1 (DISPOSABLE) IMPLANT
RUBBERBAND STERILE (MISCELLANEOUS) ×3 IMPLANT
SPONGE LAP 4X18 X RAY DECT (DISPOSABLE) IMPLANT
SPONGE SURGIFOAM ABS GEL 100 (HEMOSTASIS) ×3 IMPLANT
STAPLER VISISTAT (STAPLE) IMPLANT
STRIP CLOSURE SKIN 1/2X4 (GAUZE/BANDAGES/DRESSINGS) ×1 IMPLANT
SUT NURALON 4 0 TR CR/8 (SUTURE) IMPLANT
SUT PROLENE 3 0 PS 2 (SUTURE) ×2 IMPLANT
SUT VIC AB 1 CT1 27 (SUTURE) ×3
SUT VIC AB 1 CT1 27XBRD ANTBC (SUTURE) IMPLANT
SUT VIC AB 1-0 CT2 27 (SUTURE) IMPLANT
SUT VIC AB 2-0 CT1 27 (SUTURE)
SUT VIC AB 2-0 CT1 TAPERPNT 27 (SUTURE) IMPLANT
SUT VIC AB 2-0 CT2 27 (SUTURE) ×2 IMPLANT
SYR 3ML LL SCALE MARK (SYRINGE) ×2 IMPLANT
TOWEL OR 17X26 10 PK STRL BLUE (TOWEL DISPOSABLE) ×3 IMPLANT
TOWEL OR NON WOVEN STRL DISP B (DISPOSABLE) ×3 IMPLANT
YANKAUER SUCT BULB TIP NO VENT (SUCTIONS) ×3 IMPLANT

## 2016-11-03 NOTE — Transfer of Care (Signed)
Immediate Anesthesia Transfer of Care Note  Patient: John Norton  Procedure(s) Performed: Procedure(s) with comments: Microlumbar Decompression L4-5, L5-S1 (N/A) - 120 mins  Patient Location: PACU  Anesthesia Type:General  Level of Consciousness: awake, alert  and oriented  Airway & Oxygen Therapy: Patient Spontanous Breathing and Patient connected to face mask oxygen  Post-op Assessment: Report given to RN and Post -op Vital signs reviewed and stable  Post vital signs: Reviewed and stable  Last Vitals:  Vitals:   11/03/16 0535  BP: (!) 158/86  Pulse: 65  Resp: 18  Temp: 36.6 C  SpO2: 100%    Last Pain:  Vitals:   11/03/16 0609  TempSrc:   PainSc: 2       Patients Stated Pain Goal: 3 (68/37/29 0211)  Complications: No apparent anesthesia complications

## 2016-11-03 NOTE — Discharge Instructions (Signed)

## 2016-11-03 NOTE — Anesthesia Postprocedure Evaluation (Signed)
Anesthesia Post Note  Patient: John Norton  Procedure(s) Performed: Procedure(s) (LRB): Microlumbar Decompression L4-5, L5-S1 (N/A)     Patient location during evaluation: PACU Anesthesia Type: General Level of consciousness: awake, awake and alert and oriented Pain management: pain level controlled Vital Signs Assessment: post-procedure vital signs reviewed and stable Respiratory status: spontaneous breathing, nonlabored ventilation and respiratory function stable Cardiovascular status: blood pressure returned to baseline Anesthetic complications: no    Last Vitals:  Vitals:   11/03/16 1347 11/03/16 1820  BP: (!) 146/79 (!) 141/74  Pulse: 69 77  Resp: 16 15  Temp: (!) 36.4 C 36.7 C  SpO2: 98% 99%    Last Pain:  Vitals:   11/03/16 1820  TempSrc: Oral  PainSc:                  Analeise Mccleery COKER

## 2016-11-03 NOTE — Anesthesia Procedure Notes (Signed)
Procedure Name: Intubation Date/Time: 11/03/2016 7:41 AM Performed by: Glory Buff Pre-anesthesia Checklist: Patient identified, Emergency Drugs available, Suction available and Patient being monitored Patient Re-evaluated:Patient Re-evaluated prior to induction Oxygen Delivery Method: Circle system utilized Preoxygenation: Pre-oxygenation with 100% oxygen Induction Type: IV induction Ventilation: Mask ventilation without difficulty Laryngoscope Size: Miller and 3 Grade View: Grade II Tube type: Oral Tube size: 7.5 mm Number of attempts: 1 Airway Equipment and Method: Stylet and Oral airway Placement Confirmation: ETT inserted through vocal cords under direct vision,  positive ETCO2 and breath sounds checked- equal and bilateral Secured at: 21 cm Tube secured with: Tape Dental Injury: Teeth and Oropharynx as per pre-operative assessment

## 2016-11-03 NOTE — Evaluation (Signed)
Physical Therapy Evaluation Patient Details Name: John Norton MRN: [id number] DOB: [birth year] Today's Date: 11/03/2016   History of Present Illness  Pt s/p L4-5, L5-S1 micro-lumbar decompression and with hx of LBBB and Parkinsons  Clinical Impression  Pt s/p back surgery and presents with functional mobility limitations 2* post op pain and back precautions.  Pt should progress to dc home with family assist.    Follow Up Recommendations No PT follow up    Equipment Recommendations  None recommended by PT    Recommendations for Other Services OT consult     Precautions / Restrictions Precautions Precautions: Back;Fall Precaution Booklet Issued: Yes (comment) Precaution Comments: Back precautions reviewed x 2 Restrictions Weight Bearing Restrictions: No      Mobility  Bed Mobility Overal bed mobility: Needs Assistance Bed Mobility: Supine to Sit     Supine to sit: Min guard     General bed mobility comments: cues for adherence to back precautions and correct log roll technique  Transfers Overall transfer level: Needs assistance Equipment used: None Transfers: Sit to/from Stand Sit to Stand: Min assist;Min guard         General transfer comment: cues for transition position, adherence to back precautions and use of UEs to self assist  Ambulation/Gait Ambulation/Gait assistance: Min guard Ambulation Distance (Feet): 250 Feet Assistive device: Rolling walker (2 wheeled);None Gait Pattern/deviations: Step-through pattern;Decreased step length - right;Decreased step length - left;Shuffle;Trunk flexed;Narrow base of support Gait velocity: decr Gait velocity interpretation: Below normal speed for age/gender General Gait Details: pt ambulated 125' with RW and additional 125' sans AD   Stairs            Wheelchair Mobility    Modified Rankin (Stroke Patients Only)       Balance                                              Pertinent Vitals/Pain Pain Assessment: 0-10 Pain Score: 3  Pain Location: back Pain Descriptors / Indicators: Burning;Sore Pain Intervention(s): Limited activity within patient's tolerance;Monitored during session;Premedicated before session    Home Living Family/patient expects to be discharged to:: Private residence Living Arrangements: Spouse/significant other;Other relatives Available Help at Discharge: Family Type of Home: House Home Access: Stairs to enter   [employer] of Steps: 2 vs 7  Home Layout: One level Home Equipment: Environmental consultant - standard;Crutches      Prior Function Level of Independence: Independent               Hand Dominance        Extremity/Trunk Assessment   Upper Extremity Assessment Upper Extremity Assessment: Overall WFL for tasks assessed    Lower Extremity Assessment Lower Extremity Assessment: Overall WFL for tasks assessed       Communication   Communication: No difficulties  Cognition Arousal/Alertness: Awake/alert Behavior During Therapy: WFL for tasks assessed/performed Overall Cognitive Status: Within Functional Limits for tasks assessed                                        General Comments      Exercises     Assessment/Plan    PT Assessment Patient needs continued PT services  PT Problem List Decreased activity tolerance;Decreased mobility;Decreased knowledge of use of DME;Pain;Decreased knowledge of  precautions       PT Treatment Interventions DME instruction;Gait training;Stair training;Functional mobility training;Therapeutic activities;Therapeutic exercise;Patient/family education    PT Goals (Current goals can be found in the Care Plan section)  Acute Rehab PT Goals Patient Stated Goal: Resume previous lifestyle with decreased pain PT Goal Formulation: With patient Time For Goal Achievement: 11/05/16 Potential to Achieve Goals: Good    Frequency 7X/week   Barriers to  discharge        Co-evaluation               AM-PAC PT "6 Clicks" Daily Activity  Outcome Measure Difficulty turning over in bed (including adjusting bedclothes, sheets and blankets)?: A Little Difficulty moving from lying on back to sitting on the side of the bed? : A Lot Difficulty sitting down on and standing up from a chair with arms (e.g., wheelchair, bedside commode, etc,.)?: A Little Help needed moving to and from a bed to chair (including a wheelchair)?: A Little Help needed walking in hospital room?: A Little Help needed climbing 3-5 steps with a railing? : A Little 6 Click Score: 17    End of Session   Activity Tolerance: Patient tolerated treatment well Patient left: in chair;with call bell/phone within reach;with family/visitor present Nurse Communication: Mobility status PT Visit Diagnosis: Difficulty in walking, not elsewhere classified (R26.2)    Time: 1540-1606 PT Time Calculation (min) (ACUTE ONLY): 26 min   Charges:   PT Evaluation $PT Eval Low Complexity: 1 Low PT Treatments $Gait Training: 8-22 mins   PT G Codes:   PT G-Codes **NOT FOR INPATIENT CLASS** Functional Assessment Tool Used: Clinical judgement Functional Limitation: Mobility: Walking and moving around Mobility: Walking and Moving Around Current Status (C1660): At least 20 percent but less than 40 percent impaired, limited or restricted Mobility: Walking and Moving Around Goal Status [phone]): At least 1 percent but less than 20 percent impaired, limited or restricted    Pg [phone]   John Norton 11/03/2016, 5:19 PM

## 2016-11-03 NOTE — H&P (View-Only) (Signed)
John Norton is an 61 y.o. male.   Chief Complaint: back and leg pain HPI: The patient is a 61 year old male. The patient is here today in referral from Dr. Nelva Bush. The patient reports low back symptoms including pain and right leg pain which began year(s) ago without any known injury. and Symptoms include decreased range of motion. The pain radiates to the right buttock, right posterior thigh and right lower leg. The patient describes the pain as sharp, dull and aching. The symptom onset was gradual. The patient describes the severity of their symptoms as 2 / 10 (states it is better since taking the Prednisone). The patient feels as if the symptoms are worsening ("some days my pain will be a 9 or a 10, and that usually happens if I have to walk a lot"). Symptoms are exacerbated by standing and sitting. Symptoms are relieved by rest and recumbency. Current treatment includes nonsteroidal anti-inflammatory drugs. Prior to being seen today the patient was previously evaluated in this clinic. Symptoms present at the patient's previous evaluation included back pain. Past evaluation has included x-ray of the lumbar spine. Past treatment has included corticosteroids and Right L5 SNRB on 08/24/16 and R L5-S1 ESI on 07/13/16 (Patient states that neither one of the shots really helped him that much.).  John Norton is here as kind surgical referral from Dr. Nelva Bush. He has had a long history of leg pain, buttock and leg. Minimal back pain. It is right greater than left, years of duration, worse as recently he has noticed weakness on both sides. He does also have a history of Parkinson's as well.  He feels this is secondary to this. He was also diagnosed with idiopathic peripheral neuropathy.  He has reported no fevers or chills associated with this. No rashes associated with this.  It is worse when he stands and he attempts to walk, i.e. at [employer] he is unable to. He sits and he has immediate relief of  symptoms.  Dr. Nelva Bush has done injections right L5 selective nerve root block with only temporary relief. He has tried Lyrica without any help. He has been in pain management chronically.  He had an MRI, which indicated stenosis at L4-5.  Again reporting buttock and leg pain is significantly worse and only minor back pain.  He is continued with significant symptomatology.  Past Medical History:  Diagnosis Date  . ED (erectile dysfunction)   . Kidney stone   . LBBB (left bundle branch block)   . Neuropathy    trigeminal pain  . Parkinson's disease   . Plantar fasciitis 2017  . Sleep apnea    does not use cpap    Past Surgical History:  Procedure Laterality Date  . "spot on lung"    . CHEST X-RAY  08/21/10   No cardiopulmonary process noted.  . CHOLECYSTECTOMY N/A 04/20/2012   Procedure: LAPAROSCOPIC CHOLECYSTECTOMY;  Surgeon: Jamesetta So, MD;  Location: [facility];  Service: General;  Laterality: N/A;  . [place]  . LEXISCAN MYOVIEW  08/22/10    There is no stress-induced ischemia,  but a fixed deficit in the lateral apex and inferior wall.  Ejection fraction 51%  . THYROID LOBECTOMY     RIGHT THYROID    Family History  Problem Relation Age of Onset  . Colon polyps Brother   . Colon cancer Neg Hx   . Rectal cancer Neg Hx   . Stomach cancer Neg Hx   .  Esophageal cancer Neg Hx    Social History:  reports that he quit smoking about 6 years ago. His smoking use included Cigarettes. He has a 10.00 pack-year smoking history. His smokeless tobacco use includes Chew. He reports that he does not drink alcohol or use drugs.  Allergies:  Allergies  Allergen Reactions  . Codeine Nausea Only     (Not in a hospital admission)  No results found for this or any previous visit (from the past 48 hour(s)). No results found.  Review of Systems  Constitutional: Negative.   HENT: Negative.   Eyes: Negative.   Respiratory: Negative.   Cardiovascular: Negative.    Gastrointestinal: Negative.   Genitourinary: Negative.   Musculoskeletal: Positive for back pain.  Skin: Negative.   Neurological: Positive for sensory change and focal weakness.  Psychiatric/Behavioral: Negative.     There were no vitals taken for this visit. Physical Exam  Constitutional: He is oriented to person, place, and time. He appears well-developed and well-nourished.  HENT:  Head: Normocephalic.  Eyes: Pupils are equal, round, and reactive to light.  Neck: Normal range of motion.  Cardiovascular: Normal rate.   Respiratory: Effort normal.  GI: Soft.  Musculoskeletal:  He is a healthy male. He stands and has pain in the right lower extremity, also into the left. Straight leg raise with buttock, thigh, and calf pain on the right, negative on the left. He has EHL 4+/5 bilaterally. His pain with extension into the legs is relieved actually with sitting and with forward flexion. He does walk with a slight parkinsonian walk, but has more of a tremor type parkinsonian.  Inspection of the cervical spine reveals a normal lordosis without evidence of paraspinous spasms or soft tissue swelling. Nontender to palpation. Full flexion, full extension, full left and right lateral rotation. Extension combined with lateral flexion does not reproduce pain. Negative impingement sign, negative secondary impingement sign of the shoulders. Negative Tinel's median and ulnar nerves at the elbow. Negative carpal compression test at the wrist. Motor of the upper extremities is 5/5 including biceps, triceps, brachioradialis, wrist flexion, wrist extension, finger flexion, finger extension. Reflexes are normoreflexic. Sensory exam is intact to light touch. There is no Hoffmann sign. Nontender over the thoracic spine. He has a slight intention tremor.  Neurological: He is alert and oriented to person, place, and time.  Skin: Skin is warm and dry.    Three-view radiographs, [facility], lateral, flexion and extension  of the lumbar spine demonstrate a scoliosis noted at L1 to L5 of 13-1/2 degrees. Dextrorotary. Hips are unremarkable. He has disc degeneration near end stage at L4-5 and at L5-S1. Calcification of the aorta is noted. No instability in flexion or extension.  MRI was reviewed from 09/07/2016. It demonstrates multifactorial stenosis at L4-5 severe with facet hypertrophy with predominantly ligamentum flavum hypertrophy, although some facet hypertrophy on the left. He has neural foraminal stenosis, left actually greater than right.  At L5-S1, he has lateral recess stenosis on the right with neural foraminal narrowing.  Assessment/Plan 1. 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 2. Peripheral neuropathy. 3. Parkinson's disease. 4. 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 5. Peripheral vascular disease.  I had extensive discussion with Mr. Uhlig concerning his current pathology, relevant anatomy, and treatment options. It is a [employer] duration. He has predominantly a radicular pain that is worse with standing in L5 nerve root distribution. They indicate that MRI will underestimate the  degree of stenosis given that it is in the supine position and unloaded. It is apparent given his current symptomatology that it is immediately relieved when he sits. Typically, idiopathic neuropathy symptoms are typically persistent and not possibly dependent. He has had symptoms relieved by a steroid. Again, he has an end-stage disc degeneration at L4-5 and at L5-S1. Minimal back pain. We discussed decompression at L4-5 of the lateral recess as well as at L5-S1 on the right and foraminotomy of L5 as a micro lumbar decompression. Given his disc degeneration, he has a convexity to the right, which should open the neural foramen and preclude the need for a surgical fusion. However, I did indicate he may require a multilevel instrumented fusion  with distraction of the disc space, etc. There should be initial conservative approach given that he does have complete relief when he sits and he has minimal back pain. The foramen appeared to be not statically compressing the nerve root on the right. He does have facet arthropathy on the right as well at L5-S1.  I had an extensive discussion of the risks and benefits of the lumbar decompression with the patient including bleeding, infection, damage to neurovascular structures, epidural fibrosis, CSF leak requiring repair. We also discussed increase in pain, adjacent segment disease, recurrent disc herniation, need for future surgery including repeat decompression and/or fusion. We also discussed risks of postoperative hematoma, paralysis, anesthetic complications including DVT, PE, death, cardiopulmonary dysfunction. In addition, the perioperative and postoperative courses were discussed in detail including the rehabilitative time and return to functional activity and work. I provided the patient with an illustrated handout and utilized the appropriate surgical models.  We discussed perioperative management of Parkinson's avoiding anticholinergics as well as antispasmodics. Certainly, we discussed preoperative clearance overnight in the hospital, six weeks for surgical healing and then an additional six weeks for reconditioning. Given his Parkinson's and the EHL weakness, this I feel is aggravating would be compounding his fall risk if it progresses into a full footdrop. Again, we will have him undergo preoperative clearance by his neurologist in [place]. I appreciate the kind referral.  Plan microlumbar decompression L4-5, L5-S1  Audra Bellard, Conley Rolls., PA-C for Dr. Tonita Cong 10/25/2016, 9:27 AM

## 2016-11-03 NOTE — Anesthesia Preprocedure Evaluation (Addendum)
Anesthesia Evaluation  Patient identified by MRN, date of birth, ID band Patient awake    Reviewed: Allergy & Precautions, NPO status , Patient's Chart, lab work & pertinent test results  Airway Mallampati: II  TM Distance: >3 FB Neck ROM: Full    Dental  (+) Teeth Intact, Dental Advisory Given   Pulmonary former smoker,    breath sounds clear to auscultation       Cardiovascular  Rhythm:Regular Rate:Normal     Neuro/Psych    GI/Hepatic   Endo/Other    Renal/GU      Musculoskeletal   Abdominal   Peds  Hematology   Anesthesia Other Findings   Reproductive/Obstetrics                             Anesthesia Physical Anesthesia Plan  ASA: III  Anesthesia Plan: General   Post-op Pain Management:    Induction: Intravenous  PONV Risk Score and Plan: Ondansetron and Dexamethasone  Airway Management Planned: Oral ETT  Additional Equipment:   Intra-op Plan:   Post-operative Plan: Extubation in OR  Informed Consent: I have reviewed the patients History and Physical, chart, labs and discussed the procedure including the risks, benefits and alternatives for the proposed anesthesia with the patient or authorized representative who has indicated his/her understanding and acceptance.   Dental advisory given  Plan Discussed with: CRNA and Anesthesiologist  Anesthesia Plan Comments:         Anesthesia Quick Evaluation  

## 2016-11-03 NOTE — Interval H&P Note (Signed)
History and Physical Interval Note:  11/03/2016 7:32 AM  John Norton  has presented today for surgery, with the diagnosis of Stenosis L4-5, L5-S1  The various methods of treatment have been discussed with the patient and family. After consideration of risks, benefits and other options for treatment, the patient has consented to  Procedure(s) with comments: Microlumbar Decompression L4-5, L5-S1 (N/A) - 120 mins as a surgical intervention .  The patient's history has been reviewed, patient examined, no change in status, stable for surgery.  I have reviewed the patient's chart and labs.  Questions were answered to the patient's satisfaction.     Cantrell Martus C

## 2016-11-03 NOTE — Brief Op Note (Signed)
11/03/2016  9:28 AM  PATIENT:  John Norton  61 y.o. male  PRE-OPERATIVE DIAGNOSIS:  Stenosis L4-5, L5-S1  POST-OPERATIVE DIAGNOSIS:  Stenosis L4-5, L5-S1  PROCEDURE:  Procedure(s) with comments: Microlumbar Decompression L4-5, L5-S1 (N/A) - 120 mins  SURGEON:  Surgeon(s) and Role:    Susa Day, MD - Primary  PHYSICIAN ASSISTANT:   ASSISTANTS: Bissel   ANESTHESIA:   general  EBL:  Total I/O In: -  Out: 100 [Blood:100]  BLOOD ADMINISTERED:none  DRAINS: none   LOCAL MEDICATIONS USED:  MARCAINE     SPECIMEN:  No Specimen  DISPOSITION OF SPECIMEN:  N/A  COUNTS:  YES  TOURNIQUET:  * No tourniquets in log *  DICTATION: .Other Dictation: Dictation Number [id number]  PLAN OF CARE: Admit for overnight observation  PATIENT DISPOSITION:  PACU - hemodynamically stable.   Delay start of Pharmacological VTE agent (>24hrs) due to surgical blood loss or risk of bleeding: yes

## 2016-11-03 NOTE — Progress Notes (Signed)
Pt progressing well after spinal surg today. Able to ambulate to chair and sit up in bed. Tolerating reg diet. Pain controlled with po meds. Moderate draining to post-op dressing, reinforced with abd. Wife at bedside.

## 2016-11-04 DIAGNOSIS — Z87891 Personal history of nicotine dependence: Secondary | ICD-10-CM | POA: Diagnosis not present

## 2016-11-04 DIAGNOSIS — Z79899 Other long term (current) drug therapy: Secondary | ICD-10-CM | POA: Diagnosis not present

## 2016-11-04 DIAGNOSIS — I700 Atherosclerosis of aorta: Secondary | ICD-10-CM | POA: Diagnosis not present

## 2016-11-04 DIAGNOSIS — M4807 Spinal stenosis, lumbosacral region: Secondary | ICD-10-CM | POA: Diagnosis not present

## 2016-11-04 DIAGNOSIS — Z7982 Long term (current) use of aspirin: Secondary | ICD-10-CM | POA: Diagnosis not present

## 2016-11-04 DIAGNOSIS — M48061 Spinal stenosis, lumbar region without neurogenic claudication: Secondary | ICD-10-CM | POA: Diagnosis not present

## 2016-11-04 DIAGNOSIS — G4730 Sleep apnea, unspecified: Secondary | ICD-10-CM | POA: Diagnosis not present

## 2016-11-04 DIAGNOSIS — G20 Parkinson's disease: Secondary | ICD-10-CM | POA: Diagnosis not present

## 2016-11-04 LAB — BASIC METABOLIC PANEL
Anion gap: 9 (ref 5–15)
BUN: 15 mg/dL (ref 6–20)
CO2: 25 mmol/L (ref 22–32)
CREATININE: 1.12 mg/dL (ref 0.61–1.24)
Calcium: 9.1 mg/dL (ref 8.9–10.3)
Chloride: 104 mmol/L (ref 101–111)
GFR calc Af Amer: >60 mL/min (ref >60)
GFR calc non Af Amer: >60 mL/min (ref >60)
Glucose, Bld: 187 mg/dL — ABNORMAL HIGH (ref 65–99)
Potassium: 4.2 mmol/L (ref 3.5–5.1)
SODIUM: 138 mmol/L (ref 135–145)

## 2016-11-04 MED ORDER — SODIUM CHLORIDE 0.9 % IV SOLN: INTRAVENOUS | Status: DC

## 2016-11-04 NOTE — Discharge Summary (Signed)
Physician Discharge Summary   Patient ID: John Norton MRN: [id number] DOB/AGE: 1955-06-13 61 y.o.  Admit date: 11/03/2016 Discharge date: 11/04/2016  Primary Diagnosis:   Stenosis L4-5, L5-S1  Admission Diagnoses:  Past Medical History:  Diagnosis Date  . ED (erectile dysfunction)   . History of kidney stones YRS AGO  . LBBB (left bundle branch block)   . Neuropathy    FOREHEAD BURNING AND NUMBNESS  . Parkinson's disease   . Plantar fasciitis 2017   IMPROVED  . Sleep apnea    does not use cpap, NO SLEEP APNEA SINCE QUIT SMOKING 2012  . Spinal stenosis   . Tremors of nervous system    Discharge Diagnoses:   Principal Problem:   Spinal stenosis of lumbar region Active Problems:   Spinal stenosis at L4-L5 level  Procedure:  Procedure(s) (LRB): Microlumbar Decompression L4-5, L5-S1 (N/A)   Consults: none  HPI:  see H&P    Laboratory Data: Hospital Outpatient Visit on 10/28/2016  Component Date Value Ref Range Status  . MRSA, PCR 10/28/2016 NEGATIVE  NEGATIVE Final  . Staphylococcus aureus 10/28/2016 POSITIVE* NEGATIVE Final   Comment: (NOTE) The Xpert SA Assay (FDA approved for NASAL specimens in patients 10 years of age and older), is one component of a comprehensive surveillance program. It is not intended to diagnose infection nor to guide or monitor treatment.    No results for input(s): HGB in the last 72 hours. No results for input(s): WBC, RBC, HCT, PLT in the last 72 hours.  Recent Labs  11/04/16 0434  NA 138  K 4.2  CL 104  CO2 25  BUN 15  CREATININE 1.12  GLUCOSE 187*  CALCIUM 9.1   No results for input(s): LABPT, INR in the last 72 hours.  X-Rays:Dg Lumbar Spine 2-3 Views  Result Date: 10/28/2016 CLINICAL DATA:  Preoperative examination for micro lumbar decompression EXAM: LUMBAR SPINE - 2-3 VIEW COMPARISON:  06/13/2016 ; CT abdomen and pelvis - 10/21/2011 FINDINGS: There are 5 non rib-bearing lumbar type vertebral bodies There is a  moderate scoliotic curvature of the thoracolumbar spine with dominant caudal component convex the right measuring approximately 20 degrees (as measured from the superior endplate of L1 to the inferior endplate of L5), similar to the 05/2016 examination. No definite anterolisthesis or retrolisthesis. Lumbar vertebral body heights appear preserved given obliquity. Moderate to severe multilevel lumbar spine DDD, worse at L4-L5 and L5-S1 with disc space height loss, endplate irregularity and sclerosis, similar to the 05/2016 examination Limited visualization the bilateral SI joints and hips is normal. Calcified atherosclerotic plaque within the abdominal aorta. Large colonic stool burden. Post cholecystectomy. IMPRESSION: 1. Moderate to severe multilevel lumbar spine DDD, worse at L4-L5 and L5-S1, similar to the 05/2016 examination. 2.  Aortic Atherosclerosis (ICD10-I70.0). Electronically Signed   By: Sandi Mariscal M.D.   On: 10/28/2016 14:44   Dg Spine Portable 1 View  Result Date: 11/03/2016 CLINICAL DATA:  61 year old male with a history of spine surgery EXAM: PORTABLE SPINE - 1 VIEW COMPARISON:  11/03/2016, 10/28/2016 FINDINGS: Limited intraoperative cross-table lateral images of the lumbar spine. Image labeled 3 demonstrates tissue re- tractor posterior to the L5 vertebral body with superior surgical probe identifying the L4 pedicle and the inferior surgical probe at the sacral base. There has been interval L5 laminectomy. IMPRESSION: Limited intraoperative cross-table lateral the lumbar spine, with surgical probes identifying the L4-L5 and L5-S1 levels, as above. Please refer to the dictated operative report for full details of intraoperative findings  and procedure. Electronically Signed   By: Corrie Mckusick D.O.   On: 11/03/2016 09:30   Dg Spine Portable 1 View  Result Date: 11/03/2016 CLINICAL DATA:  Surgery, elective.  Surgical level L4-5 and L5-S1. EXAM: PORTABLE SPINE - 1 VIEW COMPARISON:  One-view lumbar  spine x-ray from the same day. FINDINGS: Surgical clamps are presents L4 and L5 and at L5 and S1 spinous processes. AP alignment is stable. Atherosclerotic changes are present. IMPRESSION: Intraoperative localization of L4-5 and L5-S1. Electronically Signed   By: San Morelle M.D.   On: 11/03/2016 08:24   Dg Spine Portable 1 View  Result Date: 11/03/2016 CLINICAL DATA:  Surgery at L4-5 and L5-S1 EXAM: PORTABLE SPINE - 1 VIEW COMPARISON:  Lumbar spine films of 10/28/2016 FINDINGS: Image 1 shows needles positioned posteriorly directed toward the L4 and L5 spinous processes. IMPRESSION: Needles directed toward the spinous processes of L4 and L5. Electronically Signed   By: Ivar Drape M.D.   On: 11/03/2016 08:05    EKG: Orders placed or performed in visit on 10/10/16  . EKG 12-Lead     Hospital Course: Patient was admitted to [facility] and taken to the OR and underwent the above state procedure without complications.  Patient tolerated the procedure well and was later transferred to the recovery room and then to the orthopaedic floor for postoperative care.  They were given PO and IV analgesics for pain control following their surgery.  They were given 24 hours of postoperative antibiotics.   PT was consulted postop to assist with mobility and transfers.  The patient was allowed to be WBAT with therapy and was taught back precautions. Discharge planning was consulted to help with postop disposition and equipment needs.  Patient had a good night on the evening of surgery and started to get up OOB with therapy on day one. Patient was seen in rounds and was ready to go home on day one.  They were given discharge instructions and dressing directions.  They were instructed on when to follow up in the office with Dr. Tonita Cong.   Diet: Regular diet Activity:WBAT Follow-up:in 10-14 days Disposition - Home Discharged Condition: good   Discharge Instructions    Call MD / Call 911    Complete  by:  As directed    If you experience chest pain or shortness of breath, CALL 911 and be transported to the hospital emergency room.  If you develope a fever above 101 F, pus (white drainage) or increased drainage or redness at the wound, or calf pain, call your surgeon's office.   Constipation Prevention    Complete by:  As directed    Drink plenty of fluids.  Prune juice may be helpful.  You may use a stool softener, such as Colace (over the counter) 100 mg twice a day.  Use MiraLax (over the counter) for constipation as needed.   Diet - low sodium heart healthy    Complete by:  As directed    Increase activity slowly as tolerated    Complete by:  As directed      Allergies as of 11/04/2016      Reactions   Codeine Nausea Only      Medication List    TAKE these medications   Amantadine HCl 100 MG tablet Take 100 mg by mouth 3 (three) times daily.   aspirin 81 MG EC tablet Take 1 tablet (81 mg total) by mouth daily. Resume 4 days post-op What changed:  additional instructions   carbidopa-levodopa 25-100 MG tablet Commonly known as:  SINEMET IR Take 1.5 tablets by mouth 4 (four) times daily.   docusate sodium 100 MG capsule Commonly known as:  COLACE Take 1 capsule (100 mg total) by mouth 2 (two) times daily as needed for mild constipation.   ibuprofen 200 MG tablet Commonly known as:  ADVIL,MOTRIN Take 3 tablets (600 mg total) by mouth every 8 (eight) hours as needed for headache or mild pain. Resume 5 days post-op as needed What changed:  additional instructions   oxyCODONE-acetaminophen 5-325 MG tablet Commonly known as:  PERCOCET Take 1 tablet by mouth every 4 (four) hours as needed for severe pain.   polyethylene glycol packet Commonly known as:  MIRALAX / GLYCOLAX Take 17 g by mouth daily.   pregabalin 200 MG capsule Commonly known as:  LYRICA Take 1 capsule (200 mg total) by mouth 2 (two) times daily.   propranolol ER 60 MG 24 hr capsule Commonly known as:   INDERAL LA Take 60 mg by mouth daily.   selegiline 5 MG capsule Commonly known as:  ELDEPRYL Take 1 capsule by mouth 2 (two) times daily.   sildenafil 100 MG tablet Commonly known as:  VIAGRA Take 100 mg by mouth daily as needed for erectile dysfunction.            Discharge Care Instructions        Start     Ordered   11/04/16 0000  Call MD / Call 911    Comments:  If you experience chest pain or shortness of breath, CALL 911 and be transported to the hospital emergency room.  If you develope a fever above 101 F, pus (white drainage) or increased drainage or redness at the wound, or calf pain, call your surgeon's office.   11/04/16 1150   11/04/16 0000  Diet - low sodium heart healthy     11/04/16 1150   11/04/16 0000  Constipation Prevention    Comments:  Drink plenty of fluids.  Prune juice may be helpful.  You may use a stool softener, such as Colace (over the counter) 100 mg twice a day.  Use MiraLax (over the counter) for constipation as needed.   11/04/16 1150   11/04/16 0000  Increase activity slowly as tolerated     11/04/16 1150   11/03/16 0000  docusate sodium (COLACE) 100 MG capsule  2 times daily PRN     11/03/16 0939   11/03/16 0000  polyethylene glycol (MIRALAX / GLYCOLAX) packet  Daily     11/03/16 0939   11/03/16 0000  oxyCODONE-acetaminophen (PERCOCET) 5-325 MG tablet  Every 4 hours PRN     11/03/16 0939   11/03/16 0000  aspirin 81 MG EC tablet  Daily     11/03/16 0955   11/03/16 0000  ibuprofen (ADVIL,MOTRIN) 200 MG tablet  Every 8 hours PRN     11/03/16 0955   11/03/16 0000  pregabalin (LYRICA) 200 MG capsule  2 times daily    Comments:  Not to exceed 5 additional fills before 06/26/2016.   11/03/16 1124     Follow-up Information    Susa Day, MD Follow up in 2 week(s).   Specialty:  Orthopedic Surgery Contact information: [address] [place] [address] [phone]           Signed: Lacie Draft,  PA-C Orthopaedic Surgery 11/04/2016, 11:50 AM

## 2016-11-04 NOTE — Progress Notes (Signed)
Pt discharged home with all personal belongings, paper rx's, and 3-in1 BSC. Discharge education complete, instructions sent with pt.

## 2016-11-04 NOTE — Progress Notes (Signed)
Physical Therapy Treatment Patient Details Name: John Norton MRN: [id number] DOB: Feb 20, 1956 Today's Date: 11/04/2016    History of Present Illness Pt s/p L4-5, L5-S1 micro-lumbar decompression and with hx of LBBB and Parkinsons    PT Comments    Spouse present during session to address stair training.  Follow Up Recommendations  No PT follow up     Equipment Recommendations  None recommended by PT    Recommendations for Other Services       Precautions / Restrictions Precautions Precautions: Back;Fall Precaution Booklet Issued: Yes (comment) Precaution Comments: pt aware of all precautions Restrictions Weight Bearing Restrictions: No    Mobility  Bed Mobility Overal bed mobility: Needs Assistance Bed Mobility: Rolling;Sidelying to Sit Rolling: Min guard Sidelying to sit: Min guard       General bed mobility comments: OOB in recliner  Transfers Overall transfer level: Needs assistance Equipment used: None Transfers: Sit to/from Stand Sit to Stand: Supervision         General transfer comment: cues for transition position and hand placement; Pt demonstrates good carryover of back precautions during sit<>stand   increased time  Ambulation/Gait Ambulation/Gait assistance: Supervision Ambulation Distance (Feet): 110 Feet Assistive device: Rolling walker (2 wheeled);None Gait Pattern/deviations: Step-through pattern;Decreased step length - right;Decreased step length - left;Shuffle;Trunk flexed;Narrow base of support Gait velocity: decr   General Gait Details: decreased amb distance as tx session focus was stairs   Stairs Stairs: Yes   Stair Management: One rail Right;Alternating pattern;Forwards Number of Stairs: 12 General stair comments: with spouse present for "hands on" instruction on safe handling and walker assist  Wheelchair Mobility    Modified Rankin (Stroke Patients Only)       Balance                                             Cognition Arousal/Alertness: Awake/alert Behavior During Therapy: WFL for tasks assessed/performed Overall Cognitive Status: Within Functional Limits for tasks assessed                                 General Comments: easily distracted, requires redirection throughout      Exercises      General Comments        Pertinent Vitals/Pain Pain Assessment: 0-10 Pain Score: 5  Faces Pain Scale: Hurts little more Pain Location: back Pain Descriptors / Indicators: Burning;Sore Pain Intervention(s): Monitored during session;Repositioned;Premedicated before session    [place] expects to be discharged to:: Private residence Living Arrangements: Spouse/significant other;Other relatives Available Help at Discharge: Family Type of Home: House Home Access: Stairs to enter   Home Layout: One level Home Equipment: Environmental consultant - standard;Crutches      Prior Function Level of Independence: Needs assistance    ADL's / Homemaking Assistance Needed: needed intermittent assist for LB ADLs from spouse     PT Goals (current goals can now be found in the care plan section) Acute Rehab PT Goals Patient Stated Goal: Resume previous lifestyle with decreased pain Progress towards PT goals: Progressing toward goals    Frequency    7X/week      PT Plan Current plan remains appropriate    Co-evaluation              AM-PAC PT "6 Clicks" Daily Activity  Outcome Measure  Difficulty turning over in bed (including adjusting bedclothes, sheets and blankets)?: A Little Difficulty moving from lying on back to sitting on the side of the bed? : A Lot Difficulty sitting down on and standing up from a chair with arms (e.g., wheelchair, bedside commode, etc,.)?: A Little Help needed moving to and from a bed to chair (including a wheelchair)?: A Little Help needed walking in hospital room?: A Little Help needed climbing 3-5 steps with a railing? : A  Little 6 Click Score: 17    End of Session Equipment Utilized During Treatment: Gait belt Activity Tolerance: Patient tolerated treatment well Patient left: in chair;with call bell/phone within reach;with family/visitor present Nurse Communication:  (pt ready for D/C to home) PT Visit Diagnosis: Difficulty in walking, not elsewhere classified (R26.2)     Time: 9937-1696 PT Time Calculation (min) (ACUTE ONLY): 20 min  Charges:  $Gait Training: 8-22 mins                    G Codes:       Rica Koyanagi  PTA [facility]  Acute  Rehab Pager      [phone]

## 2016-11-04 NOTE — Evaluation (Signed)
Occupational Therapy Evaluation Patient Details Name: John Norton MRN: [id number] DOB: [birth year] Today's Date: 11/04/2016    History of Present Illness Pt s/p L4-5, L5-S1 micro-lumbar decompression and with hx of LBBB and Parkinsons   Clinical Impression   This 61 y/o M presents with the above. At baseline Pt received intermittent assist from spouse for LB ADLs. Pt currently requires MinA-MinGuard for functional mobility without AD, MaxA for LB ADLs due to adhering to back precautions. Education and verbal cues provided throughout session for adherence to back precautions during ADL completion. Pt will return home with spouse who is able to assist with ADL completion PRN. Questions answered throughout. No further OT needs identified at this time. Will sign off.     Follow Up Recommendations  No OT follow up;Supervision/Assistance - 24 hour    Equipment Recommendations  3 in 1 bedside commode           Precautions / Restrictions Precautions Precautions: Back;Fall Precaution Booklet Issued: Yes (comment) Precaution Comments: Back precautions reviewed throughout session  Restrictions Weight Bearing Restrictions: No      Mobility Bed Mobility Overal bed mobility: Needs Assistance Bed Mobility: Rolling;Sidelying to Sit Rolling: Min guard Sidelying to sit: Min guard       General bed mobility comments: cues for adherence to back precautions and correct log roll technique  Transfers Overall transfer level: Needs assistance Equipment used: None Transfers: Sit to/from Stand Sit to Stand: Min guard         General transfer comment: cues for transition position and hand placement; Pt demonstrates good carryover of back precautions during sit<>stand                                               ADL either performed or assessed with clinical judgement   ADL Overall ADL's : Needs assistance/impaired Eating/Feeding: Set up;Sitting   Grooming: Min  guard;Oral care;Cueing for safety;Cueing for compensatory techniques   Upper Body Bathing: Min guard;Sitting   Lower Body Bathing: Moderate assistance;Sit to/from stand;Cueing for back precautions   Upper Body Dressing : Min guard;Sitting   Lower Body Dressing: Maximal assistance;Sit to/from stand;Cueing for back precautions   Toilet Transfer: Min guard;Ambulation;BSC Toilet Transfer Details (indicate cue type and reason): BSC over toilet  Toileting- Clothing Manipulation and Hygiene: Minimal assistance;Sit to/from Nurse, children's Details (indicate cue type and reason): educated on safe transfer technique and use of 3:1 in shower for increased safety during task completion  Functional mobility during ADLs: Minimal assistance General ADL Comments: educated on back precautions, DME, and compensatory techniques for completing ADLs while adhering to back precautions                          Pertinent Vitals/Pain Pain Assessment: Faces Faces Pain Scale: Hurts little more Pain Location: back Pain Descriptors / Indicators: Burning;Sore Pain Intervention(s): Limited activity within patient's tolerance;Repositioned;Monitored during session          Extremity/Trunk Assessment Upper Extremity Assessment Upper Extremity Assessment: Overall WFL for tasks assessed   Lower Extremity Assessment Lower Extremity Assessment: Defer to PT evaluation   Cervical / Trunk Assessment Cervical / Trunk Assessment: Normal   Communication Communication Communication: No difficulties   Cognition Arousal/Alertness: Awake/alert Behavior During Therapy: WFL for tasks assessed/performed Overall Cognitive Status: Within Functional Limits for tasks  assessed                                 General Comments: easily distracted, requires redirection throughout                    [place] expects to be discharged to:: Private residence Living  Arrangements: Spouse/significant other;Other relatives Available Help at Discharge: Family Type of Home: House Home Access: Stairs to enter [employer] of Steps: 2 vs 7    Home Layout: One level     Bathroom Shower/Tub: Tub/shower unit;Walk-in shower   Bathroom Toilet: Standard     Home Equipment: Environmental consultant - standard;Crutches          Prior Functioning/Environment Level of Independence: Needs assistance    ADL's / Homemaking Assistance Needed: needed intermittent assist for LB ADLs from spouse            OT Problem List: Decreased strength;Decreased activity tolerance;Decreased knowledge of use of DME or AE;Decreased knowledge of precautions            OT Goals(Current goals can be found in the care plan section) Acute Rehab OT Goals Patient Stated Goal: Resume previous lifestyle with decreased pain OT Goal Formulation: With patient                                 AM-PAC PT "6 Clicks" Daily Activity     Outcome Measure Help from another person eating meals?: None Help from another person taking care of personal grooming?: A Little Help from another person toileting, which includes using toliet, bedpan, or urinal?: A Little Help from another person bathing (including washing, rinsing, drying)?: A Lot Help from another person to put on and taking off regular upper body clothing?: A Little Help from another person to put on and taking off regular lower body clothing?: A Lot 6 Click Score: 17   End of Session Equipment Utilized During Treatment: Gait belt Nurse Communication: Mobility status  Activity Tolerance: Patient tolerated treatment well Patient left: in chair;with call bell/phone within reach;with family/visitor present;Other (comment) (PT arriving to begin session )  OT Visit Diagnosis: Muscle weakness (generalized) (M62.81)                Time: 1540-0867 OT Time Calculation (min): 37 min Charges:  OT General Charges $OT Visit: 1  Visit OT Evaluation $OT Eval Low Complexity: 1 Low OT Treatments $Self Care/Home Management : 8-22 mins G-Codes: OT G-codes **NOT FOR INPATIENT CLASS** Functional Assessment Tool Used: AM-PAC 6 Clicks Daily Activity;Clinical judgement Functional Limitation: Self care Self Care Current Status (Y1950): At least 20 percent but less than 40 percent impaired, limited or restricted Self Care Goal Status (D3267): At least 20 percent but less than 40 percent impaired, limited or restricted Self Care Discharge Status [id number]): At least 20 percent but less than 40 percent impaired, limited or restricted   Lou Cal, OT Pager [phone] 11/04/2016   Raymondo Band 11/04/2016, 9:46 AM

## 2016-11-04 NOTE — Op Note (Signed)
John Norton, John Norton NO.:  [id number]  MEDICAL RECORD NO.:  [id number]  LOCATION:  [facility]                         FACILITY:  [facility]  PHYSICIAN:  Susa Day, M.D.    DATE OF BIRTH:  [birth year]  DATE OF PROCEDURE:  11/03/2016 DATE OF DISCHARGE:                              OPERATIVE REPORT   PREOPERATIVE DIAGNOSIS:  Spinal stenosis, L4-5, L5-S1.  POSTOPERATIVE DIAGNOSIS:  Spinal stenosis, L4-5, L5-S1.  PROCEDURES PERFORMED: 1. Microlumbar decompression, L4-5, L5-S1. 2. Laminectomy of L5. 3. Bilateral lateral recess decompression at L4-5 and at L5-S1. 4. Foraminotomies L4, L5, S1.  ANESTHESIA:  General.  ASSISTANT:  Lacie Draft, PA.  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  He has an end-stage disk degeneration and a mild scoliosis.  He had minimal back pain and predominantly right lower extremity radicular pain.  It was felt that the lateral recess, particularly at L4-5, was impinging upon predominantly the L5 nerve root.  Though we had bilateral symptomatology, we discussed a decompression at L4-5 and then possibly at L5-S1, particularly on the right.  Risks and benefits discussed including bleeding, infection, damage to the neurovascular structures, no change in symptoms, worsening symptoms, DVT, PE, anesthetic complications, need for fusion in the future.  TECHNIQUE:  With the patient in supine position after induction of adequate general anesthesia, 2 g Kefzol, placed prone on the [place] frame.  All bony prominences were well padded.  Lumbar region was prepped and draped in usual sterile fashion.  Two 18-gauge spinal needle was utilized to localize L4-5, L5-S1 interspace, confirmed by x-ray. Incision was made from spinous process to L4 to below S1.  Subcutaneous tissue was dissected.  Electrocautery was utilized to achieve hemostasis.  Marcaine  0.25 with epinephrine was infiltrated in the perimuscular tissue.  Paraspinous muscles elevated from the lamina of L4 and L5.  McCullough retractor was placed.  Operating microscope was draped and brought on the surgical field.  Confirmatory radiograph obtained, identifying the spinous processes of L4 and L5.  A Leksell rongeur was utilized to remove the spinous process of L5. There was no interlaminar space on the left at L4-5 due to the junctional scoliosis and facet hypertrophy.  I therefore initiated the decompression at L5-S1.  A hemilaminotomy was performed on the caudad edge of L5 with a 2 mm Kerrison.  Detached ligamentum flavum from the cephalad edge of S1 with a micro curette.  Patty placed beneath the ligamentum.  The ligamentum flavum which was hypertrophied was removed from the interspace at L5-S1.  We decompressed the lateral recess at L5- S1 to the medial border of the pedicle.  There was stenosis on the right greater than the left.  I continued cephalad.  Removed the neural arch of L5 and encountered ligamentum flavum hypertrophy and facet hypertrophy at L4-5 bilaterally.  I decompressed the lateral recess to medial border of the pedicle bilaterally.  This was a 3 mm and 2 mm Kerrison.  Ligamentum flavum removed from the interspace and performed hemilaminotomy of the caudad edge of L4 bilaterally as well.  Partial removal of spinous process of L4.  Foraminotomies were performed at L4, L5, and S1.  There was no disk herniation.  Following this, a neural probe passed freely at the foramen of L4-5 and S1 bilaterally and up above the pedicle of L4.  Good restoration of thecal sac.  No active bleeding.  No CSF leakage.  Confirmatory radiograph obtained.  It was felt that we encountered pathology consistent with his symptomatology. No further pathology amenable to decompression at this point.  I therefore irrigated copiously and placed thrombin-soaked Gelfoam in the laminotomy  defect.  We removed the [facility] retractor, irrigated the paraspinous musculature, closed the dorsolumbar fascia with 1 Vicryl, subcu with 2, and skin with staples.  Wound was dressed sterilely. Placed supine on the hospital bed, extubated without difficulty, and transported to the recovery room in satisfactory condition.  The patient tolerated the procedure well.  No complications.  Minimal blood loss.     Susa Day, M.D.     Geralynn Rile  D:  11/03/2016  T:  11/04/2016  Job:  [id number]

## 2016-11-04 NOTE — Care Management Obs Status (Signed)
[place] NOTIFICATION   Patient Details  Name: John Norton MRN: [id number] Date of Birth: 1955/07/12   Medicare Observation Status Notification Given:  Yes    Dessa Phi, RN 11/04/2016, 1:53 PM

## 2016-11-04 NOTE — Care Management Note (Signed)
Case Management Note  Patient Details  Name: John Norton MRN: [id number] Date of Birth: 1955-08-20  Subjective/Objective: 61 y/o m admitted w/spinal stenosis. From home. No PT f/u. Needs 3n1-[facility] dme rep Jermaine aware of 3n1 order, & d/c, to deliver 3n1 to rm prior d/c.No further CM needs.                   Action/Plan:d/c home w/dme.   Expected Discharge Date:  11/04/16               Expected Discharge Plan:  Home/Self Care  In-House Referral:     Discharge planning Services  CM Consult  Post Acute Care Choice:    Choice offered to:  Patient  DME Arranged:  3-N-1 DME Agency:  Learned:    [facility] Agency:     Status of Service:  Completed, signed off  If discussed at [place] of Stay Meetings, dates discussed:    Additional Comments:  Dessa Phi, RN 11/04/2016, 1:51 PM

## 2016-11-04 NOTE — Progress Notes (Signed)
Subjective: 1 Day Post-Op Procedure(s) (LRB): Microlumbar Decompression L4-5, L5-S1 (N/A) Patient reports pain as moderate.  Reports back pain. No leg pain currently but has not been OOB much yet. Foley removed this AM has not voided yet. NO other c/o.  Objective: Vital signs in last 24 hours: Temp:  [97.2 F (36.2 C)-98.1 F (36.7 C)] 97.5 F (36.4 C) (09/07 0619) Pulse Rate:  [63-90] 90 (09/07 0619) Resp:  [8-18] 16 (09/07 0619) BP: (124-156)/(68-84) 156/84 (09/07 0619) SpO2:  [98 %-100 %] 100 % (09/07 0619)  Intake/Output from previous day: 09/06 0701 - 09/07 0700 In: 2370 [P.O.:720; I.V.:1550; IV Piggyback:100] Out: 2800 [Urine:2700; Blood:100] Intake/Output this shift: No intake/output data recorded.  No results for input(s): HGB in the last 72 hours. No results for input(s): WBC, RBC, HCT, PLT in the last 72 hours.  Recent Labs  11/04/16 0434  NA 138  K 4.2  CL 104  CO2 25  BUN 15  CREATININE 1.12  GLUCOSE 187*  CALCIUM 9.1   No results for input(s): LABPT, INR in the last 72 hours.  Neurologically intact ABD soft Neurovascular intact Sensation intact distally Intact pulses distally Dorsiflexion/Plantar flexion intact Incision: dressing C/D/I and no drainage No cellulitis present Compartment soft no calf pain or sign of DVT  Assessment/Plan: 1 Day Post-Op Procedure(s) (LRB): Microlumbar Decompression L4-5, L5-S1 (N/A) Advance diet Up with therapy D/C IV fluids  Voiding trial. Bladder scan and straight cath if unable to void 6 hrs from foley removal Discussed D/C instructions, dressing instructions and Lspine precautions Plan D/C later today as long as pain remains well controlled otherwise hold until tomorrow Will discuss with Dr Mliss Fritz, [place] Rolls. 11/04/2016, 8:10 AM

## 2016-11-25 DIAGNOSIS — M48062 Spinal stenosis, lumbar region with neurogenic claudication: Secondary | ICD-10-CM | POA: Diagnosis not present

## 2016-12-07 ENCOUNTER — Encounter: Payer: Self-pay | Admitting: Cardiology

## 2016-12-07 ENCOUNTER — Ambulatory Visit (INDEPENDENT_AMBULATORY_CARE_PROVIDER_SITE_OTHER): Payer: Medicare Other | Admitting: Cardiology

## 2016-12-07 VITALS — BP 138/78 | HR 66 | Ht 72.0 in | Wt 216.0 lb

## 2016-12-07 DIAGNOSIS — G20 Parkinson's disease: Secondary | ICD-10-CM

## 2016-12-07 DIAGNOSIS — I447 Left bundle-branch block, unspecified: Secondary | ICD-10-CM | POA: Diagnosis not present

## 2016-12-07 DIAGNOSIS — M48062 Spinal stenosis, lumbar region with neurogenic claudication: Secondary | ICD-10-CM | POA: Diagnosis not present

## 2016-12-07 DIAGNOSIS — I480 Paroxysmal atrial fibrillation: Secondary | ICD-10-CM | POA: Diagnosis not present

## 2016-12-07 MED ORDER — FUROSEMIDE 20 MG PO TABS: 20.0000 mg | ORAL_TABLET | Freq: Every day | ORAL | 3 refills | Status: DC

## 2016-12-07 NOTE — Progress Notes (Signed)
Cardiology Office Note:    Date:  12/07/2016   ID:  [name], DOB 09-16-1955, MRN [id number]  PCP:  Claretta Fraise, MD  Cardiologist:  Jenean Lindau, MD   Referring MD: Claretta Fraise, MD    ASSESSMENT:    1. Paroxysmal atrial fibrillation (HCC)   2. LBBB (left bundle branch block)   3. Parkinson's disease ([place])    PLAN:    In order of problems listed above:  1. I discussed my findings with the patient. Patient has mild bilateral pedal edema I evaluated him and prescribed furosemide 20 mg  a day regimen. It will help his blood pressure too. Potassium supplementation and diet was advised. I told him to keep his feet elevated whenever he is sitting down or sleeping. Compression stockings were also increased. He will be back in 2 weeks for pulse blood pressure and a Chem-7. He will be seen in follow-up appointment in 6 months or earlier if he has any concerns.   Medication Adjustments/Labs and Tests Ordered: Current medicines are reviewed at length with the patient today.  Concerns regarding medicines are outlined above.  Orders Placed This Encounter  Procedures  . Basic Metabolic Panel (BMET)   Meds ordered this encounter  Medications  . furosemide (LASIX) 20 MG tablet    Sig: Take 1 tablet (20 mg total) by mouth daily.    Dispense:  90 tablet    Refill:  3     Chief Complaint  Patient presents with  . Follow-up    Back Surgery 9/05   . Edema    have some swelling in both feet more in left.      History of Present Illness:    John Norton is a 61 y.o. male . He was evaluated by me for preop assessment. He is accompanied by his wife. He denies any problems at this time. No chest pain orthopnea or PND. At the time of my evaluation is alert awake oriented and in no distress. He mentions to me that he has mild bilateral pedal edema and it doesn't bother him much.  Past Medical History:  Diagnosis Date  . ED (erectile dysfunction)   . History of kidney  stones YRS AGO  . LBBB (left bundle branch block)   . Neuropathy    FOREHEAD BURNING AND NUMBNESS  . Parkinson's disease   . Plantar fasciitis 2017   IMPROVED  . Sleep apnea    does not use cpap, NO SLEEP APNEA SINCE QUIT SMOKING 2012  . Spinal stenosis   . Tremors of nervous system     Past Surgical History:  Procedure Laterality Date  . "spot on lung"  SEVERAL YRS AGO   SPOT [place]  . CHEST X-RAY  08/21/10   No cardiopulmonary process noted.  . CHOLECYSTECTOMY N/A 04/20/2012   Procedure: LAPAROSCOPIC CHOLECYSTECTOMY;  Surgeon: Jamesetta So, MD;  Location: [facility];  Service: General;  Laterality: N/A;  . [place]  . LEXISCAN MYOVIEW  08/22/10    There is no stress-induced ischemia,  but a fixed deficit in the lateral apex and inferior wall.  Ejection fraction 51%  . LUMBAR LAMINECTOMY/DECOMPRESSION MICRODISCECTOMY N/A 11/03/2016   Procedure: Microlumbar Decompression L4-5, L5-S1;  Surgeon: Susa Day, MD;  Location: [facility];  Service: Orthopedics;  Laterality: N/A;  120 mins  . THYROID LOBECTOMY     RIGHT THYROID    Current Medications: Current Meds  Medication Sig  . amantadine (SYMMETREL)  100 MG capsule Take 100 mg by mouth 3 (three) times daily.  Marland Kitchen aspirin 81 MG EC tablet Take 1 tablet (81 mg total) by mouth daily. Resume 4 days post-op  . carbidopa-levodopa (SINEMET) 25-100 MG per tablet Take 1.5 tablets by mouth 4 (four) times daily.   Marland Kitchen docusate sodium (COLACE) 100 MG capsule Take 1 capsule (100 mg total) by mouth 2 (two) times daily as needed for mild constipation.  Marland Kitchen ibuprofen (ADVIL,MOTRIN) 200 MG tablet Take 3 tablets (600 mg total) by mouth every 8 (eight) hours as needed for headache or mild pain. Resume 5 days post-op as needed  . oxyCODONE-acetaminophen (PERCOCET) 5-325 MG tablet Take 1 tablet by mouth every 4 (four) hours as needed for severe pain.  . polyethylene glycol (MIRALAX / GLYCOLAX) packet Take 17 g by mouth daily.  . pregabalin  (LYRICA) 200 MG capsule Take 1 capsule (200 mg total) by mouth 2 (two) times daily.  . propranolol ER (INDERAL LA) 60 MG 24 hr capsule Take 60 mg by mouth daily.  . selegiline (ELDEPRYL) 5 MG capsule Take 1 capsule by mouth 2 (two) times daily.  . sildenafil (VIAGRA) 100 MG tablet Take 100 mg by mouth daily as needed for erectile dysfunction.      Allergies:   Codeine   Social History   Social History  . Marital status: Married    Spouse name: N/A  . Number of children: 1  . Years of education: 36   Occupational History  . disability    Social History Main Topics  . Smoking status: Former Smoker    Packs/day: 1.50    Years: 40.00    Types: Cigarettes    Quit date: 07/30/2010  . Smokeless tobacco: Current User    Types: Chew  . Alcohol use No  . Drug use: No  . Sexual activity: Not Asked   Other Topics Concern  . None   Social History Narrative   Lives with wife in a one story home.  Has one daughter.  On disability.  Education: 12th     Family History: The patient's family history includes Colon polyps in his brother. There is no history of Colon cancer, Rectal cancer, Stomach cancer, or Esophageal cancer.  ROS:   Please see the history of present illness.    All other systems reviewed and are negative.  EKGs/Labs/Other Studies Reviewed:    The following studies were reviewed today: I reviewed my findings with the patient at extensive length reports from previous tests done were also discussed.   Recent Labs: 12/29/2015: ALT 9 10/17/2016: Hemoglobin 15.7; Platelets 232; TSH 1.450 11/04/2016: BUN 15; Creatinine, Ser 1.12; Potassium 4.2; Sodium 138  Recent Lipid Panel    Component Value Date/Time   CHOL 164 12/29/2015 1720   TRIG 226 (H) 12/29/2015 1720   HDL 27 (L) 12/29/2015 1720   CHOLHDL 6.1 (H) 12/29/2015 1720   CHOLHDL 6.7 08/22/2010 0650   VLDL 49 (H) 08/22/2010 0650   LDLCALC 92 12/29/2015 1720    Physical Exam:    VS:  BP 138/78   Pulse 66   Ht  6' (1.829 m)   Wt 216 lb (98 kg)   SpO2 96%   BMI 29.29 kg/m     Wt Readings from Last 3 Encounters:  12/07/16 216 lb (98 kg)  11/03/16 210 lb (95.3 kg)  10/28/16 210 lb (95.3 kg)     GEN: Patient is in no acute distress HEENT: Normal NECK: No JVD; No  carotid bruits LYMPHATICS: No lymphadenopathy CARDIAC: Hear sounds regular, 2/6 systolic murmur at the apex. RESPIRATORY:  Clear to auscultation without rales, wheezing or rhonchi  ABDOMEN: Soft, non-tender, non-distended MUSCULOSKELETAL:  No edema; No deformity  SKIN: Warm and dry NEUROLOGIC:  Alert and oriented x 3 PSYCHIATRIC:  Normal affect   Signed, Jenean Lindau, MD  12/07/2016 11:18 AM    [place]

## 2016-12-07 NOTE — Patient Instructions (Addendum)
Medication Instructions:  Your physician has recommended you make the following change in your medication:  START Furosemide 20mg  daily   Labwork: Come back in 2 weeks for BMP   Testing/Procedures: None  Follow-Up: 6 months with Dr. Geraldo Pitter 2 week follow up with nurse for blood pressure and pulse check  Any Other Special Instructions Will Be Listed Below (If Applicable).     If you need a refill on your cardiac medications before your next appointment, please call your pharmacy.

## 2016-12-20 DIAGNOSIS — G20 Parkinson's disease: Secondary | ICD-10-CM | POA: Diagnosis not present

## 2016-12-20 DIAGNOSIS — R251 Tremor, unspecified: Secondary | ICD-10-CM | POA: Diagnosis not present

## 2016-12-21 ENCOUNTER — Ambulatory Visit (INDEPENDENT_AMBULATORY_CARE_PROVIDER_SITE_OTHER): Payer: Medicare Other | Admitting: Cardiology

## 2016-12-21 VITALS — BP 120/80 | HR 56

## 2016-12-21 DIAGNOSIS — I10 Essential (primary) hypertension: Secondary | ICD-10-CM | POA: Diagnosis not present

## 2016-12-21 DIAGNOSIS — I480 Paroxysmal atrial fibrillation: Secondary | ICD-10-CM | POA: Diagnosis not present

## 2016-12-21 NOTE — Progress Notes (Signed)
BP and Pulse check. 2+ pitting edema to bilateral lower extremities. Patient states that he has not seen a significant improvement to his lower extremity swelling. Patient also states that he has not had any significant weight gain that he can recall. Patient was informed that he should have his legs elevated while sitting, wearing compression stockings that fit properly, walking frequently, and weighing himself daily. Patient was educated as to when to call regarding weight gain. Patient's BP was taken in the left arm while sitting 120/80 and pulse was 56. Per Dr. Bettina Gavia continue with lasix. BMP was drawn today. John Norton

## 2016-12-21 NOTE — Patient Instructions (Addendum)
Medication Instructions:  Your physician recommends that you continue on your current medications as directed. Please refer to the Current Medication list given to you today.   Labwork: BMP done today  Testing/Procedures: BP and pulse were taken today  Follow-Up: Your physician recommends that you schedule a follow-up appointment in: 6 months  Any Other Special Instructions Will Be Listed Below (If Applicable).  Weigh yourself everyday Wear compression stockings daily and ensure that they fit you properly Walking every day Elevate your feet when sitting   If you need a refill on your cardiac medications before your next appointment, please call your pharmacy.   [place], RN, BSN  Daily Weight Record It is important to weigh yourself daily. Keep this daily weight chart near your scale. Weigh yourself each morning at the same time. Weigh yourself without shoes, and wear the same amount of clothing each day. Compare today's weight to yesterday's weight. Bring this form with you to your follow-up appointments.  Call your health care provider if you have concerns about your weight, including rapid weight gain or rapid weight loss. 3-5 pounds in a 24 hour time span.  Elsevier Interactive Patient Education  2017 [place].   Date: ________ Weight: ____________________ Date: ________ Weight: ____________________ Date: ________ Weight: ____________________ Date: ________ Weight: ____________________ Date: ________ Weight: ____________________ Date: ________ Weight: ____________________ Date: ________ Weight: ____________________ Date: ________ Weight: ____________________ Date: ________ Weight: ____________________ Date: ________ Weight: ____________________ Date: ________ Weight: ____________________ Date: ________ Weight: ____________________ Date: ________ Weight: ____________________ Date: ________ Weight: ____________________ Date: ________ Weight:  ____________________ Date: ________ Weight: ____________________ Date: ________ Weight: ____________________ Date: ________ Weight: ____________________ Date: ________ Weight: ____________________ Date: ________ Weight: ____________________ Date: ________ Weight: ____________________ Date: ________ Weight: ____________________ Date: ________ Weight: ____________________ Date: ________ Weight: ____________________ Date: ________ Weight: ____________________ Date: ________ Weight: ____________________ Date: ________ Weight: ____________________ Date: ________ Weight: ____________________ Date: ________ Weight: ____________________ Date: ________ Weight: ____________________ Date: ________ Weight: ____________________ Date: ________ Weight: ____________________ Date: ________ Weight: ____________________ Date: ________ Weight: ____________________ Date: ________ Weight: ____________________ Date: ________ Weight: ____________________ Date: ________ Weight: ____________________ Date: ________ Weight: ____________________ Date: ________ Weight: ____________________ Date: ________ Weight: ____________________ Date: ________ Weight: ____________________ Date: ________ Weight: ____________________ Date: ________ Weight: ____________________ Date: ________ Weight: ____________________ Date: ________ Weight: ____________________ Date: ________ Weight: ____________________ Date: ________ Weight: ____________________ Date: ________ Weight: ____________________ Date: ________ Weight: ____________________ Date: ________ Weight: ____________________ This information is not intended to replace advice given to you by your health care provider. Make sure you discuss any questions you have with your health care provider. Document Released: 04/28/2006 Document Revised: 01/29/2016 Document Reviewed: 09/13/2013 Elsevier Interactive Patient Education  [employer].

## 2016-12-22 LAB — BASIC METABOLIC PANEL
BUN / CREAT RATIO: 15 (ref 10–24)
BUN: 21 mg/dL (ref 8–27)
CHLORIDE: 102 mmol/L (ref 96–106)
CO2: 24 mmol/L (ref 20–29)
CREATININE: 1.39 mg/dL — AB (ref 0.76–1.27)
Calcium: 9.8 mg/dL (ref 8.6–10.2)
GFR calc Af Amer: 63 mL/min/{1.73_m2} (ref >59)
GFR calc non Af Amer: 55 mL/min/{1.73_m2} — ABNORMAL LOW (ref >59)
GLUCOSE: 124 mg/dL — AB (ref 65–99)
POTASSIUM: 4 mmol/L (ref 3.5–5.2)
SODIUM: 145 mmol/L — AB (ref 134–144)

## 2017-01-26 DIAGNOSIS — M48062 Spinal stenosis, lumbar region with neurogenic claudication: Secondary | ICD-10-CM | POA: Diagnosis not present

## 2017-02-12 ENCOUNTER — Other Ambulatory Visit: Payer: Self-pay | Admitting: Family Medicine

## 2017-02-28 DIAGNOSIS — I4891 Unspecified atrial fibrillation: Secondary | ICD-10-CM

## 2017-02-28 HISTORY — DX: Unspecified atrial fibrillation: I48.91

## 2017-04-26 DIAGNOSIS — M5417 Radiculopathy, lumbosacral region: Secondary | ICD-10-CM | POA: Diagnosis not present

## 2017-04-26 DIAGNOSIS — M5136 Other intervertebral disc degeneration, lumbar region: Secondary | ICD-10-CM | POA: Diagnosis not present

## 2017-04-26 DIAGNOSIS — M545 Low back pain: Secondary | ICD-10-CM | POA: Diagnosis not present

## 2017-06-08 ENCOUNTER — Ambulatory Visit: Payer: Medicare Other | Admitting: Cardiology

## 2017-06-08 ENCOUNTER — Encounter: Payer: Self-pay | Admitting: Cardiology

## 2017-06-08 VITALS — BP 124/70 | HR 60 | Ht 72.0 in | Wt 209.4 lb

## 2017-06-08 DIAGNOSIS — I447 Left bundle-branch block, unspecified: Secondary | ICD-10-CM | POA: Diagnosis not present

## 2017-06-08 DIAGNOSIS — G20 Parkinson's disease: Secondary | ICD-10-CM | POA: Diagnosis not present

## 2017-06-08 DIAGNOSIS — I480 Paroxysmal atrial fibrillation: Secondary | ICD-10-CM | POA: Diagnosis not present

## 2017-06-08 NOTE — Patient Instructions (Signed)
Medication Instructions:  Your physician recommends that you continue on your current medications as directed. Please refer to the Current Medication list given to you today.  Labwork: Your physician recommends that you have the following labs drawn: BMP today  Testing/Procedures: None  Follow-Up: Your physician recommends that you schedule a follow-up appointment in: 6 months  Any Other Special Instructions Will Be Listed Below (If Applicable).     If you need a refill on your cardiac medications before your next appointment, please call your pharmacy.   [facility]  Ashley A, RN, BSN  

## 2017-06-08 NOTE — Progress Notes (Signed)
Cardiology Office Note:    Date:  06/08/2017   ID:  John Norton, DOB 03-04-1955, MRN [id number]  PCP:  Claretta Fraise, MD  Cardiologist:  Jenean Lindau, MD   Referring MD: Claretta Fraise, MD    ASSESSMENT:    1. Paroxysmal atrial fibrillation (HCC)   2. LBBB (left bundle branch block)   3. Parkinson's disease ([place])   4. PAF (paroxysmal atrial fibrillation) (HCC)    PLAN:    In order of problems listed above:  1. Secondary prevention stressed with the patient.  Importance of compliance with diet and medications stressed and he vocalized understanding.  Is very meticulous with his exercise and medications. 2. I reviewed his records and he had mild renal insufficiency with the last time.  Blood work checked so we are going to repeat a Chem-7.I discussed with the patient atrial fibrillation, disease process. Management and therapy including rate and rhythm control, anticoagulation benefits and potential risks were discussed extensively with the patient. Patient had multiple questions which were answered to patient's satisfaction.The patient is on full-strength coated aspirin. 3.   Patient will be seen in follow-up appointment in 6 months or earlier if       the patient has any concerns    Medication Adjustments/Labs and Tests Ordered: Current medicines are reviewed at length with the patient today.  Concerns regarding medicines are outlined above.  Orders Placed This Encounter  Procedures  . Basic metabolic panel   No orders of the defined types were placed in this encounter.    Chief Complaint  Patient presents with  . Follow-up  . Atrial Fibrillation     History of Present Illness:    John Norton is a 62 y.o. male.  Patient has Parkinson's disease and paroxysmal atrial fibrillation.  He denies any problems at this time and takes care of activities of daily living.  No chest pain orthopnea or PND.  He has gait instability but is an otherwise active gentleman.  He  denies any history of palpitations and he is been feeling well over the past 6 months that have seen him.  Past Medical History:  Diagnosis Date  . ED (erectile dysfunction)   . History of kidney stones YRS AGO  . LBBB (left bundle branch block)   . Neuropathy    FOREHEAD BURNING AND NUMBNESS  . Parkinson's disease   . Plantar fasciitis 2017   IMPROVED  . Sleep apnea    does not use cpap, NO SLEEP APNEA SINCE QUIT SMOKING 2012  . Spinal stenosis   . Tremors of nervous system     Past Surgical History:  Procedure Laterality Date  . "spot on lung"  SEVERAL YRS AGO   SPOT [place]  . CHEST X-RAY  08/21/10   No cardiopulmonary process noted.  . CHOLECYSTECTOMY N/A 04/20/2012   Procedure: LAPAROSCOPIC CHOLECYSTECTOMY;  Surgeon: Jamesetta So, MD;  Location: [facility];  Service: General;  Laterality: N/A;  . [place]  . LEXISCAN MYOVIEW  08/22/10    There is no stress-induced ischemia,  but a fixed deficit in the lateral apex and inferior wall.  Ejection fraction 51%  . LUMBAR LAMINECTOMY/DECOMPRESSION MICRODISCECTOMY N/A 11/03/2016   Procedure: Microlumbar Decompression L4-5, L5-S1;  Surgeon: Susa Day, MD;  Location: [facility];  Service: Orthopedics;  Laterality: N/A;  120 mins  . THYROID LOBECTOMY     RIGHT THYROID    Current Medications: Current Meds  Medication Sig  .  amantadine (SYMMETREL) 100 MG capsule Take 100 mg by mouth 3 (three) times daily.  Marland Kitchen aspirin EC 325 MG tablet Take 325 mg by mouth daily.  . carbidopa-levodopa (SINEMET) 25-100 MG per tablet Take 1.5 tablets by mouth 4 (four) times daily.   . DOCOSAHEXAENOIC ACID PO Take by mouth.  Marland Kitchen ibuprofen (ADVIL,MOTRIN) 200 MG tablet Take 3 tablets (600 mg total) by mouth every 8 (eight) hours as needed for headache or mild pain. Resume 5 days post-op as needed  . Multiple Vitamin (MULTI-VITAMINS) TABS Take by mouth.  . oxyCODONE-acetaminophen (PERCOCET) 5-325 MG tablet Take 1 tablet by mouth every 4  (four) hours as needed for severe pain. (Patient taking differently: Take 1 tablet by mouth as needed for severe pain. )  . polyethylene glycol (MIRALAX / GLYCOLAX) packet Take 17 g by mouth daily.  . predniSONE (DELTASONE) 5 MG tablet prednisone 5 mg tablet  Take 1 tablet every day by oral route as needed.  . pregabalin (LYRICA) 200 MG capsule Take 1 capsule (200 mg total) by mouth 2 (two) times daily.  . propranolol ER (INDERAL LA) 60 MG 24 hr capsule Take 60 mg by mouth daily.  . selegiline (ELDEPRYL) 5 MG capsule Take 1 capsule by mouth 2 (two) times daily.  . sildenafil (VIAGRA) 100 MG tablet Take 100 mg by mouth daily as needed for erectile dysfunction.   . [DISCONTINUED] aspirin 81 MG EC tablet Take 1 tablet (81 mg total) by mouth daily. Resume 4 days post-op (Patient taking differently: Take 325 mg by mouth daily. Resume 4 days post-op)  . [DISCONTINUED] docusate sodium (COLACE) 100 MG capsule Take 1 capsule (100 mg total) by mouth 2 (two) times daily as needed for mild constipation.     Allergies:   Codeine   Social History   Socioeconomic History  . Marital status: Married    Spouse name: Not on file  . Number of children: 1  . Years of education: 46  . Highest education level: Not on file  Occupational History  . Occupation: disability  Social Needs  . Financial resource strain: Not on file  . Food insecurity:    Worry: Not on file    Inability: Not on file  . Transportation needs:    Medical: Not on file    Non-medical: Not on file  Tobacco Use  . Smoking status: Former Smoker    Packs/day: 1.50    Years: 40.00    Pack years: 60.00    Types: Cigarettes    Last attempt to quit: 07/30/2010    Years since quitting: 6.8  . Smokeless tobacco: Current User    Types: Chew  Substance and Sexual Activity  . Alcohol use: No  . Drug use: No  . Sexual activity: Not on file  Lifestyle  . Physical activity:    Days per week: Not on file    Minutes per session: Not on file   . Stress: Not on file  Relationships  . Social connections:    Talks on phone: Not on file    Gets together: Not on file    Attends religious service: Not on file    Active member of club or organization: Not on file    Attends meetings of clubs or organizations: Not on file    Relationship status: Not on file  Other Topics Concern  . Not on file  Social History Narrative   Lives with wife in a one story home.  Has one daughter.  On disability.  Education: 12th     Family History: The patient's family history includes Colon polyps in his brother. There is no history of Colon cancer, Rectal cancer, Stomach cancer, or Esophageal cancer.  ROS:   Please see the history of present illness.    All other systems reviewed and are negative.  EKGs/Labs/Other Studies Reviewed:    The following studies were reviewed today: I discussed my findings with the patient extensively.   Recent Labs: 10/17/2016: Hemoglobin 15.7; Platelets 232; TSH 1.450 12/21/2016: BUN 21; Creatinine, Ser 1.39; Potassium 4.0; Sodium 145  Recent Lipid Panel    Component Value Date/Time   CHOL 164 12/29/2015 1720   TRIG 226 (H) 12/29/2015 1720   HDL 27 (L) 12/29/2015 1720   CHOLHDL 6.1 (H) 12/29/2015 1720   CHOLHDL 6.7 08/22/2010 0650   VLDL 49 (H) 08/22/2010 0650   LDLCALC 92 12/29/2015 1720    Physical Exam:    VS:  BP 124/70 (BP Location: Left Arm, Patient Position: Sitting, Cuff Size: Normal)   Pulse 60   Ht 6' (1.829 m)   Wt 209 lb 6.4 oz (95 kg)   SpO2 98%   BMI 28.40 kg/m     Wt Readings from Last 3 Encounters:  06/08/17 209 lb 6.4 oz (95 kg)  12/07/16 216 lb (98 kg)  11/03/16 210 lb (95.3 kg)     GEN: Patient is in no acute distress HEENT: Normal NECK: No JVD; No carotid bruits LYMPHATICS: No lymphadenopathy CARDIAC: Hear sounds regular, 2/6 systolic murmur at the apex. RESPIRATORY:  Clear to auscultation without rales, wheezing or rhonchi  ABDOMEN: Soft, non-tender,  non-distended MUSCULOSKELETAL:  No edema; No deformity  SKIN: Warm and dry NEUROLOGIC:  Alert and oriented x 3 PSYCHIATRIC:  Normal affect   Signed, Jenean Lindau, MD  06/08/2017 9:56 AM    [place]

## 2017-06-09 LAB — BASIC METABOLIC PANEL
BUN / CREAT RATIO: 23 (ref 10–24)
BUN: 27 mg/dL (ref 8–27)
CO2: 22 mmol/L (ref 20–29)
CREATININE: 1.2 mg/dL (ref 0.76–1.27)
Calcium: 9.6 mg/dL (ref 8.6–10.2)
Chloride: 104 mmol/L (ref 96–106)
GFR, EST AFRICAN AMERICAN: 75 mL/min/{1.73_m2} (ref >59)
GFR, EST NON AFRICAN AMERICAN: 65 mL/min/{1.73_m2} (ref >59)
GLUCOSE: 135 mg/dL — AB (ref 65–99)
Potassium: 3.9 mmol/L (ref 3.5–5.2)
Sodium: 145 mmol/L — ABNORMAL HIGH (ref 134–144)

## 2017-06-20 DIAGNOSIS — G20 Parkinson's disease: Secondary | ICD-10-CM | POA: Diagnosis not present

## 2017-06-20 DIAGNOSIS — M792 Neuralgia and neuritis, unspecified: Secondary | ICD-10-CM | POA: Diagnosis not present

## 2017-10-11 DIAGNOSIS — M545 Low back pain: Secondary | ICD-10-CM | POA: Diagnosis not present

## 2017-10-11 DIAGNOSIS — M419 Scoliosis, unspecified: Secondary | ICD-10-CM | POA: Insufficient documentation

## 2017-10-11 DIAGNOSIS — M5136 Other intervertebral disc degeneration, lumbar region: Secondary | ICD-10-CM | POA: Diagnosis not present

## 2017-10-11 HISTORY — DX: Scoliosis, unspecified: M41.9

## 2017-12-19 DIAGNOSIS — M5136 Other intervertebral disc degeneration, lumbar region: Secondary | ICD-10-CM | POA: Insufficient documentation

## 2017-12-19 DIAGNOSIS — M545 Low back pain, unspecified: Secondary | ICD-10-CM | POA: Insufficient documentation

## 2017-12-19 DIAGNOSIS — M51369 Other intervertebral disc degeneration, lumbar region without mention of lumbar back pain or lower extremity pain: Secondary | ICD-10-CM

## 2017-12-19 DIAGNOSIS — M419 Scoliosis, unspecified: Secondary | ICD-10-CM | POA: Diagnosis not present

## 2017-12-19 HISTORY — DX: Low back pain, unspecified: M54.50

## 2017-12-19 HISTORY — DX: Other intervertebral disc degeneration, lumbar region: M51.36

## 2017-12-19 HISTORY — DX: Other intervertebral disc degeneration, lumbar region without mention of lumbar back pain or lower extremity pain: M51.369

## 2017-12-29 DIAGNOSIS — M545 Low back pain: Secondary | ICD-10-CM | POA: Diagnosis not present

## 2018-01-11 ENCOUNTER — Encounter: Payer: Self-pay | Admitting: Cardiology

## 2018-01-11 ENCOUNTER — Ambulatory Visit: Payer: Medicare Other | Admitting: Cardiology

## 2018-01-11 VITALS — BP 120/70 | HR 67 | Ht 72.0 in | Wt 206.0 lb

## 2018-01-11 DIAGNOSIS — M5136 Other intervertebral disc degeneration, lumbar region: Secondary | ICD-10-CM | POA: Diagnosis not present

## 2018-01-11 DIAGNOSIS — M419 Scoliosis, unspecified: Secondary | ICD-10-CM | POA: Diagnosis not present

## 2018-01-11 DIAGNOSIS — G20 Parkinson's disease: Secondary | ICD-10-CM

## 2018-01-11 DIAGNOSIS — I480 Paroxysmal atrial fibrillation: Secondary | ICD-10-CM | POA: Diagnosis not present

## 2018-01-11 DIAGNOSIS — I447 Left bundle-branch block, unspecified: Secondary | ICD-10-CM

## 2018-01-11 DIAGNOSIS — M545 Low back pain: Secondary | ICD-10-CM | POA: Diagnosis not present

## 2018-01-11 NOTE — Progress Notes (Signed)
Cardiology Office Note:    Date:  01/11/2018   ID:  John Norton, DOB 1955/04/27, MRN [id number]  PCP:  Claretta Fraise, MD  Cardiologist:  Jenean Lindau, MD   Referring MD: Claretta Fraise, MD    ASSESSMENT:    1. Paroxysmal atrial fibrillation (HCC)   2. LBBB (left bundle branch block)   3. Parkinson's disease ([place])    PLAN:    In order of problems listed above:  1. Secondary prevention stressed with the patient.  Importance of compliance with diet and medication stressed and he vocalized understanding.  His blood pressure is stable. 2. Atrial fibrillation issues were discussed he has not had any significant symptoms with palpitations dizziness or any such problems. 3. Patient will be seen in follow-up appointment in 6 months or earlier if the patient has any concerns    Medication Adjustments/Labs and Tests Ordered: Current medicines are reviewed at length with the patient today.  Concerns regarding medicines are outlined above.  No orders of the defined types were placed in this encounter.  No orders of the defined types were placed in this encounter.    No chief complaint on file.    History of Present Illness:    John Norton is a 62 y.o. male.  Patient has history of paroxysmal atrial fibrillation.  He denies any problems at this time and takes care of activities of daily living.  No chest pain orthopnea PND.  At the time of my evaluation, the patient is alert awake oriented and in no distress.  Past Medical History:  Diagnosis Date  . ED (erectile dysfunction)   . History of kidney stones YRS AGO  . LBBB (left bundle branch block)   . Neuropathy    FOREHEAD BURNING AND NUMBNESS  . Parkinson's disease   . Plantar fasciitis 2017   IMPROVED  . Sleep apnea    does not use cpap, NO SLEEP APNEA SINCE QUIT SMOKING 2012  . Spinal stenosis   . Tremors of nervous system     Past Surgical History:  Procedure Laterality Date  . "spot on lung"  SEVERAL  YRS AGO   SPOT [place]  . CHEST X-RAY  08/21/10   No cardiopulmonary process noted.  . CHOLECYSTECTOMY N/A 04/20/2012   Procedure: LAPAROSCOPIC CHOLECYSTECTOMY;  Surgeon: Jamesetta So, MD;  Location: [facility];  Service: General;  Laterality: N/A;  . [place]  . LEXISCAN MYOVIEW  08/22/10    There is no stress-induced ischemia,  but a fixed deficit in the lateral apex and inferior wall.  Ejection fraction 51%  . LUMBAR LAMINECTOMY/DECOMPRESSION MICRODISCECTOMY N/A 11/03/2016   Procedure: Microlumbar Decompression L4-5, L5-S1;  Surgeon: Susa Day, MD;  Location: [facility];  Service: Orthopedics;  Laterality: N/A;  120 mins  . THYROID LOBECTOMY     RIGHT THYROID    Current Medications: Current Meds  Medication Sig  . amantadine (SYMMETREL) 100 MG capsule Take 100 mg by mouth 3 (three) times daily.  Marland Kitchen aspirin EC 325 MG tablet Take 325 mg by mouth daily.  . carbidopa-levodopa (SINEMET) 25-100 MG per tablet Take 1.5 tablets by mouth 4 (four) times daily.   . DOCOSAHEXAENOIC ACID PO Take by mouth.  Marland Kitchen ibuprofen (ADVIL,MOTRIN) 200 MG tablet Take 3 tablets (600 mg total) by mouth every 8 (eight) hours as needed for headache or mild pain. Resume 5 days post-op as needed  . Multiple Vitamin (MULTI-VITAMINS) TABS Take by mouth.  . predniSONE (  DELTASONE) 5 MG tablet prednisone 5 mg tablet  Take 1 tablet every day by oral route as needed.  . pregabalin (LYRICA) 200 MG capsule Take 1 capsule (200 mg total) by mouth 2 (two) times daily.  . propranolol ER (INDERAL LA) 60 MG 24 hr capsule Take 60 mg by mouth daily.  . selegiline (ELDEPRYL) 5 MG capsule Take 1 capsule by mouth 2 (two) times daily.  . sildenafil (VIAGRA) 100 MG tablet Take 100 mg by mouth daily as needed for erectile dysfunction.      Allergies:   Codeine   Social History   Socioeconomic History  . Marital status: Married    Spouse name: Not on file  . Number of children: 1  . Years of education: 51  . Highest  education level: Not on file  Occupational History  . Occupation: disability  Social Needs  . Financial resource strain: Not on file  . Food insecurity:    Worry: Not on file    Inability: Not on file  . Transportation needs:    Medical: Not on file    Non-medical: Not on file  Tobacco Use  . Smoking status: Former Smoker    Packs/day: 1.50    Years: 40.00    Pack years: 60.00    Types: Cigarettes    Last attempt to quit: 07/30/2010    Years since quitting: 7.4  . Smokeless tobacco: Current User    Types: Chew  Substance and Sexual Activity  . Alcohol use: No  . Drug use: No  . Sexual activity: Not on file  Lifestyle  . Physical activity:    Days per week: Not on file    Minutes per session: Not on file  . Stress: Not on file  Relationships  . Social connections:    Talks on phone: Not on file    Gets together: Not on file    Attends religious service: Not on file    Active member of club or organization: Not on file    Attends meetings of clubs or organizations: Not on file    Relationship status: Not on file  Other Topics Concern  . Not on file  Social History Narrative   Lives with wife in a one story home.  Has one daughter.  On disability.  Education: 12th     Family History: The patient's family history includes Colon polyps in his brother. There is no history of Colon cancer, Rectal cancer, Stomach cancer, or Esophageal cancer.  ROS:   Please see the history of present illness.    All other systems reviewed and are negative.  EKGs/Labs/Other Studies Reviewed:    The following studies were reviewed today: EKG reveals sinus rhythm with left bundle branch block.   Recent Labs: 06/08/2017: BUN 27; Creatinine, Ser 1.20; Potassium 3.9; Sodium 145  Recent Lipid Panel    Component Value Date/Time   CHOL 164 12/29/2015 1720   TRIG 226 (H) 12/29/2015 1720   HDL 27 (L) 12/29/2015 1720   CHOLHDL 6.1 (H) 12/29/2015 1720   CHOLHDL 6.7 08/22/2010 0650   VLDL 49  (H) 08/22/2010 0650   LDLCALC 92 12/29/2015 1720    Physical Exam:    VS:  BP 120/70 (BP Location: Right Arm, Patient Position: Sitting, Cuff Size: Normal)   Pulse 67   Ht 6' (1.829 m)   Wt 206 lb (93.4 kg)   SpO2 98%   BMI 27.94 kg/m     Wt Readings from Last 3  Encounters:  01/11/18 206 lb (93.4 kg)  06/08/17 209 lb 6.4 oz (95 kg)  12/07/16 216 lb (98 kg)     GEN: Patient is in no acute distress HEENT: Normal NECK: No JVD; No carotid bruits LYMPHATICS: No lymphadenopathy CARDIAC: Hear sounds regular, 2/6 systolic murmur at the apex. RESPIRATORY:  Clear to auscultation without rales, wheezing or rhonchi  ABDOMEN: Soft, non-tender, non-distended MUSCULOSKELETAL:  No edema; No deformity  SKIN: Warm and dry NEUROLOGIC:  Alert and oriented x 3 PSYCHIATRIC:  Normal affect   Signed, Jenean Lindau, MD  01/11/2018 11:36 AM    [place]

## 2018-01-11 NOTE — Patient Instructions (Signed)
Medication Instructions:  Your physician recommends that you continue on your current medications as directed. Please refer to the Current Medication list given to you today.  If you need a refill on your cardiac medications before your next appointment, please call your pharmacy.   Lab work: None  If you have labs (blood work) drawn today and your tests are completely normal, you will receive your results only by: Marland Kitchen MyChart Message (if you have MyChart) OR . A paper copy in the mail If you have any lab test that is abnormal or we need to change your treatment, we will call you to review the results.  Testing/Procedures: None  Follow-Up: At [facility], you and your health needs are our priority.  As part of our continuing mission to provide you with exceptional heart care, we have created designated Provider Care Teams.  These Care Teams include your primary Cardiologist (physician) and Advanced Practice Providers (APPs -  Physician Assistants and Nurse Practitioners) who all work together to provide you with the care you need, when you need it.  You will need a follow up appointment in 6 months.  Please call our office 2 months in advance to schedule this appointment.  You may see another member of our [employer] Provider Team in [place]: Jenne Campus, MD . Shirlee More, MD  Any Other Special Instructions Will Be Listed Below (If Applicable).

## 2018-01-17 DIAGNOSIS — Z885 Allergy status to narcotic agent status: Secondary | ICD-10-CM | POA: Diagnosis not present

## 2018-01-17 DIAGNOSIS — M792 Neuralgia and neuritis, unspecified: Secondary | ICD-10-CM | POA: Diagnosis not present

## 2018-01-17 DIAGNOSIS — R251 Tremor, unspecified: Secondary | ICD-10-CM | POA: Diagnosis not present

## 2018-01-17 DIAGNOSIS — G20 Parkinson's disease: Secondary | ICD-10-CM | POA: Diagnosis not present

## 2018-02-06 DIAGNOSIS — M5136 Other intervertebral disc degeneration, lumbar region: Secondary | ICD-10-CM | POA: Diagnosis not present

## 2018-02-24 DIAGNOSIS — M5136 Other intervertebral disc degeneration, lumbar region: Secondary | ICD-10-CM | POA: Diagnosis not present

## 2018-02-24 DIAGNOSIS — M961 Postlaminectomy syndrome, not elsewhere classified: Secondary | ICD-10-CM | POA: Diagnosis not present

## 2018-03-02 DIAGNOSIS — M5136 Other intervertebral disc degeneration, lumbar region: Secondary | ICD-10-CM | POA: Diagnosis not present

## 2018-03-02 DIAGNOSIS — M419 Scoliosis, unspecified: Secondary | ICD-10-CM | POA: Diagnosis not present

## 2018-03-15 ENCOUNTER — Ambulatory Visit (INDEPENDENT_AMBULATORY_CARE_PROVIDER_SITE_OTHER): Payer: Medicare Other | Admitting: Family Medicine

## 2018-03-15 ENCOUNTER — Other Ambulatory Visit: Payer: Self-pay | Admitting: Family Medicine

## 2018-03-15 ENCOUNTER — Ambulatory Visit (INDEPENDENT_AMBULATORY_CARE_PROVIDER_SITE_OTHER): Payer: Medicare Other

## 2018-03-15 ENCOUNTER — Encounter: Payer: Self-pay | Admitting: Family Medicine

## 2018-03-15 VITALS — BP 130/77 | HR 64 | Temp 97.0°F | Ht 72.0 in | Wt 215.0 lb

## 2018-03-15 DIAGNOSIS — Z01818 Encounter for other preprocedural examination: Secondary | ICD-10-CM

## 2018-03-15 DIAGNOSIS — R911 Solitary pulmonary nodule: Secondary | ICD-10-CM

## 2018-03-15 LAB — URINALYSIS
Bilirubin, UA: NEGATIVE
Glucose, UA: NEGATIVE
Leukocytes, UA: NEGATIVE
NITRITE UA: NEGATIVE
Protein, UA: NEGATIVE
RBC, UA: NEGATIVE
Specific Gravity, UA: >1.03 — ABNORMAL HIGH (ref 1.005–1.030)
UUROB: 0.2 mg/dL (ref 0.2–1.0)
pH, UA: 5.5 (ref 5.0–7.5)

## 2018-03-15 LAB — BAYER DCA HB A1C WAIVED: HB A1C (BAYER DCA - WAIVED): 5.6 % (ref <7.0)

## 2018-03-15 NOTE — Progress Notes (Addendum)
    Pt is a 63 y.o. male who is here for preoperative clearance for lumbar spine surgery  1) High Risk Cardiac Conditions  1) Recent MI - No.  2) Decompensated Heart Failure - No.  3) Unstable angina - No.  4) Symptomatic arrythmia - No. History of LBBB and paroxysmal atrial fibrillation, will see Cardiology on 03/19/2018 for clearance.   5) Sx Valvular Disease - No.  2) Intermediate Risk Factors - DM, CKD, CVA, CHF, CAD - Yes.   LBBB and paroxysmal atrial fibrillation.   3) Surgery Specific Risk - Intermediate (Carotid, Head and Neck, Orthopaedic )         4) Further Noninvasive evaluation -   1) EKG - No. Will have at cardiology visit on 03/19/2018     Vitals:   03/15/18 0912  BP: 130/77  Pulse: 64  Temp: (!) 97 F (36.1 C)   Physical Examination: General appearance - alert, well appearing, and in no distress, oriented to person, place, and time and well hydrated Mental status - alert, oriented to person, place, and time Eyes - pupils equal and reactive, extraocular eye movements intact Ears - bilateral TM's and external ear canals normal Nose - normal and patent, no erythema, discharge or polyps Mouth - mucous membranes moist, pharynx normal without lesions Neck - supple, no significant adenopathy Chest - clear to auscultation, no wheezes, rales or rhonchi, symmetric air entry Heart - S1 and S2 normal, no murmurs noted, irregularly irregular rhythm with rate 64 Abdomen - soft, nontender, nondistended, no masses or organomegaly Neurological - alert, oriented, normal speech, no focal findings or movement disorder noted Musculoskeletal - no joint tenderness, deformity or swelling Extremities - peripheral pulses normal, no pedal edema, no clubbing or cyanosis Skin - normal coloration and turgor, no rashes, no suspicious skin lesions noted   I have independently evaluated patient.  John Norton is a 63 y.o. male who is moderate risk for a intermediate risk surgery.  There  are not modifiable risk factors (smoking, etc).  A1C 5.6 Urinalysis trace ketones, negative nitrites, leukocytes, and blood.   John Norton was seen today for pre-operative clearance.  Diagnoses and all orders for this visit:  Pre-operative clearance Will see cardiology on 03/19/2018 for further evaluation and final clearance.  -     Cancel: EKG 12-Lead -     CBC with Differential/Platelet -     BMP8+EGFR -     Urinalysis -     Bayer DCA Hb A1c Waived -     Microalbumin / creatinine urine ratio -     Protime-INR -     DG Chest 2 View; Future   CXR ordered. Labs ordered.  EKG will be completed by cardiology on 03/19/2018 Review meds: NO ACE-I/ARB day of surgery. OK to do BB or statin day of esp if vascular sx)  Michelle Rakes, FNP-C [facility] [address] [place], [place] [address] [phone]        

## 2018-03-15 NOTE — Patient Instructions (Signed)
Follow up with cardiology

## 2018-03-16 LAB — CBC WITH DIFFERENTIAL/PLATELET
Basophils Absolute: 0.1 10*3/uL (ref 0.0–0.2)
Basos: 1 %
EOS (ABSOLUTE): 0.1 10*3/uL (ref 0.0–0.4)
EOS: 2 %
Hematocrit: 43.6 % (ref 37.5–51.0)
Hemoglobin: 15.2 g/dL (ref 13.0–17.7)
IMMATURE GRANS (ABS): 0.1 10*3/uL (ref 0.0–0.1)
IMMATURE GRANULOCYTES: 1 %
Lymphocytes Absolute: 2.4 10*3/uL (ref 0.7–3.1)
Lymphs: 39 %
MCH: 31.8 pg (ref 26.6–33.0)
MCHC: 34.9 g/dL (ref 31.5–35.7)
MCV: 91 fL (ref 79–97)
MONOS ABS: 0.5 10*3/uL (ref 0.1–0.9)
Monocytes: 9 %
NEUTROS ABS: 3 10*3/uL (ref 1.4–7.0)
NEUTROS PCT: 48 %
Platelets: 223 10*3/uL (ref 150–450)
RBC: 4.78 x10E6/uL (ref 4.14–5.80)
RDW: 12.8 % (ref 11.6–15.4)
WBC: 6.2 10*3/uL (ref 3.4–10.8)

## 2018-03-16 LAB — MICROALBUMIN / CREATININE URINE RATIO
Creatinine, Urine: 156.9 mg/dL
MICROALB/CREAT RATIO: 8.3 mg/g{creat} (ref 0.0–30.0)
Microalbumin, Urine: 13.1 ug/mL

## 2018-03-16 LAB — BMP8+EGFR
BUN/Creatinine Ratio: 18 (ref 10–24)
BUN: 21 mg/dL (ref 8–27)
CALCIUM: 9.4 mg/dL (ref 8.6–10.2)
CO2: 24 mmol/L (ref 20–29)
Chloride: 99 mmol/L (ref 96–106)
Creatinine, Ser: 1.17 mg/dL (ref 0.76–1.27)
GFR, EST AFRICAN AMERICAN: 77 mL/min/{1.73_m2} (ref >59)
GFR, EST NON AFRICAN AMERICAN: 67 mL/min/{1.73_m2} (ref >59)
Glucose: 135 mg/dL — ABNORMAL HIGH (ref 65–99)
Potassium: 3.9 mmol/L (ref 3.5–5.2)
Sodium: 139 mmol/L (ref 134–144)

## 2018-03-16 LAB — PROTIME-INR
INR: 1 (ref 0.8–1.2)
Prothrombin Time: 10.9 s (ref 9.1–12.0)

## 2018-03-19 ENCOUNTER — Encounter: Payer: Self-pay | Admitting: Cardiology

## 2018-03-19 ENCOUNTER — Ambulatory Visit: Payer: Medicare Other | Admitting: Cardiology

## 2018-03-19 VITALS — BP 126/78 | HR 63 | Ht 72.0 in | Wt 215.0 lb

## 2018-03-19 DIAGNOSIS — I447 Left bundle-branch block, unspecified: Secondary | ICD-10-CM

## 2018-03-19 DIAGNOSIS — I480 Paroxysmal atrial fibrillation: Secondary | ICD-10-CM | POA: Diagnosis not present

## 2018-03-19 DIAGNOSIS — G20 Parkinson's disease: Secondary | ICD-10-CM

## 2018-03-19 DIAGNOSIS — Z01810 Encounter for preprocedural cardiovascular examination: Secondary | ICD-10-CM | POA: Diagnosis not present

## 2018-03-19 NOTE — Progress Notes (Signed)
Cardiology Office Note:    Date:  03/19/2018   ID:  John Norton, DOB 13-Mar-1955, MRN [id number]  PCP:  John Fraise, MD  Cardiologist:  John Lindau, MD   Referring MD: John Fraise, MD    ASSESSMENT:    1. Pre-operative cardiovascular examination   2. LBBB (left bundle branch block)   3. Paroxysmal atrial fibrillation (HCC)   4. Parkinson's disease ([place])    PLAN:    In order of problems listed above:  1. I discussed my findings with the patient at extensive length.  Clinically he is stable at low level.  Because of multiple risk factors for coronary artery disease and sedentary lifestyle we will do a Lexiscan sestamibi.  If this is negative then he is not at high risk for coronary events during the aforementioned surgery.  Meticulous hemodynamic monitoring and continued perioperative blockade Will further reduce the risk of coronary events.   Medication Adjustments/Labs and Tests Ordered: Current medicines are reviewed at length with the patient today.  Concerns regarding medicines are outlined above.  No orders of the defined types were placed in this encounter.  No orders of the defined types were placed in this encounter.    No chief complaint on file.    History of Present Illness:    John Norton is a 63 y.o. male.  Patient has past medical history approximately fibrillation, Parkinson's disease.  He is here for preop assessment.  He plans to undergo significant back surgery.  He denies any chest pain orthopnea or PND.  Because of her unstable gait he is leading a sedentary lifestyle.  At the time of my evaluation, the patient is alert awake oriented and in no distress.  Past Medical History:  Diagnosis Date  . ED (erectile dysfunction)   . History of kidney stones YRS AGO  . LBBB (left bundle branch block)   . Neuropathy    FOREHEAD BURNING AND NUMBNESS  . Parkinson's disease   . Plantar fasciitis 2017   IMPROVED  . Sleep apnea    does not use  cpap, NO SLEEP APNEA SINCE QUIT SMOKING 2012  . Spinal stenosis   . Tremors of nervous system     Past Surgical History:  Procedure Laterality Date  . "spot on lung"  SEVERAL YRS AGO   SPOT [place]  . CHEST X-RAY  08/21/10   No cardiopulmonary process noted.  . CHOLECYSTECTOMY N/A 04/20/2012   Procedure: LAPAROSCOPIC CHOLECYSTECTOMY;  Surgeon: Jamesetta So, MD;  Location: [facility];  Service: General;  Laterality: N/A;  . [place]  . LEXISCAN MYOVIEW  08/22/10    There is no stress-induced ischemia,  but a fixed deficit in the lateral apex and inferior wall.  Ejection fraction 51%  . LUMBAR LAMINECTOMY/DECOMPRESSION MICRODISCECTOMY N/A 11/03/2016   Procedure: Microlumbar Decompression L4-5, L5-S1;  Surgeon: Susa Day, MD;  Location: [facility];  Service: Orthopedics;  Laterality: N/A;  120 mins  . THYROID LOBECTOMY     RIGHT THYROID    Current Medications: Current Meds  Medication Sig  . amantadine (SYMMETREL) 100 MG capsule Take 100 mg by mouth 3 (three) times daily.  Marland Kitchen aspirin EC 81 MG tablet Take 81 mg by mouth daily.   . carbidopa-levodopa (SINEMET) 25-100 MG per tablet Take 1.5 tablets by mouth 4 (four) times daily.   Marland Kitchen ibuprofen (ADVIL,MOTRIN) 200 MG tablet Take 3 tablets (600 mg total) by mouth every 8 (eight) hours as needed for  headache or mild pain. Resume 5 days post-op as needed  . Multiple Vitamin (MULTI-VITAMINS) TABS Take 1 tablet by mouth daily.   . pregabalin (LYRICA) 200 MG capsule Take 1 capsule (200 mg total) by mouth 2 (two) times daily.  . propranolol ER (INDERAL LA) 60 MG 24 hr capsule Take 60 mg by mouth daily.  . selegiline (ELDEPRYL) 5 MG capsule Take 1 capsule by mouth 2 (two) times daily.  . sildenafil (VIAGRA) 100 MG tablet Take 100 mg by mouth daily as needed for erectile dysfunction.      Allergies:   Codeine   Social History   Socioeconomic History  . Marital status: Married    Spouse name: Not on file  . Number of children:  1  . Years of education: 37  . Highest education level: Not on file  Occupational History  . Occupation: disability  Social Needs  . Financial resource strain: Not on file  . Food insecurity:    Worry: Not on file    Inability: Not on file  . Transportation needs:    Medical: Not on file    Non-medical: Not on file  Tobacco Use  . Smoking status: Former Smoker    Packs/day: 1.50    Years: 40.00    Pack years: 60.00    Types: Cigarettes    Last attempt to quit: 07/30/2010    Years since quitting: 7.6  . Smokeless tobacco: Current User    Types: Chew  Substance and Sexual Activity  . Alcohol use: No  . Drug use: No  . Sexual activity: Not on file  Lifestyle  . Physical activity:    Days per week: Not on file    Minutes per session: Not on file  . Stress: Not on file  Relationships  . Social connections:    Talks on phone: Not on file    Gets together: Not on file    Attends religious service: Not on file    Active member of club or organization: Not on file    Attends meetings of clubs or organizations: Not on file    Relationship status: Not on file  Other Topics Concern  . Not on file  Social History Narrative   Lives with wife in a one story home.  Has one daughter.  On disability.  Education: 12th     Family History: The patient's family history includes Colon polyps in his brother. There is no history of Colon cancer, Rectal cancer, Stomach cancer, or Esophageal cancer.  ROS:   Please see the history of present illness.    All other systems reviewed and are negative.  EKGs/Labs/Other Studies Reviewed:    The following studies were reviewed today: EKG reveals sinus rhythm and left bundle branch block.   Recent Labs: 03/15/2018: BUN 21; Creatinine, Ser 1.17; Hemoglobin 15.2; Platelets 223; Potassium 3.9; Sodium 139  Recent Lipid Panel    Component Value Date/Time   CHOL 164 12/29/2015 1720   TRIG 226 (H) 12/29/2015 1720   HDL 27 (L) 12/29/2015 1720    CHOLHDL 6.1 (H) 12/29/2015 1720   CHOLHDL 6.7 08/22/2010 0650   VLDL 49 (H) 08/22/2010 0650   LDLCALC 92 12/29/2015 1720    Physical Exam:    VS:  BP 126/78 (BP Location: Right Arm, Patient Position: Sitting, Cuff Size: Normal)   Pulse 63   Ht 6' (1.829 m)   Wt 215 lb (97.5 kg)   SpO2 99%   BMI 29.16 kg/m  Wt Readings from Last 3 Encounters:  03/19/18 215 lb (97.5 kg)  03/15/18 215 lb (97.5 kg)  01/11/18 206 lb (93.4 kg)     GEN: Patient is in no acute distress HEENT: Normal NECK: No JVD; No carotid bruits LYMPHATICS: No lymphadenopathy CARDIAC: Hear sounds regular, 2/6 systolic murmur at the apex. RESPIRATORY:  Clear to auscultation without rales, wheezing or rhonchi  ABDOMEN: Soft, non-tender, non-distended MUSCULOSKELETAL:  No edema; No deformity  SKIN: Warm and dry NEUROLOGIC:  Alert and oriented x 3 PSYCHIATRIC:  Normal affect   Signed, John Lindau, MD  03/19/2018 10:57 AM    [place]

## 2018-03-19 NOTE — Patient Instructions (Signed)
Medication Instructions:   Your physician recommends that you continue on your current medications as directed. Please refer to the Current Medication list given to you today.  If you need a refill on your cardiac medications before your next appointment, please call your pharmacy.   Lab work:  NONE  If you have labs (blood work) drawn today and your tests are completely normal, you will receive your results only by: Marland Kitchen MyChart Message (if you have MyChart) OR . A paper copy in the mail If you have any lab test that is abnormal or we need to change your treatment, we will call you to review the results.  Testing/Procedures:  Your physician has requested that you have a lexiscan myoview. For further information please visit [email or web address]. Please follow instruction sheet, as given.    Follow-Up: At [facility], you and your health needs are our priority.  As part of our continuing mission to provide you with exceptional heart care, we have created designated Provider Care Teams.  These Care Teams include your primary Cardiologist (physician) and Advanced Practice Providers (APPs -  Physician Assistants and Nurse Practitioners) who all work together to provide you with the care you need, when you need it.  You will need a follow up appointment in 6 months.

## 2018-03-20 ENCOUNTER — Telehealth (HOSPITAL_COMMUNITY): Payer: Self-pay | Admitting: *Deleted

## 2018-03-20 NOTE — Telephone Encounter (Signed)
Patient given detailed instructions per Myocardial Perfusion Study Information Sheet for the test on 03/23/18 at 0745. Patient notified to arrive 15 minutes early and that it is imperative to arrive on time for appointment to keep from having the test rescheduled.  If you need to cancel or reschedule your appointment, please call the office within 24 hours of your appointment. . Patient verbalized understanding.John Norton, Ranae [place] No Mychart

## 2018-03-21 ENCOUNTER — Other Ambulatory Visit: Payer: Self-pay | Admitting: *Deleted

## 2018-03-23 ENCOUNTER — Ambulatory Visit (HOSPITAL_COMMUNITY): Payer: Medicare Other | Attending: Cardiovascular Disease

## 2018-03-23 VITALS — Ht 72.0 in | Wt 215.0 lb

## 2018-03-23 DIAGNOSIS — I447 Left bundle-branch block, unspecified: Secondary | ICD-10-CM | POA: Diagnosis not present

## 2018-03-23 DIAGNOSIS — G20 Parkinson's disease: Secondary | ICD-10-CM | POA: Diagnosis not present

## 2018-03-23 DIAGNOSIS — Z01810 Encounter for preprocedural cardiovascular examination: Secondary | ICD-10-CM | POA: Insufficient documentation

## 2018-03-23 DIAGNOSIS — I480 Paroxysmal atrial fibrillation: Secondary | ICD-10-CM | POA: Diagnosis not present

## 2018-03-23 LAB — MYOCARDIAL PERFUSION IMAGING
CHL CUP NUCLEAR SDS: 3
CHL CUP NUCLEAR SRS: 1
CHL CUP RESTING HR STRESS: 64 {beats}/min
LV dias vol: 149 mL (ref 62–150)
LV sys vol: 103 mL
NUC STRESS TID: 1.11
Peak HR: 78 {beats}/min
SSS: 4

## 2018-03-23 MED ORDER — TECHNETIUM TC 99M TETROFOSMIN IV KIT
10.2000 | PACK | Freq: Once | INTRAVENOUS | Status: AC | PRN
  Administered 2018-03-23: 10.2 via INTRAVENOUS
  Filled 2018-03-23: qty 11

## 2018-03-23 MED ORDER — TECHNETIUM TC 99M TETROFOSMIN IV KIT
31.1000 | PACK | Freq: Once | INTRAVENOUS | Status: AC | PRN
  Administered 2018-03-23: 31.1 via INTRAVENOUS
  Filled 2018-03-23: qty 32

## 2018-03-23 MED ORDER — REGADENOSON 0.4 MG/5ML IV SOLN
0.4000 mg | Freq: Once | INTRAVENOUS | Status: AC
  Administered 2018-03-23: 0.4 mg via INTRAVENOUS

## 2018-03-26 ENCOUNTER — Telehealth: Payer: Self-pay

## 2018-03-26 ENCOUNTER — Ambulatory Visit: Payer: Medicare Other | Admitting: Cardiology

## 2018-03-26 ENCOUNTER — Telehealth: Payer: Self-pay | Admitting: Emergency Medicine

## 2018-03-26 DIAGNOSIS — I480 Paroxysmal atrial fibrillation: Secondary | ICD-10-CM

## 2018-03-26 DIAGNOSIS — Z01810 Encounter for preprocedural cardiovascular examination: Secondary | ICD-10-CM

## 2018-03-26 NOTE — Telephone Encounter (Signed)
Left message for patient to return call to schedule stat echo per Dr. Geraldo Pitter.

## 2018-03-26 NOTE — Telephone Encounter (Signed)
Patient informed that he will need an echocardiogram per Dr Geraldo Pitter after having a recent nuclear stress test with low EF.  Patient advised to report to [place] for echocardiogram on 03-28-2018 at 09:15.  Patient agreed to plan and verbalized understanding.

## 2018-03-26 NOTE — Telephone Encounter (Signed)
-----   Message from Jenean Lindau, MD sent at 03/26/2018  1:45 PM EST ----- This is a patient for preoperative purposes.  Therefore I would like to get a stat echo since stress test revealed significantly low EF. Jenean Lindau, MD 03/26/2018 1:44 PM

## 2018-03-28 ENCOUNTER — Ambulatory Visit (HOSPITAL_BASED_OUTPATIENT_CLINIC_OR_DEPARTMENT_OTHER)
Admission: RE | Admit: 2018-03-28 | Discharge: 2018-03-28 | Disposition: A | Discharge status: Home / Self Care | Payer: Medicare Other | Source: Ambulatory Visit | Attending: Cardiology | Admitting: Cardiology

## 2018-03-28 DIAGNOSIS — I480 Paroxysmal atrial fibrillation: Secondary | ICD-10-CM | POA: Insufficient documentation

## 2018-03-28 DIAGNOSIS — Z01810 Encounter for preprocedural cardiovascular examination: Secondary | ICD-10-CM | POA: Insufficient documentation

## 2018-03-28 MED ORDER — PERFLUTREN LIPID MICROSPHERE
1.0000 mL | INTRAVENOUS | Status: AC | PRN
  Administered 2018-03-28: 3 mL via INTRAVENOUS
  Filled 2018-03-28: qty 10

## 2018-03-28 NOTE — Progress Notes (Signed)
  Echocardiogram 2D Echocardiogram has been performed.  Barrett Goldie T Nura Cahoon 03/28/2018, 10:22 AM

## 2018-04-05 ENCOUNTER — Other Ambulatory Visit: Payer: Self-pay | Admitting: *Deleted

## 2018-04-05 ENCOUNTER — Telehealth: Payer: Self-pay | Admitting: Vascular Surgery

## 2018-04-05 NOTE — Telephone Encounter (Signed)
sch appt lvm mld ltr 06/05/2018 1015am f/u MD

## 2018-04-05 NOTE — Telephone Encounter (Signed)
-----   Message from John Eddy, RN sent at 04/05/2018  1:31 PM EST ----- Regarding: OFFICE APPT WITH DR. EARLY Please sched. Office appointment with Dr. Donnetta Hutching. ALIF surgery scheduled with Dr. Rolena Infante for 06/20/2018. Approx 2 weeks prior to OR day. Remind patient to bring all lumbar films/etc. To this appointment . Thanks, B

## 2018-04-06 ENCOUNTER — Other Ambulatory Visit: Payer: Self-pay | Admitting: Orthopedic Surgery

## 2018-04-09 ENCOUNTER — Ambulatory Visit: Payer: Self-pay | Admitting: Orthopedic Surgery

## 2018-06-05 ENCOUNTER — Ambulatory Visit: Payer: Medicare Other | Admitting: Vascular Surgery

## 2018-06-20 ENCOUNTER — Encounter (HOSPITAL_COMMUNITY): Admission: RE | Payer: Self-pay | Source: Home / Self Care

## 2018-06-20 ENCOUNTER — Inpatient Hospital Stay (HOSPITAL_COMMUNITY): Admission: RE | Admit: 2018-06-20 | Payer: Medicare Other | Source: Home / Self Care | Admitting: Orthopedic Surgery

## 2018-06-20 SURGERY — ANTERIOR LUMBAR FUSION 2 LEVELS
Anesthesia: General

## 2018-08-21 DIAGNOSIS — G20 Parkinson's disease: Secondary | ICD-10-CM | POA: Diagnosis not present

## 2018-08-21 DIAGNOSIS — R251 Tremor, unspecified: Secondary | ICD-10-CM | POA: Diagnosis not present

## 2018-08-21 DIAGNOSIS — Z79899 Other long term (current) drug therapy: Secondary | ICD-10-CM | POA: Diagnosis not present

## 2018-10-17 DIAGNOSIS — Z79899 Other long term (current) drug therapy: Secondary | ICD-10-CM | POA: Diagnosis not present

## 2018-10-17 DIAGNOSIS — G20 Parkinson's disease: Secondary | ICD-10-CM | POA: Diagnosis not present

## 2018-11-19 ENCOUNTER — Ambulatory Visit: Payer: Medicare Other | Admitting: Cardiology

## 2018-11-20 ENCOUNTER — Ambulatory Visit: Payer: Medicare Other | Admitting: Cardiology

## 2018-11-27 ENCOUNTER — Other Ambulatory Visit: Payer: Self-pay

## 2018-11-27 DIAGNOSIS — Z20822 Contact with and (suspected) exposure to covid-19: Secondary | ICD-10-CM

## 2018-11-28 LAB — NOVEL CORONAVIRUS, NAA: SARS-CoV-2, NAA: DETECTED — AB

## 2018-12-03 ENCOUNTER — Other Ambulatory Visit: Payer: Self-pay

## 2018-12-03 ENCOUNTER — Encounter (HOSPITAL_COMMUNITY): Payer: Self-pay

## 2018-12-03 ENCOUNTER — Telehealth: Payer: Self-pay | Admitting: Family Medicine

## 2018-12-03 ENCOUNTER — Emergency Department (HOSPITAL_COMMUNITY)
Admission: EM | Admit: 2018-12-03 | Discharge: 2018-12-03 | Disposition: A | Discharge status: Home / Self Care | Payer: Medicare Other | Attending: Emergency Medicine | Admitting: Emergency Medicine

## 2018-12-03 ENCOUNTER — Emergency Department (HOSPITAL_COMMUNITY): Payer: Medicare Other

## 2018-12-03 DIAGNOSIS — U071 COVID-19: Secondary | ICD-10-CM | POA: Insufficient documentation

## 2018-12-03 DIAGNOSIS — R0602 Shortness of breath: Secondary | ICD-10-CM

## 2018-12-03 DIAGNOSIS — R509 Fever, unspecified: Secondary | ICD-10-CM | POA: Diagnosis not present

## 2018-12-03 DIAGNOSIS — Z87891 Personal history of nicotine dependence: Secondary | ICD-10-CM | POA: Insufficient documentation

## 2018-12-03 DIAGNOSIS — R05 Cough: Secondary | ICD-10-CM | POA: Diagnosis not present

## 2018-12-03 LAB — COMPREHENSIVE METABOLIC PANEL
ALT: 21 U/L (ref 0–44)
AST: 66 U/L — ABNORMAL HIGH (ref 15–41)
Albumin: 4 g/dL (ref 3.5–5.0)
Alkaline Phosphatase: 98 U/L (ref 38–126)
Anion gap: 13 (ref 5–15)
BUN: 23 mg/dL (ref 8–23)
CO2: 25 mmol/L (ref 22–32)
Calcium: 9.1 mg/dL (ref 8.9–10.3)
Chloride: 103 mmol/L (ref 98–111)
Creatinine, Ser: 1.08 mg/dL (ref 0.61–1.24)
GFR calc Af Amer: >60 mL/min (ref >60)
GFR calc non Af Amer: >60 mL/min (ref >60)
Glucose, Bld: 131 mg/dL — ABNORMAL HIGH (ref 70–99)
Potassium: 4.2 mmol/L (ref 3.5–5.1)
Sodium: 141 mmol/L (ref 135–145)
Total Bilirubin: 0.9 mg/dL (ref 0.3–1.2)
Total Protein: 8 g/dL (ref 6.5–8.1)

## 2018-12-03 LAB — CBC
HCT: 50.3 % (ref 39.0–52.0)
Hemoglobin: 16.1 g/dL (ref 13.0–17.0)
MCH: 29.5 pg (ref 26.0–34.0)
MCHC: 32 g/dL (ref 30.0–36.0)
MCV: 92.3 fL (ref 80.0–100.0)
Platelets: 232 10*3/uL (ref 150–400)
RBC: 5.45 MIL/uL (ref 4.22–5.81)
RDW: 13.4 % (ref 11.5–15.5)
WBC: 6.3 10*3/uL (ref 4.0–10.5)
nRBC: 0 % (ref 0.0–0.2)

## 2018-12-03 MED ORDER — BENZONATATE 100 MG PO CAPS: 100.0000 mg | ORAL_CAPSULE | Freq: Three times a day (TID) | ORAL | 0 refills | Status: DC

## 2018-12-03 MED ORDER — SODIUM CHLORIDE 0.9 % IV BOLUS
1000.0000 mL | Freq: Once | INTRAVENOUS | Status: AC
  Administered 2018-12-03: 1000 mL via INTRAVENOUS

## 2018-12-03 NOTE — Telephone Encounter (Signed)
Please advise 

## 2018-12-03 NOTE — ED Triage Notes (Signed)
Wife reports pt diagnosed with covid on sept 29th.  Reports cough and fever.

## 2018-12-03 NOTE — Telephone Encounter (Signed)
If he is having worsening of symptoms, he should go to UC or ED for evaluation.

## 2018-12-03 NOTE — Discharge Instructions (Addendum)
You were evaluated in the Emergency Department and after careful evaluation, we did not find any emergent condition requiring admission or further testing in the hospital.  Your exam/testing today was overall reassuring.  Your chest x-ray was normal without any evidence of pneumonia.  You can use the Tessalon medication as needed for cough.  We recommend continued home quarantine while you are symptomatic per the CDC recommendations.  Please return to the Emergency Department if you experience any worsening of your condition.  We encourage you to follow up with a primary care provider.  Thank you for allowing [place] to be a part of your care.

## 2018-12-03 NOTE — Telephone Encounter (Signed)
Aware of provider's advice. 

## 2018-12-03 NOTE — ED Notes (Signed)
X-ray at bedside

## 2018-12-03 NOTE — ED Provider Notes (Signed)
[facility] Emergency Department Provider Note MRN:  [id number]  Arrival date & time: 12/03/18     Chief Complaint   covid   History of Present Illness   John Norton is a 63 y.o. year-old male with a history of COVID-19, Parkinson's disease, left bundle branch block presenting to the ED with chief complaint of COVID.  Diagnosed with COVID-19 5 days ago, has been symptomatic for 7 days.  Noticing some increase worsening of symptoms over the past 2 days.  Feeling mildly short of breath, general malaise, body aches, fatigue, sore throat.  Denies chest pain, no abdominal pain, no vomiting, no diarrhea, no leg pain or swelling.  No exacerbating relieving factors.  Review of Systems  A complete 10 system review of systems was obtained and all systems are negative except as noted in the HPI and PMH.   Patient's Health History    Past Medical History:  Diagnosis Date  . ED (erectile dysfunction)   . History of kidney stones YRS AGO  . LBBB (left bundle branch block)   . Neuropathy    FOREHEAD BURNING AND NUMBNESS  . Parkinson's disease   . Plantar fasciitis 2017   IMPROVED  . Sleep apnea    does not use cpap, NO SLEEP APNEA SINCE QUIT SMOKING 2012  . Spinal stenosis   . Tremors of nervous system     Past Surgical History:  Procedure Laterality Date  . "spot on lung"  SEVERAL YRS AGO   SPOT [place]  . CHEST X-RAY  08/21/10   No cardiopulmonary process noted.  . CHOLECYSTECTOMY N/A 04/20/2012   Procedure: LAPAROSCOPIC CHOLECYSTECTOMY;  Surgeon: Jamesetta So, MD;  Location: [facility];  Service: General;  Laterality: N/A;  . [place]  . LEXISCAN MYOVIEW  08/22/10    There is no stress-induced ischemia,  but a fixed deficit in the lateral apex and inferior wall.  Ejection fraction 51%  . LUMBAR LAMINECTOMY/DECOMPRESSION MICRODISCECTOMY N/A 11/03/2016   Procedure: Microlumbar Decompression L4-5, L5-S1;  Surgeon: Susa Day, MD;   Location: [facility];  Service: Orthopedics;  Laterality: N/A;  120 mins  . THYROID LOBECTOMY     RIGHT THYROID    Family History  Problem Relation Age of Onset  . Colon polyps Brother   . Colon cancer Neg Hx   . Rectal cancer Neg Hx   . Stomach cancer Neg Hx   . Esophageal cancer Neg Hx     Social History   Socioeconomic History  . Marital status: Married    Spouse name: Not on file  . Number of children: 1  . Years of education: 100  . Highest education level: Not on file  Occupational History  . Occupation: disability  Social Needs  . Financial resource strain: Not on file  . Food insecurity    Worry: Not on file    Inability: Not on file  . Transportation needs    Medical: Not on file    Non-medical: Not on file  Tobacco Use  . Smoking status: Former Smoker    Packs/day: 1.50    Years: 40.00    Pack years: 60.00    Types: Cigarettes    Quit date: 07/30/2010    Years since quitting: 8.3  . Smokeless tobacco: Current User    Types: Chew  Substance and Sexual Activity  . Alcohol use: No  . Drug use: No  . Sexual activity: Not on file  Lifestyle  .  Physical activity    Days per week: Not on file    Minutes per session: Not on file  . Stress: Not on file  Relationships  . Social Herbalist on phone: Not on file    Gets together: Not on file    Attends religious service: Not on file    Active member of club or organization: Not on file    Attends meetings of clubs or organizations: Not on file    Relationship status: Not on file  . Intimate partner violence    Fear of current or ex partner: Not on file    Emotionally abused: Not on file    Physically abused: Not on file    Forced sexual activity: Not on file  Other Topics Concern  . Not on file  Social History Narrative   Lives with wife in a one story home.  Has one daughter.  On disability.  Education: 12th     Physical Exam  Vital Signs and Nursing Notes reviewed Vitals:   12/03/18 1600  12/03/18 1630  BP: (!) 144/87 (!) 145/90  Pulse: 70 72  Resp:  14  Temp:    SpO2: 94% 99%    CONSTITUTIONAL: Well-appearing, NAD NEURO:  Alert and oriented x 3, no focal deficits EYES:  eyes equal and reactive ENT/NECK:  no LAD, no JVD CARDIO: Regular rate, well-perfused, normal S1 and S2 PULM:  CTAB no wheezing or rhonchi GI/GU:  normal bowel sounds, non-distended, non-tender MSK/SPINE:  No gross deformities, no edema SKIN:  no rash, atraumatic PSYCH:  Appropriate speech and behavior  Diagnostic and Interventional Summary    EKG Interpretation  Date/Time:  Monday December 03 2018 14:51:01 EDT Ventricular Rate:  93 PR Interval:    QRS Duration: 156 QT Interval:  392 QTC Calculation: 417 R Axis:   34 Text Interpretation:  Sinus rhythm Ventricular bigeminy Sinus pause Left bundle branch block Confirmed by Gerlene Fee [phone]) on 12/03/2018 3:41:58 PM      Labs Reviewed  COMPREHENSIVE METABOLIC PANEL - Abnormal; Notable for the following components:      Result Value   Glucose, Bld 131 (*)    AST 66 (*)    All other components within normal limits  CBC    DG Chest Port 1 View  Final Result      Medications  sodium chloride 0.9 % bolus 1,000 mL (1,000 mLs Intravenous New Bag/Given 12/03/18 1609)     Procedures Critical Care  ED Course and Medical Decision Making  I have reviewed the triage vital signs and the nursing notes.  Pertinent labs & imaging results that were available during my care of the patient were reviewed by me and considered in my medical decision making (see below for details).  Confirmed COVID-19, well-appearing, clear lungs, no increased work of breathing, satting 99% on room air, x-ray here is reassuring.  Labs reassuring.  Overall there is no indication for further testing or admission, patient is appropriate for discharge with strict return precautions.  Barth Kirks. Sedonia Small, [place]  [email or web address]  Final Clinical Impressions(s) / ED Diagnoses     ICD-10-CM   1. COVID-19  U07.1   2. SOB (shortness of breath)  R06.02 DG Chest [facility] 1 View    DG Chest Port 1 View    ED Discharge Orders         Ordered    benzonatate (TESSALON) 100 MG capsule  Every  8 hours     12/03/18 1716          Discharge Instructions Discussed with and Provided to Patient:   Discharge Instructions     You were evaluated in the Emergency Department and after careful evaluation, we did not find any emergent condition requiring admission or further testing in the hospital.  Your exam/testing today was overall reassuring.  Your chest x-ray was normal without any evidence of pneumonia.  You can use the Tessalon medication as needed for cough.  We recommend continued home quarantine while you are symptomatic per the CDC recommendations.  Please return to the Emergency Department if you experience any worsening of your condition.  We encourage you to follow up with a primary care provider.  Thank you for allowing [place] to be a part of your care.       Maudie Flakes, MD 12/03/18 [phone]

## 2018-12-11 ENCOUNTER — Ambulatory Visit (INDEPENDENT_AMBULATORY_CARE_PROVIDER_SITE_OTHER): Payer: Medicare Other | Admitting: Family Medicine

## 2018-12-11 ENCOUNTER — Encounter: Payer: Self-pay | Admitting: Family Medicine

## 2018-12-11 ENCOUNTER — Ambulatory Visit: Payer: Medicare Other | Admitting: Physician Assistant

## 2018-12-11 DIAGNOSIS — R05 Cough: Secondary | ICD-10-CM | POA: Diagnosis not present

## 2018-12-11 DIAGNOSIS — U071 COVID-19: Secondary | ICD-10-CM | POA: Diagnosis not present

## 2018-12-11 DIAGNOSIS — R059 Cough, unspecified: Secondary | ICD-10-CM

## 2018-12-11 MED ORDER — GUAIFENESIN-CODEINE 100-10 MG/5ML PO SYRP: 10.0000 mL | ORAL_SOLUTION | Freq: Four times a day (QID) | ORAL | 0 refills | Status: DC | PRN

## 2018-12-11 NOTE — Progress Notes (Signed)
Virtual Visit via Telephone Note  I connected with John Norton on 12/11/18 at 9:01 AM by telephone and verified that I am speaking with the correct person using two identifiers. John Norton is currently located at home and nobody is currently with him during this visit. The provider, Loman Brooklyn, FNP is located in their home at time of visit.  I discussed the limitations, risks, security and privacy concerns of performing an evaluation and management service by telephone and the availability of in person appointments. I also discussed with the patient that there may be a patient responsible charge related to this service. The patient expressed understanding and agreed to proceed.  Subjective: PCP: Baruch Gouty, FNP  Chief Complaint  Patient presents with  . Cough   Patient c/o a "bad cough". He tested positive for COVID-19 two weeks ago. He went to the ER a week ago due to worsening symptoms. Labs and chest x-ray were reassuring. Patient was discharged home. He reports the only lingering symptom is this cough. He has tried Robitussin, Nyquil, [employer], and codeine cough syrup. He reports the codeine cough syrup is the only thing that is helping and allowing him to get some rest. This is listed as an allergy in his chart but he reports he is able to take it as long as he eats something as it makes him nauseated.    ROS: Per HPI  Current Outpatient Medications:  .  amantadine (SYMMETREL) 100 MG capsule, Take 100 mg by mouth 3 (three) times daily., Disp: , Rfl: 5 .  aspirin EC 81 MG tablet, Take 81 mg by mouth daily. , Disp: , Rfl:  .  benzonatate (TESSALON) 100 MG capsule, Take 1 capsule (100 mg total) by mouth every 8 (eight) hours., Disp: 21 capsule, Rfl: 0 .  carbidopa-levodopa (SINEMET) 25-100 MG per tablet, Take 1.5 tablets by mouth 6 (six) times daily. , Disp: , Rfl:  .  ibuprofen (ADVIL,MOTRIN) 200 MG tablet, Take 3 tablets (600 mg total) by mouth every 8 (eight)  hours as needed for headache or mild pain. Resume 5 days post-op as needed, Disp: 30 tablet, Rfl: 0 .  Multiple Vitamin (MULTI-VITAMINS) TABS, Take 1 tablet by mouth daily. , Disp: , Rfl:  .  PE-GG-APAP & PE-DPH-APAP (MUCINEX SINUS-MAX DAY/NIGHT) Liquid MISC, Take 20 mLs by mouth 2 (two) times daily as needed., Disp: , Rfl:  .  pregabalin (LYRICA) 200 MG capsule, Take 1 capsule (200 mg total) by mouth 2 (two) times daily. (Patient taking differently: Take 200 mg by mouth 3 (three) times daily. ), Disp: , Rfl:  .  propranolol ER (INDERAL LA) 60 MG 24 hr capsule, Take 60 mg by mouth daily., Disp: , Rfl:  .  selegiline (ELDEPRYL) 5 MG capsule, Take 1 capsule by mouth 2 (two) times daily., Disp: , Rfl:  .  sildenafil (VIAGRA) 100 MG tablet, Take 100 mg by mouth daily as needed for erectile dysfunction. , Disp: , Rfl:   Allergies  Allergen Reactions  . Codeine Nausea Only   Past Medical History:  Diagnosis Date  . ED (erectile dysfunction)   . History of kidney stones YRS AGO  . LBBB (left bundle branch block)   . Neuropathy    FOREHEAD BURNING AND NUMBNESS  . Parkinson's disease   . Plantar fasciitis 2017   IMPROVED  . Sleep apnea    does not use cpap, NO SLEEP APNEA SINCE QUIT SMOKING 2012  . Spinal stenosis   .  Tremors of nervous system     Observations/Objective: A&O  No respiratory distress or wheezing audible over the phone Mood, judgement, and thought processes all WNL  Assessment and Plan: 1-2. COVID-19/Cough - guaiFENesin-codeine (ROBITUSSIN AC) 100-10 MG/5ML syrup; Take 10 mLs by mouth 4 (four) times daily as needed for cough.  Dispense: 180 mL; Refill: 0   Follow Up Instructions:  I discussed the assessment and treatment plan with the patient. The patient was provided an opportunity to ask questions and all were answered. The patient agreed with the plan and demonstrated an understanding of the instructions.   The patient was advised to call back or seek an in-person  evaluation if the symptoms worsen or if the condition fails to improve as anticipated.  The above assessment and management plan was discussed with the patient. The patient verbalized understanding of and has agreed to the management plan. Patient is aware to call the clinic if symptoms persist or worsen. Patient is aware when to return to the clinic for a follow-up visit. Patient educated on when it is appropriate to go to the emergency department.   Time call ended: 9:04 AM  I provided 5 minutes of non-face-to-face time during this encounter.  Hendricks Limes, MSN, APRN, FNP-C [facility] 12/11/18

## 2018-12-24 ENCOUNTER — Ambulatory Visit (INDEPENDENT_AMBULATORY_CARE_PROVIDER_SITE_OTHER): Payer: Medicare Other | Admitting: Cardiology

## 2018-12-24 ENCOUNTER — Encounter: Payer: Self-pay | Admitting: Cardiology

## 2018-12-24 ENCOUNTER — Other Ambulatory Visit: Payer: Self-pay

## 2018-12-24 VITALS — BP 134/76 | HR 66 | Ht 72.0 in | Wt 203.0 lb

## 2018-12-24 DIAGNOSIS — G20 Parkinson's disease: Secondary | ICD-10-CM

## 2018-12-24 DIAGNOSIS — I480 Paroxysmal atrial fibrillation: Secondary | ICD-10-CM

## 2018-12-24 NOTE — Progress Notes (Signed)
Cardiology Office Note:    Date:  12/24/2018   ID:  [name], DOB 11-01-1955, MRN [id number]  PCP:  Baruch Gouty, FNP  Cardiologist:  Jenean Lindau, MD   Referring MD: Baruch Gouty, FNP    ASSESSMENT:    1. Paroxysmal atrial fibrillation (HCC)   2. Parkinson's disease ([place])    PLAN:    In order of problems listed above:  1. Paroxysmal atrial fibrillation: Patient's CHADS2 score is 1.  Patient hardly has any palpitations or any such issues.  Atrial fibrillation issues were discussed and he vocalized understanding. 2. Essential hypertension: Blood pressure stable 3. Patient will be seen in follow-up appointment in 6 months or earlier if the patient has any concerns    Medication Adjustments/Labs and Tests Ordered: Current medicines are reviewed at length with the patient today.  Concerns regarding medicines are outlined above.  No orders of the defined types were placed in this encounter.  No orders of the defined types were placed in this encounter.    No chief complaint on file.    History of Present Illness:    John Norton is a 63 y.o. male.  Patient has past medical history approximately fibrillation.  He denies any palpitations or any such issues.  No chest pain orthopnea or PND.  He is had a Covid infection and recovered from it.  He tells me that he feels close to normal.  At the time of my evaluation, the patient is alert awake oriented and in no distress.  Past Medical History:  Diagnosis Date  . ED (erectile dysfunction)   . History of kidney stones YRS AGO  . LBBB (left bundle branch block)   . Neuropathy    FOREHEAD BURNING AND NUMBNESS  . Parkinson's disease   . Plantar fasciitis 2017   IMPROVED  . Sleep apnea    does not use cpap, NO SLEEP APNEA SINCE QUIT SMOKING 2012  . Spinal stenosis   . Tremors of nervous system     Past Surgical History:  Procedure Laterality Date  . "spot on lung"  SEVERAL YRS AGO   SPOT [place]  .  CHEST X-RAY  08/21/10   No cardiopulmonary process noted.  . CHOLECYSTECTOMY N/A 04/20/2012   Procedure: LAPAROSCOPIC CHOLECYSTECTOMY;  Surgeon: Jamesetta So, MD;  Location: [facility];  Service: General;  Laterality: N/A;  . [place]  . LEXISCAN MYOVIEW  08/22/10    There is no stress-induced ischemia,  but a fixed deficit in the lateral apex and inferior wall.  Ejection fraction 51%  . LUMBAR LAMINECTOMY/DECOMPRESSION MICRODISCECTOMY N/A 11/03/2016   Procedure: Microlumbar Decompression L4-5, L5-S1;  Surgeon: Susa Day, MD;  Location: [facility];  Service: Orthopedics;  Laterality: N/A;  120 mins  . THYROID LOBECTOMY     RIGHT THYROID    Current Medications: Current Meds  Medication Sig  . amantadine (SYMMETREL) 100 MG capsule Take 100 mg by mouth 3 (three) times daily.  . Ascorbic Acid (VITAMIN C) 100 MG tablet Take 100 mg by mouth daily.  Marland Kitchen aspirin EC 81 MG tablet Take 81 mg by mouth daily.   . benzonatate (TESSALON) 100 MG capsule Take 1 capsule (100 mg total) by mouth every 8 (eight) hours.  . carbidopa-levodopa (SINEMET) 25-100 MG per tablet Take 1.5 tablets by mouth 6 (six) times daily.   Marland Kitchen guaiFENesin-codeine (ROBITUSSIN AC) 100-10 MG/5ML syrup Take 10 mLs by mouth 4 (four) times daily as needed  for cough.  Marland Kitchen ibuprofen (ADVIL,MOTRIN) 200 MG tablet Take 3 tablets (600 mg total) by mouth every 8 (eight) hours as needed for headache or mild pain. Resume 5 days post-op as needed  . Multiple Vitamin (MULTI-VITAMINS) TABS Take 1 tablet by mouth daily.   Marland Kitchen PE-GG-APAP & PE-DPH-APAP (MUCINEX SINUS-MAX DAY/NIGHT) Liquid MISC Take 20 mLs by mouth 2 (two) times daily as needed.  . pregabalin (LYRICA) 200 MG capsule Take 1 capsule (200 mg total) by mouth 2 (two) times daily. (Patient taking differently: Take 200 mg by mouth 3 (three) times daily. )  . propranolol ER (INDERAL LA) 60 MG 24 hr capsule Take 60 mg by mouth daily.  . selegiline (ELDEPRYL) 5 MG capsule Take 1 capsule by  mouth 2 (two) times daily.  . sildenafil (VIAGRA) 100 MG tablet Take 100 mg by mouth daily as needed for erectile dysfunction.      Allergies:   Codeine   Social History   Socioeconomic History  . Marital status: Married    Spouse name: Not on file  . Number of children: 1  . Years of education: 50  . Highest education level: Not on file  Occupational History  . Occupation: disability  Social Needs  . Financial resource strain: Not on file  . Food insecurity    Worry: Not on file    Inability: Not on file  . Transportation needs    Medical: Not on file    Non-medical: Not on file  Tobacco Use  . Smoking status: Former Smoker    Packs/day: 1.50    Years: 40.00    Pack years: 60.00    Types: Cigarettes    Quit date: 07/30/2010    Years since quitting: 8.4  . Smokeless tobacco: Current User    Types: Chew  Substance and Sexual Activity  . Alcohol use: No  . Drug use: No  . Sexual activity: Not on file  Lifestyle  . Physical activity    Days per week: Not on file    Minutes per session: Not on file  . Stress: Not on file  Relationships  . Social Herbalist on phone: Not on file    Gets together: Not on file    Attends religious service: Not on file    Active member of club or organization: Not on file    Attends meetings of clubs or organizations: Not on file    Relationship status: Not on file  Other Topics Concern  . Not on file  Social History Narrative   Lives with wife in a one story home.  Has one daughter.  On disability.  Education: 12th     Family History: The patient's family history includes Colon polyps in his brother. There is no history of Colon cancer, Rectal cancer, Stomach cancer, or Esophageal cancer.  ROS:   Please see the history of present illness.    All other systems reviewed and are negative.  EKGs/Labs/Other Studies Reviewed:    The following studies were reviewed today: Study Highlights   Nuclear stress EF: 31%.   There was no ST segment deviation noted during stress.  Defect 1: There is a small, fixed defect of moderate severity present in the mid anteroseptal and apex location.  This is a high risk study due to reduced systolic function.  The left ventricular ejection fraction is moderately decreased (30-44%).  There is no ischemia.      Recent Labs: 12/03/2018: ALT 21; BUN  23; Creatinine, Ser 1.08; Hemoglobin 16.1; Platelets 232; Potassium 4.2; Sodium 141  Recent Lipid Panel    Component Value Date/Time   CHOL 164 12/29/2015 1720   TRIG 226 (H) 12/29/2015 1720   HDL 27 (L) 12/29/2015 1720   CHOLHDL 6.1 (H) 12/29/2015 1720   CHOLHDL 6.7 08/22/2010 0650   VLDL 49 (H) 08/22/2010 0650   LDLCALC 92 12/29/2015 1720    Physical Exam:    VS:  BP 134/76 (BP Location: Left Arm, Patient Position: Sitting, Cuff Size: Normal)   Pulse 66   Ht 6' (1.829 m)   Wt 203 lb (92.1 kg)   SpO2 98%   BMI 27.53 kg/m     Wt Readings from Last 3 Encounters:  12/24/18 203 lb (92.1 kg)  12/03/18 210 lb (95.3 kg)  03/23/18 215 lb (97.5 kg)     GEN: Patient is in no acute distress HEENT: Normal NECK: No JVD; No carotid bruits LYMPHATICS: No lymphadenopathy CARDIAC: Hear sounds regular, 2/6 systolic murmur at the apex. RESPIRATORY:  Clear to auscultation without rales, wheezing or rhonchi  ABDOMEN: Soft, non-tender, non-distended MUSCULOSKELETAL:  No edema; No deformity  SKIN: Warm and dry NEUROLOGIC:  Alert and oriented x 3 PSYCHIATRIC:  Normal affect   Signed, Jenean Lindau, MD  12/24/2018 11:15 AM    [place]

## 2018-12-24 NOTE — Patient Instructions (Signed)

## 2019-01-02 ENCOUNTER — Telehealth: Payer: Self-pay | Admitting: Family Medicine

## 2019-01-02 NOTE — Telephone Encounter (Signed)
Patient advised via voicemail that shingles vaccine must be given alone but that we can give the flu and pneumonia vaccine together.

## 2019-01-04 ENCOUNTER — Telehealth: Payer: Self-pay | Admitting: Cardiology

## 2019-01-04 NOTE — Telephone Encounter (Signed)
°*  STAT* If patient is at the pharmacy, call can be transferred to refill team.   1. Which medications need to be refilled? (please list name of each medication and dose if known) furosemide 20mg   2. Which pharmacy/location (including street and city if local pharmacy) is medication to be sent to?[facility] [place]  3. Do they need a 30 day or 90 day supply? [place]

## 2019-01-04 NOTE — Telephone Encounter (Signed)
Left messages on home and cell phone numbers requesting clarification on furosemide refill request received, medication not noted in his chart.

## 2019-01-04 NOTE — Telephone Encounter (Signed)
Received refill request for furosemide. Medication not seen in his chart. Left vm requesting return call to clarify.

## 2019-01-07 NOTE — Telephone Encounter (Signed)
Left voice message requesting return call to verify medication refill request received.

## 2019-01-14 ENCOUNTER — Ambulatory Visit (INDEPENDENT_AMBULATORY_CARE_PROVIDER_SITE_OTHER): Payer: Medicare Other

## 2019-01-14 ENCOUNTER — Other Ambulatory Visit: Payer: Self-pay

## 2019-01-14 DIAGNOSIS — Z23 Encounter for immunization: Secondary | ICD-10-CM | POA: Diagnosis not present

## 2019-03-20 DIAGNOSIS — R251 Tremor, unspecified: Secondary | ICD-10-CM | POA: Diagnosis not present

## 2019-03-20 DIAGNOSIS — Z79899 Other long term (current) drug therapy: Secondary | ICD-10-CM | POA: Diagnosis not present

## 2019-03-20 DIAGNOSIS — G20 Parkinson's disease: Secondary | ICD-10-CM | POA: Diagnosis not present

## 2019-05-01 ENCOUNTER — Telehealth: Payer: Self-pay | Admitting: Family Medicine

## 2019-05-01 NOTE — Telephone Encounter (Signed)
Appointment made with [place] just for CPE because patient not comfortable with male for this.

## 2019-05-02 ENCOUNTER — Other Ambulatory Visit: Payer: Self-pay

## 2019-05-02 ENCOUNTER — Ambulatory Visit (INDEPENDENT_AMBULATORY_CARE_PROVIDER_SITE_OTHER): Payer: Medicare Other | Admitting: Family Medicine

## 2019-05-02 ENCOUNTER — Encounter: Payer: Self-pay | Admitting: Family Medicine

## 2019-05-02 VITALS — BP 140/77 | HR 54 | Temp 98.7°F | Ht 72.0 in | Wt 206.0 lb

## 2019-05-02 DIAGNOSIS — Z23 Encounter for immunization: Secondary | ICD-10-CM

## 2019-05-02 DIAGNOSIS — Z0000 Encounter for general adult medical examination without abnormal findings: Secondary | ICD-10-CM

## 2019-05-02 DIAGNOSIS — Z0001 Encounter for general adult medical examination with abnormal findings: Secondary | ICD-10-CM | POA: Diagnosis not present

## 2019-05-02 DIAGNOSIS — Z114 Encounter for screening for human immunodeficiency virus [HIV]: Secondary | ICD-10-CM

## 2019-05-02 DIAGNOSIS — R351 Nocturia: Secondary | ICD-10-CM | POA: Diagnosis not present

## 2019-05-02 DIAGNOSIS — Z1159 Encounter for screening for other viral diseases: Secondary | ICD-10-CM

## 2019-05-02 DIAGNOSIS — Z125 Encounter for screening for malignant neoplasm of prostate: Secondary | ICD-10-CM

## 2019-05-02 DIAGNOSIS — Z136 Encounter for screening for cardiovascular disorders: Secondary | ICD-10-CM | POA: Diagnosis not present

## 2019-05-02 MED ORDER — TAMSULOSIN HCL 0.4 MG PO CAPS: 0.4000 mg | ORAL_CAPSULE | Freq: Every day | ORAL | 1 refills | Status: DC

## 2019-05-02 NOTE — Progress Notes (Signed)
BP 140/77   Pulse (!) 54   Temp 98.7 F (37.1 C)   Ht 6' (1.829 m)   Wt 206 lb (93.4 kg)   SpO2 99%   BMI 27.94 kg/m    Subjective:   Patient ID: John Norton, male    DOB: [birth year], 64 y.o.   MRN: [id number]  HPI: John Norton is a 64 y.o. male presenting on 05/02/2019 for Annual Exam (CPE)   HPI Annual exam Patient is coming in for adult well exam and physical.  He says the only real issue that he has currently that he complains of his urinary frequency and hesitancy that has been increasing over the past couple years.  He also wakes up multiple times at night to go and he does feel like he has some flow problems and is concerned about prostate.  He does have BPH and his family.  Patient does have chronic back pain but it has been improving for him.  Patient does have Parkinson's and sees a neurologist.  Patient does have left bundle branch block and A. fib and sees a cardiologist.  Relevant past medical, surgical, family and social history reviewed and updated as indicated. Interim medical history since our last visit reviewed. Allergies and medications reviewed and updated.  Review of Systems  Constitutional: Negative for chills and fever.  HENT: Negative for ear pain and tinnitus.   Eyes: Negative for pain and visual disturbance.  Respiratory: Negative for cough, shortness of breath and wheezing.   Cardiovascular: Negative for chest pain, palpitations and leg swelling.  Gastrointestinal: Negative for abdominal pain, blood in stool, constipation and diarrhea.  Genitourinary: Positive for difficulty urinating and frequency. Negative for decreased urine volume, dysuria, flank pain, hematuria and urgency.  Musculoskeletal: Positive for gait problem. Negative for back pain and myalgias.  Skin: Negative for rash.  Neurological: Positive for weakness. Negative for dizziness and headaches.  Psychiatric/Behavioral: Negative for suicidal ideas.  All other systems reviewed  and are negative.   Per HPI unless specifically indicated above   Allergies as of 05/02/2019      Reactions   Codeine Nausea Only      Medication List       Accurate as of May 02, 2019 10:56 AM. If you have any questions, ask your nurse or doctor.        STOP taking these medications   benzonatate 100 MG capsule Commonly known as: TESSALON Stopped by: Worthy Rancher, MD   guaiFENesin-codeine 100-10 MG/5ML syrup Commonly known as: ROBITUSSIN AC Stopped by: Worthy Rancher, MD   Mucinex Sinus-Max Day/Night Liquid Misc Generic drug: PE-GG-APAP & PE-DPH-APAP Stopped by: Fransisca Kaufmann Lajuan Kovaleski, MD     TAKE these medications   amantadine 100 MG capsule Commonly known as: SYMMETREL Take 100 mg by mouth 3 (three) times daily.   aspirin EC 81 MG tablet Take 81 mg by mouth daily.   carbidopa-levodopa 25-100 MG tablet Commonly known as: SINEMET IR Take 1.5 tablets by mouth 6 (six) times daily.   ibuprofen 200 MG tablet Commonly known as: ADVIL Take 3 tablets (600 mg total) by mouth every 8 (eight) hours as needed for headache or mild pain. Resume 5 days post-op as needed   Multi-Vitamins Tabs Take 1 tablet by mouth daily.   pregabalin 200 MG capsule Commonly known as: Lyrica Take 1 capsule (200 mg total) by mouth 2 (two) times daily. What changed: when to take this   propranolol ER 60 MG 24  hr capsule Commonly known as: INDERAL LA Take 60 mg by mouth daily.   selegiline 5 MG capsule Commonly known as: ELDEPRYL Take 1 capsule by mouth 2 (two) times daily.   sildenafil 100 MG tablet Commonly known as: VIAGRA Take 100 mg by mouth daily as needed for erectile dysfunction.   tamsulosin 0.4 MG Caps capsule Commonly known as: FLOMAX Take 1 capsule (0.4 mg total) by mouth daily. Started by: Worthy Rancher, MD   vitamin C 100 MG tablet Take 100 mg by mouth daily.        Objective:   BP 140/77   Pulse (!) 54   Temp 98.7 F (37.1 C)   Ht 6' (1.829  m)   Wt 206 lb (93.4 kg)   SpO2 99%   BMI 27.94 kg/m   Wt Readings from Last 3 Encounters:  05/02/19 206 lb (93.4 kg)  12/24/18 203 lb (92.1 kg)  12/03/18 210 lb (95.3 kg)    Physical Exam Vitals reviewed.  Constitutional:      General: He is not in acute distress.    Appearance: He is well-developed. He is not diaphoretic.  HENT:     Right Ear: External ear normal.     Left Ear: External ear normal.     Nose: Nose normal.     Mouth/Throat:     Pharynx: No oropharyngeal exudate.  Eyes:     General: No scleral icterus.       Right eye: No discharge.     Conjunctiva/sclera: Conjunctivae normal.     Pupils: Pupils are equal, round, and reactive to light.  Neck:     Thyroid: No thyromegaly.  Cardiovascular:     Rate and Rhythm: Normal rate and regular rhythm.     Heart sounds: Normal heart sounds. No murmur.  Pulmonary:     Effort: Pulmonary effort is normal. No respiratory distress.     Breath sounds: Normal breath sounds. No wheezing.  Abdominal:     General: Bowel sounds are normal. There is no distension.     Palpations: Abdomen is soft.     Tenderness: There is no abdominal tenderness. There is no guarding or rebound.  Genitourinary:    Prostate: Enlarged. Not tender and no nodules present.  Musculoskeletal:        General: Normal range of motion.     Cervical back: Neck supple.  Lymphadenopathy:     Cervical: No cervical adenopathy.  Skin:    General: Skin is warm and dry.     Findings: No rash.  Neurological:     Mental Status: He is alert and oriented to person, place, and time.     Coordination: Coordination normal.  Psychiatric:        Behavior: Behavior normal.       Assessment & Plan:   Problem List Items Addressed This Visit    None    Visit Diagnoses    Well adult exam    -  Primary   Relevant Orders   CBC with Differential/Platelet   CMP14+EGFR   Lipid panel   Prostate cancer screening       Relevant Orders   PSA, total and free    Nocturia       Relevant Medications   tamsulosin (FLOMAX) 0.4 MG CAPS capsule   Other Relevant Orders   PSA, total and free   Need for hepatitis C screening test       Relevant Orders   Hepatitis C antibody  Screening for HIV without presence of risk factors       Relevant Orders   HIV Antibody (routine testing w rflx)      Will start Flomax for prostate issues, will check PSA.  Continue other medication, no changes.  Will check blood work. Follow up plan: Return in about 1 year (around 05/01/2020), or if symptoms worsen or fail to improve.  Counseling provided for all of the vaccine components Orders Placed This Encounter  Procedures  . CBC with Differential/Platelet  . CMP14+EGFR  . Lipid panel  . PSA, total and free  . HIV Antibody (routine testing w rflx)  . Hepatitis C antibody    Caryl Pina, MD [facility] 05/02/2019, 10:56 AM

## 2019-05-02 NOTE — Addendum Note (Signed)
Addended by: Alphonzo Dublin on: 05/02/2019 12:12 PM   Modules accepted: Orders

## 2019-05-03 LAB — CMP14+EGFR
ALT: 7 IU/L (ref 0–44)
AST: 21 IU/L (ref 0–40)
Albumin/Globulin Ratio: 2.2 (ref 1.2–2.2)
Albumin: 4.6 g/dL (ref 3.8–4.8)
Alkaline Phosphatase: 100 IU/L (ref 39–117)
BUN/Creatinine Ratio: 18 (ref 10–24)
BUN: 20 mg/dL (ref 8–27)
Bilirubin Total: 1 mg/dL (ref 0.0–1.2)
CO2: 25 mmol/L (ref 20–29)
Calcium: 9.6 mg/dL (ref 8.6–10.2)
Chloride: 105 mmol/L (ref 96–106)
Creatinine, Ser: 1.11 mg/dL (ref 0.76–1.27)
GFR calc Af Amer: 82 mL/min/{1.73_m2} (ref >59)
GFR calc non Af Amer: 71 mL/min/{1.73_m2} (ref >59)
Globulin, Total: 2.1 g/dL (ref 1.5–4.5)
Glucose: 129 mg/dL — ABNORMAL HIGH (ref 65–99)
Potassium: 4.5 mmol/L (ref 3.5–5.2)
Sodium: 144 mmol/L (ref 134–144)
Total Protein: 6.7 g/dL (ref 6.0–8.5)

## 2019-05-03 LAB — CBC WITH DIFFERENTIAL/PLATELET
Basophils Absolute: 0.1 10*3/uL (ref 0.0–0.2)
Basos: 1 %
EOS (ABSOLUTE): 0.1 10*3/uL (ref 0.0–0.4)
Eos: 2 %
Hematocrit: 45.6 % (ref 37.5–51.0)
Hemoglobin: 15.8 g/dL (ref 13.0–17.7)
Immature Grans (Abs): 0 10*3/uL (ref 0.0–0.1)
Immature Granulocytes: 0 %
Lymphocytes Absolute: 1.8 10*3/uL (ref 0.7–3.1)
Lymphs: 34 %
MCH: 30.9 pg (ref 26.6–33.0)
MCHC: 34.6 g/dL (ref 31.5–35.7)
MCV: 89 fL (ref 79–97)
Monocytes Absolute: 0.5 10*3/uL (ref 0.1–0.9)
Monocytes: 9 %
Neutrophils Absolute: 2.9 10*3/uL (ref 1.4–7.0)
Neutrophils: 54 %
Platelets: 201 10*3/uL (ref 150–450)
RBC: 5.12 x10E6/uL (ref 4.14–5.80)
RDW: 13.1 % (ref 11.6–15.4)
WBC: 5.3 10*3/uL (ref 3.4–10.8)

## 2019-05-03 LAB — LIPID PANEL
Chol/HDL Ratio: 4.9 ratio (ref 0.0–5.0)
Cholesterol, Total: 163 mg/dL (ref 100–199)
HDL: 33 mg/dL — ABNORMAL LOW (ref >39)
LDL Chol Calc (NIH): 104 mg/dL — ABNORMAL HIGH (ref 0–99)
Triglycerides: 143 mg/dL (ref 0–149)
VLDL Cholesterol Cal: 26 mg/dL (ref 5–40)

## 2019-05-03 LAB — PSA, TOTAL AND FREE
PSA, Free Pct: 22.1 %
PSA, Free: 0.31 ng/mL
Prostate Specific Ag, Serum: 1.4 ng/mL (ref 0.0–4.0)

## 2019-05-03 LAB — HEPATITIS C ANTIBODY: Hep C Virus Ab: <0.1 s/co ratio (ref 0.0–0.9)

## 2019-05-03 LAB — HIV ANTIBODY (ROUTINE TESTING W REFLEX): HIV Screen 4th Generation wRfx: NONREACTIVE

## 2019-05-22 ENCOUNTER — Ambulatory Visit (INDEPENDENT_AMBULATORY_CARE_PROVIDER_SITE_OTHER): Payer: Medicare Other | Admitting: *Deleted

## 2019-05-22 DIAGNOSIS — Z0000 Encounter for general adult medical examination without abnormal findings: Secondary | ICD-10-CM | POA: Diagnosis not present

## 2019-05-22 NOTE — Progress Notes (Signed)
MEDICARE ANNUAL WELLNESS VISIT  05/22/2019  Telephone Visit Disclaimer This Medicare AWV was conducted by telephone due to national recommendations for restrictions regarding the COVID-19 Pandemic (e.g. social distancing).  I verified, using two identifiers, that I am speaking with John Norton or their authorized healthcare agent. I discussed the limitations, risks, security, and privacy concerns of performing an evaluation and management service by telephone and the potential availability of an in-person appointment in the future. The patient expressed understanding and agreed to proceed.   Subjective:  John Norton is a 64 y.o. male patient of Rakes, Connye Burkitt, [place] who had a Medicare Annual Wellness Visit today via telephone. John Norton is disabled and lives with his wife John Norton. He has 1 daughter that he sees regularly. He reports that he is socially active and does interact with friends/family regularly. He is not physically active and enjoys hunting, fishing, riding four wheelers and playing with his grandchildren.  Patient Care Team: Baruch Gouty, FNP as PCP - General (Family Medicine)  Advanced Directives 05/22/2019 12/03/2018 11/03/2016 10/28/2016 06/21/2016 04/20/2012 04/17/2012  Does Patient Have a Medical Advance Directive? No No No No No Patient does not have advance directive;Patient would not like information Patient does not have advance directive;Patient would not like information  Would patient like information on creating a medical advance directive? No - Patient declined - No - Patient declined No - Patient declined - - [facility] Utilization Over the Past 12 Months: # of hospitalizations or ER visits: 1 # of surgeries: 0  Review of Systems    Patient reports that his overall health is worse compared to last year.  History obtained from the patient.  Patient Reported Readings (BP, Pulse, CBG, Weight, etc) none  Pain Assessment Pain : No/denies pain      Current Medications & Allergies (verified) Allergies as of 05/22/2019      Reactions   Codeine Nausea Only      Medication List       Accurate as of May 22, 2019 10:50 AM. If you have any questions, ask your nurse or doctor.        amantadine 100 MG capsule Commonly known as: SYMMETREL Take 100 mg by mouth 3 (three) times daily.   aspirin EC 81 MG tablet Take 81 mg by mouth daily.   carbidopa-levodopa 25-100 MG tablet Commonly known as: SINEMET IR Take 1.5 tablets by mouth 6 (six) times daily.   ibuprofen 200 MG tablet Commonly known as: ADVIL Take 3 tablets (600 mg total) by mouth every 8 (eight) hours as needed for headache or mild pain. Resume 5 days post-op as needed   Multi-Vitamins Tabs Take 1 tablet by mouth daily.   pregabalin 200 MG capsule Commonly known as: Lyrica Take 1 capsule (200 mg total) by mouth 2 (two) times daily. What changed: when to take this   propranolol ER 60 MG 24 hr capsule Commonly known as: INDERAL LA Take 60 mg by mouth daily.   selegiline 5 MG capsule Commonly known as: ELDEPRYL Take 1 capsule by mouth 2 (two) times daily.   sildenafil 100 MG tablet Commonly known as: VIAGRA Take 100 mg by mouth daily as needed for erectile dysfunction.   tamsulosin 0.4 MG Caps capsule Commonly known as: FLOMAX Take 1 capsule (0.4 mg total) by mouth daily.   vitamin C 100 MG tablet Take 100 mg by mouth daily.       History (reviewed): Past Medical  History:  Diagnosis Date  . ED (erectile dysfunction)   . History of kidney stones YRS AGO  . LBBB (left bundle branch block)   . Neuropathy    FOREHEAD BURNING AND NUMBNESS  . Parkinson's disease   . Plantar fasciitis 2017   IMPROVED  . Sleep apnea    does not use cpap, NO SLEEP APNEA SINCE QUIT SMOKING 2012  . Spinal stenosis   . Tremors of nervous system    Past Surgical History:  Procedure Laterality Date  . "spot on lung"  SEVERAL YRS AGO   SPOT [place]  . CHEST X-RAY   08/21/10   No cardiopulmonary process noted.  . CHOLECYSTECTOMY N/A 04/20/2012   Procedure: LAPAROSCOPIC CHOLECYSTECTOMY;  Surgeon: Jamesetta So, MD;  Location: [facility];  Service: General;  Laterality: N/A;  . [place]  . LEXISCAN MYOVIEW  08/22/10    There is no stress-induced ischemia,  but a fixed deficit in the lateral apex and inferior wall.  Ejection fraction 51%  . LUMBAR LAMINECTOMY/DECOMPRESSION MICRODISCECTOMY N/A 11/03/2016   Procedure: Microlumbar Decompression L4-5, L5-S1;  Surgeon: Susa Day, MD;  Location: [facility];  Service: Orthopedics;  Laterality: N/A;  120 mins  . THYROID LOBECTOMY     RIGHT THYROID   Family History  Problem Relation Age of Onset  . Colon polyps Brother   . Hypertension Brother   . Diabetes Mother   . Hypertension Mother   . Glaucoma Mother   . Hypertension Daughter   . Colon cancer Neg Hx   . Rectal cancer Neg Hx   . Stomach cancer Neg Hx   . Esophageal cancer Neg Hx    Social History   Socioeconomic History  . Marital status: Married    Spouse name: John Norton  . Number of children: 1  . Years of education: 73  . Highest education level: 12th grade  Occupational History  . Occupation: disability  Tobacco Use  . Smoking status: Former Smoker    Packs/day: 1.50    Years: 40.00    Pack years: 60.00    Types: Cigarettes    Quit date: 07/30/2010    Years since quitting: 8.8  . Smokeless tobacco: Current User    Types: Chew  Substance and Sexual Activity  . Alcohol use: No  . Drug use: No  . Sexual activity: Not on file  Other Topics Concern  . Not on file  Social History Narrative   Lives with wife in a one story home.  Has one daughter.  On disability.  Education: 12th   Social Determinants of Health   Financial Resource Strain:   . Difficulty of Paying Living Expenses:   Food Insecurity:   . Worried About Charity fundraiser in the Last Year:   . Arboriculturist in the Last Year:   Transportation Needs:   .  Film/video editor (Medical):   Marland Kitchen Lack of Transportation (Non-Medical):   Physical Activity:   . Days of Exercise per Week:   . Minutes of Exercise per Session:   Stress:   . Feeling of Stress :   Social Connections:   . Frequency of Communication with Friends and Family:   . Frequency of Social Gatherings with Friends and Family:   . Attends Religious Services:   . Active Member of Clubs or Organizations:   . Attends Archivist Meetings:   Marland Kitchen Marital Status:     Activities of Daily Living  In your present state of health, do you have any difficulty performing the following activities: 05/22/2019  Hearing? Y  Comment a little difficulty  Vision? N  Difficulty concentrating or making decisions? Y  Comment some difficulty with memory  Walking or climbing stairs? N  Dressing or bathing? Y  Comment difficulty with buttons  Doing errands, shopping? N  Preparing Food and eating ? N  Using the Toilet? N  In the past six months, have you accidently leaked urine? N  Do you have problems with loss of bowel control? N  Managing your Medications? N  Managing your Finances? N  Housekeeping or managing your Housekeeping? N  Some recent data might be hidden    Patient Education/ Literacy How often do you need to have someone help you when you read instructions, pamphlets, or other written materials from your doctor or pharmacy?: 1 - Never What is the last grade level you completed in school?: 12  Exercise Current Exercise Habits: The patient does not participate in regular exercise at present, Exercise limited by: orthopedic condition(s)(back pain)  Diet Patient reports consuming 2 meals a day and 1 snack(s) a day Patient reports that his primary diet is: Regular Patient reports that she does have regular access to food.   Depression Screen PHQ 2/9 Scores 05/22/2019 05/02/2019 03/15/2018 10/06/2016 06/25/2016 03/15/2016 03/04/2016  PHQ - 2 Score 0 0 2 0 0 0 1  PHQ- 9 Score - - 5  - - - -     Fall Risk Fall Risk  05/22/2019 05/02/2019 10/06/2016 06/25/2016 05/06/2016  Falls in the past year? 0 0 No No No  Number falls in past yr: - - - - -  Injury with Fall? - - - - -     Objective:  [place] seemed alert and oriented and he participated appropriately during our telephone visit.  Blood Pressure Weight BMI  BP Readings from Last 3 Encounters:  05/02/19 140/77  12/24/18 134/76  12/03/18 (!) 142/83   Wt Readings from Last 3 Encounters:  05/02/19 206 lb (93.4 kg)  12/24/18 203 lb (92.1 kg)  12/03/18 210 lb (95.3 kg)   BMI Readings from Last 1 Encounters:  05/02/19 27.94 kg/m    *Unable to obtain current vital signs, weight, and BMI due to telephone visit type  Hearing/Vision  . [place] did not seem to have difficulty with hearing/understanding during the telephone conversation . Reports that he has not had a formal eye exam by an eye care professional within the past year . Reports that he has not had a formal hearing evaluation within the past year *Unable to fully assess hearing and vision during telephone visit type  Cognitive Function: 6CIT Screen 05/22/2019  What Year? 0 points  What month? 0 points  What time? 0 points  Count back from 20 0 points  Months in reverse 0 points  Repeat phrase 0 points  Total Score 0   (Normal:0-7, Significant for Dysfunction: >8)  Normal Cognitive Function Screening: Yes   Immunization & Health Maintenance Record Immunization History  Administered Date(s) Administered  . Influenza,inj,Quad PF,6+ Mos 01/14/2019  . Influenza-Unspecified 01/14/2013, 01/19/2015  . Pneumococcal Conjugate-13 01/14/2019  . Zoster Recombinat (Shingrix) 05/02/2019    Health Maintenance  Topic Date Due  . COLONOSCOPY  05/01/2020 (Originally 06/27/2016)  . TETANUS/TDAP  05/01/2020 (Originally 05/28/1975)  . INFLUENZA VACCINE  Completed  . Hepatitis C Screening  Completed  . HIV Screening  Completed  Assessment  This is  a routine wellness examination for [employer]  Health Maintenance: Due or Overdue There are no preventive care reminders to display for this patient.  John Norton does not need a referral for Community Assistance: Care Management:   no Social Work:    no Prescription Assistance:  no Nutrition/Diabetes Education:  no   Plan:  Personalized Goals Goals Addressed            This Visit's Progress   . Patient Stated       05/22/2019 AWV Goal: Exercise for General Health   Patient will verbalize understanding of the benefits of increased physical activity:  Exercising regularly is important. It will improve your overall fitness, flexibility, and endurance.  Regular exercise also will improve your overall health. It can help you control your weight, reduce stress, and improve your bone density.  Over the next year, patient will increase physical activity as tolerated with a goal of at least 150 minutes of moderate physical activity per week.   You can tell that you are exercising at a moderate intensity if your heart starts beating faster and you start breathing faster but can still hold a conversation.  Moderate-intensity exercise ideas include:  Walking 1 mile (1.6 km) in about 15 minutes  Biking  Hiking  Golfing  Dancing  Water aerobics  Patient will verbalize understanding of everyday activities that increase physical activity by providing examples like the following: ? Yard work, such as: ? Pushing a Conservation officer, nature ? Raking and bagging leaves ? Washing your car ? Pushing a stroller ? Shoveling snow ? Gardening ? Washing windows or floors  Patient will be able to explain general safety guidelines for exercising:   Before you start a new exercise program, talk with your health care provider.  Do not exercise so much that you hurt yourself, feel dizzy, or get very short of breath.  Wear comfortable clothes and wear shoes with good support.  Drink  plenty of water while you exercise to prevent dehydration or heat stroke.  Work out until your breathing and your heartbeat get faster.       Personalized Health Maintenance & Screening Recommendations    Lung Cancer Screening Recommended: yes (Low Dose CT Chest recommended if Age 69-80 years, 30 pack-year currently smoking OR have quit w/in past 15 years) Hepatitis C Screening recommended: no HIV Screening recommended: no  Advanced Directives: Written information was not prepared per patient's request.  Referrals & Orders No orders of the defined types were placed in this encounter.   Follow-up Plan . Follow-up with Baruch Gouty, FNP as planned   I have personally reviewed and noted the following in the patient's chart:   . Medical and social history . Use of alcohol, tobacco or illicit drugs  . Current medications and supplements . Functional ability and status . Nutritional status . Physical activity . Advanced directives . List of other physicians . Hospitalizations, surgeries, and ER visits in previous 12 months . Vitals . Screenings to include cognitive, depression, and falls . Referrals and appointments  In addition, I have reviewed and discussed with Arty Baumgartner Pareja certain preventive protocols, quality metrics, and best practice recommendations. A written personalized care plan for preventive services as well as general preventive health recommendations is available and can be mailed to the patient at his request.      Baldomero Lamy, LPN [phone]

## 2019-06-06 ENCOUNTER — Ambulatory Visit: Payer: Medicare Other | Admitting: Cardiology

## 2019-06-12 ENCOUNTER — Telehealth: Payer: Self-pay | Admitting: Gastroenterology

## 2019-06-12 NOTE — Telephone Encounter (Signed)
.  A user error has taken place: error

## 2019-07-01 ENCOUNTER — Encounter: Payer: Self-pay | Admitting: Cardiology

## 2019-07-01 ENCOUNTER — Other Ambulatory Visit: Payer: Self-pay

## 2019-07-01 ENCOUNTER — Ambulatory Visit: Payer: Medicare Other | Admitting: Cardiology

## 2019-07-01 VITALS — BP 156/90 | HR 56 | Ht 72.0 in | Wt 209.0 lb

## 2019-07-01 DIAGNOSIS — G250 Essential tremor: Secondary | ICD-10-CM

## 2019-07-01 DIAGNOSIS — I480 Paroxysmal atrial fibrillation: Secondary | ICD-10-CM | POA: Diagnosis not present

## 2019-07-01 DIAGNOSIS — G20 Parkinson's disease: Secondary | ICD-10-CM

## 2019-07-01 NOTE — Patient Instructions (Signed)

## 2019-07-01 NOTE — Progress Notes (Signed)
Cardiology Office Note:    Date:  07/01/2019   ID:  ZAHMARI MILKEY, DOB 06-30-1955, MRN [id number]  PCP:  Baruch Gouty, FNP  Cardiologist:  Jenean Lindau, MD   Referring MD: Baruch Gouty, FNP    ASSESSMENT:    1. Paroxysmal atrial fibrillation (HCC)   2. Essential tremor   3. Parkinson's disease ([place])    PLAN:    In order of problems listed above:  1. Primary prevention stressed to the patient.  Importance of compliance with diet and medication stressed and he vocalized understanding.  His Parkinson's is stable and managed by his primary care physician. 2. Paroxysmal atrial fibrillation: His CHADS2 score is 0.  He denies any symptoms he does not feel any palpitations I reassured him about my findings.  He will continue his medications beta-blocker for his essential tremor as it is helping him. 3. Essential tremor: On beta-blocker managed by primary care physician.  Stable. 4. Patient will be seen in follow-up appointment in 6 months or earlier if the patient has any concerns    Medication Adjustments/Labs and Tests Ordered: Current medicines are reviewed at length with the patient today.  Concerns regarding medicines are outlined above.  No orders of the defined types were placed in this encounter.  No orders of the defined types were placed in this encounter.    Chief Complaint  Patient presents with  . Follow-up    6 Months     History of Present Illness:    John Norton is a 64 y.o. male.  Patient has past medical history of paroxysmal atrial fibrillation.  He denies any problems at this time and takes care of activities of daily living.  No chest pain orthopnea or PND.  He ambulates not optimally and that is because of his balance issues with Parkinson's.  At the time of my evaluation, the patient is alert awake oriented and in no distress.  Past Medical History:  Diagnosis Date  . ED (erectile dysfunction)   . History of kidney stones YRS AGO  . LBBB  (left bundle branch block)   . Neuropathy    FOREHEAD BURNING AND NUMBNESS  . Parkinson's disease   . Plantar fasciitis 2017   IMPROVED  . Sleep apnea    does not use cpap, NO SLEEP APNEA SINCE QUIT SMOKING 2012  . Spinal stenosis   . Tremors of nervous system     Past Surgical History:  Procedure Laterality Date  . "spot on lung"  SEVERAL YRS AGO   SPOT [place]  . CHEST X-RAY  08/21/10   No cardiopulmonary process noted.  . CHOLECYSTECTOMY N/A 04/20/2012   Procedure: LAPAROSCOPIC CHOLECYSTECTOMY;  Surgeon: Jamesetta So, MD;  Location: [facility];  Service: General;  Laterality: N/A;  . [place]  . LEXISCAN MYOVIEW  08/22/10    There is no stress-induced ischemia,  but a fixed deficit in the lateral apex and inferior wall.  Ejection fraction 51%  . LUMBAR LAMINECTOMY/DECOMPRESSION MICRODISCECTOMY N/A 11/03/2016   Procedure: Microlumbar Decompression L4-5, L5-S1;  Surgeon: Susa Day, MD;  Location: [facility];  Service: Orthopedics;  Laterality: N/A;  120 mins  . THYROID LOBECTOMY     RIGHT THYROID    Current Medications: Current Meds  Medication Sig  . amantadine (SYMMETREL) 100 MG capsule Take 100 mg by mouth 3 (three) times daily.  . Ascorbic Acid (VITAMIN C) 100 MG tablet Take 100 mg by mouth daily.  Marland Kitchen  aspirin EC 81 MG tablet Take 81 mg by mouth daily.   . carbidopa-levodopa (SINEMET) 25-100 MG per tablet Take 1.5 tablets by mouth 6 (six) times daily.   Marland Kitchen ibuprofen (ADVIL,MOTRIN) 200 MG tablet Take 3 tablets (600 mg total) by mouth every 8 (eight) hours as needed for headache or mild pain. Resume 5 days post-op as needed  . Multiple Vitamin (MULTI-VITAMINS) TABS Take 1 tablet by mouth daily.   . pregabalin (LYRICA) 200 MG capsule Take 1 capsule (200 mg total) by mouth 2 (two) times daily. (Patient taking differently: Take 200 mg by mouth 3 (three) times daily. )  . propranolol ER (INDERAL LA) 60 MG 24 hr capsule Take 60 mg by mouth daily.  . selegiline  (ELDEPRYL) 5 MG capsule Take 1 capsule by mouth 2 (two) times daily.  . sildenafil (VIAGRA) 100 MG tablet Take 100 mg by mouth daily as needed for erectile dysfunction.   . tamsulosin (FLOMAX) 0.4 MG CAPS capsule Take 1 capsule (0.4 mg total) by mouth daily.     Allergies:   Codeine   Social History   Socioeconomic History  . Marital status: Married    Spouse name: Tye [place]  . Number of children: 1  . Years of education: 40  . Highest education level: 12th grade  Occupational History  . Occupation: disability  Tobacco Use  . Smoking status: Former Smoker    Packs/day: 1.50    Years: 40.00    Pack years: 60.00    Types: Cigarettes    Quit date: 07/30/2010    Years since quitting: 8.9  . Smokeless tobacco: Current User    Types: Chew  Substance and Sexual Activity  . Alcohol use: No  . Drug use: No  . Sexual activity: Not on file  Other Topics Concern  . Not on file  Social History Narrative   Lives with wife in a one story home.  Has one daughter.  On disability.  Education: 12th   Social Determinants of Health   Financial Resource Strain:   . Difficulty of Paying Living Expenses:   Food Insecurity:   . Worried About Charity fundraiser in the Last Year:   . Arboriculturist in the Last Year:   Transportation Needs:   . Film/video editor (Medical):   Marland Kitchen Lack of Transportation (Non-Medical):   Physical Activity:   . Days of Exercise per Week:   . Minutes of Exercise per Session:   Stress:   . Feeling of Stress :   Social Connections:   . Frequency of Communication with Friends and Family:   . Frequency of Social Gatherings with Friends and Family:   . Attends Religious Services:   . Active Member of Clubs or Organizations:   . Attends Archivist Meetings:   Marland Kitchen Marital Status:      Family History: The patient's family history includes Colon polyps in his brother; Diabetes in his mother; Glaucoma in his mother; Hypertension in his brother, daughter, and  mother. There is no history of Colon cancer, Rectal cancer, Stomach cancer, or Esophageal cancer.  ROS:   Please see the history of present illness.    All other systems reviewed and are negative.  EKGs/Labs/Other Studies Reviewed:    The following studies were reviewed today: I discussed my findings with the patient at length   Recent Labs: 05/02/2019: ALT 7; BUN 20; Creatinine, Ser 1.11; Hemoglobin 15.8; Platelets 201; Potassium 4.5; Sodium 144  Recent Lipid  Panel    Component Value Date/Time   CHOL 163 05/02/2019 1156   TRIG 143 05/02/2019 1156   HDL 33 (L) 05/02/2019 1156   CHOLHDL 4.9 05/02/2019 1156   CHOLHDL 6.7 08/22/2010 0650   VLDL 49 (H) 08/22/2010 0650   LDLCALC 104 (H) 05/02/2019 1156    Physical Exam:    VS:  BP (!) 156/90   Pulse (!) 56   Ht 6' (1.829 m)   Wt 209 lb (94.8 kg)   SpO2 93%   BMI 28.35 kg/m     Wt Readings from Last 3 Encounters:  07/01/19 209 lb (94.8 kg)  05/02/19 206 lb (93.4 kg)  12/24/18 203 lb (92.1 kg)     GEN: Patient is in no acute distress HEENT: Normal NECK: No JVD; No carotid bruits LYMPHATICS: No lymphadenopathy CARDIAC: Hear sounds regular, 2/6 systolic murmur at the apex. RESPIRATORY:  Clear to auscultation without rales, wheezing or rhonchi  ABDOMEN: Soft, non-tender, non-distended MUSCULOSKELETAL:  No edema; No deformity  SKIN: Warm and dry NEUROLOGIC:  Alert and oriented x 3 PSYCHIATRIC:  Normal affect   Signed, Jenean Lindau, MD  07/01/2019 2:47 PM    [facility]

## 2019-07-05 ENCOUNTER — Ambulatory Visit (INDEPENDENT_AMBULATORY_CARE_PROVIDER_SITE_OTHER): Payer: Medicare Other | Admitting: *Deleted

## 2019-07-05 ENCOUNTER — Other Ambulatory Visit: Payer: Self-pay

## 2019-07-05 DIAGNOSIS — Z23 Encounter for immunization: Secondary | ICD-10-CM

## 2019-07-11 ENCOUNTER — Ambulatory Visit (AMBULATORY_SURGERY_CENTER): Payer: Self-pay

## 2019-07-11 ENCOUNTER — Other Ambulatory Visit: Payer: Self-pay

## 2019-07-11 VITALS — Temp 96.6°F | Ht 72.0 in | Wt 209.0 lb

## 2019-07-11 DIAGNOSIS — Z01818 Encounter for other preprocedural examination: Secondary | ICD-10-CM

## 2019-07-11 DIAGNOSIS — Z86010 Personal history of colonic polyps: Secondary | ICD-10-CM

## 2019-07-11 NOTE — Progress Notes (Signed)

## 2019-07-15 ENCOUNTER — Encounter: Payer: Self-pay | Admitting: Gastroenterology

## 2019-07-22 ENCOUNTER — Ambulatory Visit (INDEPENDENT_AMBULATORY_CARE_PROVIDER_SITE_OTHER): Payer: Medicare Other

## 2019-07-22 ENCOUNTER — Other Ambulatory Visit: Payer: Self-pay | Admitting: Gastroenterology

## 2019-07-22 ENCOUNTER — Other Ambulatory Visit: Payer: Self-pay

## 2019-07-22 DIAGNOSIS — Z1159 Encounter for screening for other viral diseases: Secondary | ICD-10-CM

## 2019-07-22 LAB — SARS CORONAVIRUS 2 (TAT 6-24 HRS): SARS Coronavirus 2: NEGATIVE

## 2019-07-25 ENCOUNTER — Other Ambulatory Visit: Payer: Self-pay

## 2019-07-25 ENCOUNTER — Encounter: Payer: Self-pay | Admitting: Gastroenterology

## 2019-07-25 ENCOUNTER — Ambulatory Visit (AMBULATORY_SURGERY_CENTER): Payer: Medicare Other | Admitting: Gastroenterology

## 2019-07-25 VITALS — BP 145/74 | HR 50 | Temp 96.4°F | Resp 9 | Ht 72.0 in | Wt 209.0 lb

## 2019-07-25 DIAGNOSIS — Z86010 Personal history of colonic polyps: Secondary | ICD-10-CM

## 2019-07-25 MED ORDER — SODIUM CHLORIDE 0.9 % IV SOLN: 500.0000 mL | Freq: Once | INTRAVENOUS | Status: DC

## 2019-07-25 NOTE — Op Note (Signed)
[facility] Patient Name: John Norton Procedure Date: 07/25/2019 9:51 AM MRN: [id number] Endoscopist: Ladene Artist , MD Age: 64 Referring MD:  Date of Birth: 08-Mar-1955 Gender: Male Account #: [id number] Procedure:                Colonoscopy Indications:              Surveillance: Personal history of adenomatous                            polyps on last colonoscopy > 5 years ago Medicines:                Monitored Anesthesia Care Procedure:                Pre-Anesthesia Assessment:                           - Prior to the procedure, a History and Physical                            was performed, and patient medications and                            allergies were reviewed. The patient's tolerance of                            previous anesthesia was also reviewed. The risks                            and benefits of the procedure and the sedation                            options and risks were discussed with the patient.                            All questions were answered, and informed consent                            was obtained. Prior Anticoagulants: The patient has                            taken no previous anticoagulant or antiplatelet                            agents. ASA Grade Assessment: II - A patient with                            mild systemic disease. After reviewing the risks                            and benefits, the patient was deemed in                            satisfactory condition to undergo the procedure.  After obtaining informed consent, the colonoscope                            was passed under direct vision. Throughout the                            procedure, the patient's blood pressure, pulse, and                            oxygen saturations were monitored continuously. The                            Colonoscope was introduced through the anus and                            advanced to the the cecum,  identified by                            appendiceal orifice and ileocecal valve. The                            ileocecal valve, appendiceal orifice, and rectum                            were photographed. The quality of the bowel                            preparation was good after extensive lavage and                            suction. The colonoscopy was performed without                            difficulty. The patient tolerated the procedure                            well. Scope In: 10:09:16 AM Scope Out: 10:22:46 AM Scope Withdrawal Time: 0 hours 9 minutes 9 seconds  Total Procedure Duration: 0 hours 13 minutes 30 seconds  Findings:                 The perianal and digital rectal examinations were                            normal.                           The entire examined colon appeared normal on direct                            and retroflexion views. Complications:            No immediate complications. Estimated blood loss:                            None. Estimated Blood Loss:  Estimated blood loss: none. Impression:               - The entire examined colon is normal on direct and                            retroflexion views.                           - No specimens collected. Recommendation:           - Repeat colonoscopy in 7 years for surveillance.                           - Patient has a contact number available for                            emergencies. The signs and symptoms of potential                            delayed complications were discussed with the                            patient. Return to normal activities tomorrow.                            Written discharge instructions were provided to the                            patient.                           - Resume previous diet.                           - Continue present medications. Ladene Artist, MD 07/25/2019 10:26:26 AM This report has been signed electronically.

## 2019-07-25 NOTE — Progress Notes (Signed)
Pt's states no medical or surgical changes since previsit or office visit. 

## 2019-07-25 NOTE — Patient Instructions (Signed)
YOU HAD AN ENDOSCOPIC PROCEDURE TODAY AT THE [facility]:   Refer to the procedure report that was given to you for any specific questions about what was found during the examination.  If the procedure report does not answer your questions, please call your gastroenterologist to clarify.  If you requested that your care partner not be given the details of your procedure findings, then the procedure report has been included in a sealed envelope for you to review at your convenience later.  YOU SHOULD EXPECT: Some feelings of bloating in the abdomen. Passage of more gas than usual.  Walking can help get rid of the air that was put into your GI tract during the procedure and reduce the bloating. If you had a lower endoscopy (such as a colonoscopy or flexible sigmoidoscopy) you may notice spotting of blood in your stool or on the toilet paper. If you underwent a bowel prep for your procedure, you may not have a normal bowel movement for a few days.  Please Note:  You might notice some irritation and congestion in your nose or some drainage.  This is from the oxygen used during your procedure.  There is no need for concern and it should clear up in a day or so.  SYMPTOMS TO REPORT IMMEDIATELY:   Following lower endoscopy (colonoscopy or flexible sigmoidoscopy):  Excessive amounts of blood in the stool  Significant tenderness or worsening of abdominal pains  Swelling of the abdomen that is new, acute  Fever of 100F or higher  For urgent or emergent issues, a gastroenterologist can be reached at any hour by calling [phone]. Do not use MyChart messaging for urgent concerns.    DIET:  We do recommend a small meal at first, but then you may proceed to your regular diet.  Drink plenty of fluids but you should avoid alcoholic beverages for 24 hours.  ACTIVITY:  You should plan to take it easy for the rest of today and you should NOT DRIVE or use heavy machinery until tomorrow (because  of the sedation medicines used during the test).    FOLLOW UP: Our staff will call the number listed on your records 48-72 hours following your procedure to check on you and address any questions or concerns that you may have regarding the information given to you following your procedure. If we do not reach you, we will leave a message.  We will attempt to reach you two times.  During this call, we will ask if you have developed any symptoms of COVID 19. If you develop any symptoms (ie: fever, flu-like symptoms, shortness of breath, cough etc.) before then, please call [phone].  If you test positive for Covid 19 in the 2 weeks post procedure, please call and report this information to us.    If any biopsies were taken you will be contacted by phone or by letter within the next 1-3 weeks.  Please call us at [phone] if you have not heard about the biopsies in 3 weeks.    SIGNATURES/CONFIDENTIALITY: You and/or your care partner have signed paperwork which will be entered into your electronic medical record.  These signatures attest to the fact that that the information above on your After Visit Summary has been reviewed and is understood.  Full responsibility of the confidentiality of this discharge information lies with you and/or your care-partner. 

## 2019-07-25 NOTE — Progress Notes (Signed)
pt tolerated well. VSS. awake and to recovery. Report given to RN.  

## 2019-07-30 ENCOUNTER — Telehealth: Payer: Self-pay

## 2019-07-30 NOTE — Telephone Encounter (Signed)
  Follow up Call-  Call back number 07/25/2019  Post procedure Call Back phone  # [phone] cell ok to speak with her.  Permission to leave phone message Yes  Some recent data might be hidden     Patient questions:  Do you have a fever, pain , or abdominal swelling? No. Pain Score  0 *  Have you tolerated food without any problems? Yes.    Have you been able to return to your normal activities? Yes.    Do you have any questions about your discharge instructions: Diet   No. Medications  No. Follow up visit  No.  Do you have questions or concerns about your Care? No.  Actions: * If pain score is 4 or above: No action needed, pain <4.  1. Have you developed a fever since your procedure? no  2.   Have you had an respiratory symptoms (SOB or cough) since your procedure? no  3.   Have you tested positive for COVID 19 since your procedure no  4.   Have you had any family members/close contacts diagnosed with the COVID 19 since your procedure?  no   If yes to any of these questions please route to Joylene John, RN and Erenest Rasher, RN

## 2019-10-01 DIAGNOSIS — Z79899 Other long term (current) drug therapy: Secondary | ICD-10-CM | POA: Diagnosis not present

## 2019-10-01 DIAGNOSIS — G20 Parkinson's disease: Secondary | ICD-10-CM | POA: Diagnosis not present

## 2019-10-01 DIAGNOSIS — R251 Tremor, unspecified: Secondary | ICD-10-CM | POA: Diagnosis not present

## 2019-10-01 DIAGNOSIS — Z885 Allergy status to narcotic agent status: Secondary | ICD-10-CM | POA: Diagnosis not present

## 2019-10-01 DIAGNOSIS — M792 Neuralgia and neuritis, unspecified: Secondary | ICD-10-CM | POA: Diagnosis not present

## 2019-10-10 ENCOUNTER — Ambulatory Visit (INDEPENDENT_AMBULATORY_CARE_PROVIDER_SITE_OTHER): Payer: Medicare Other | Admitting: Family Medicine

## 2019-10-10 ENCOUNTER — Encounter: Payer: Self-pay | Admitting: Family Medicine

## 2019-10-10 DIAGNOSIS — J029 Acute pharyngitis, unspecified: Secondary | ICD-10-CM

## 2019-10-10 MED ORDER — FAMOTIDINE 20 MG PO TABS: 20.0000 mg | ORAL_TABLET | Freq: Two times a day (BID) | ORAL | 1 refills | Status: DC

## 2019-10-10 NOTE — Progress Notes (Signed)
Virtual Visit via telephone Note  I connected with John Norton on 10/10/19 at 1725 by telephone and verified that I am speaking with the correct person using two identifiers. OZZIE KNOBEL is currently located at home and wife are currently with her during visit. The provider, Fransisca Kaufmann Kyheem Bathgate, MD is located in their office at time of visit.  Call ended at 1731  I discussed the limitations, risks, security and privacy concerns of performing an evaluation and management service by telephone and the availability of in person appointments. I also discussed with the patient that there may be a patient responsible charge related to this service. The patient expressed understanding and agreed to proceed.   History and Present Illness: Patient is calling in for sore throat and scratchy throat.  This has been going on for a couple weeks. It is not bad but won't go away.  He denies any fevers or chills.  He does not have a cough. He uses salt water gargles and it helps.  It is not too bad but just annoying.  It is worse at night sometimes.  Peppermint candy helps.  He does get some heartburn.  He denies any nasal symptoms.   No diagnosis found.  Outpatient Encounter Medications as of 10/10/2019  Medication Sig  . amantadine (SYMMETREL) 100 MG capsule Take 100 mg by mouth 3 (three) times daily.  . Ascorbic Acid (VITAMIN C) 100 MG tablet Take 100 mg by mouth daily.  Marland Kitchen aspirin EC 81 MG tablet Take 81 mg by mouth daily.   . carbidopa-levodopa (SINEMET) 25-100 MG per tablet Take 1.5 tablets by mouth 6 (six) times daily.   Marland Kitchen ibuprofen (ADVIL,MOTRIN) 200 MG tablet Take 3 tablets (600 mg total) by mouth every 8 (eight) hours as needed for headache or mild pain. Resume 5 days post-op as needed  . Multiple Vitamin (MULTI-VITAMINS) TABS Take 1 tablet by mouth daily.   . pregabalin (LYRICA) 200 MG capsule Take 1 capsule (200 mg total) by mouth 2 (two) times daily. (Patient taking differently: Take 200 mg  by mouth 3 (three) times daily. )  . propranolol ER (INDERAL LA) 60 MG 24 hr capsule Take 60 mg by mouth daily.  . selegiline (ELDEPRYL) 5 MG capsule Take 1 capsule by mouth 2 (two) times daily.  . sildenafil (VIAGRA) 100 MG tablet Take 100 mg by mouth daily as needed for erectile dysfunction.   . tamsulosin (FLOMAX) 0.4 MG CAPS capsule Take 1 capsule (0.4 mg total) by mouth daily.   No facility-administered encounter medications on file as of 10/10/2019.    Review of Systems  Constitutional: Negative for chills and fever.  HENT: Positive for sore throat. Negative for congestion, ear pain, sinus pressure, sinus pain, sneezing and trouble swallowing.   Respiratory: Negative for shortness of breath and wheezing.   Cardiovascular: Negative for chest pain and leg swelling.  Musculoskeletal: Negative for back pain and gait problem.  Skin: Negative for rash.  All other systems reviewed and are negative.   Observations/Objective: Patient sounds comfortable and in no acute distress  Assessment and Plan: Problem List Items Addressed This Visit    None    Visit Diagnoses    Pharyngitis, unspecified etiology    -  Primary   Relevant Medications   famotidine (PEPCID) 20 MG tablet    likely GERD Patient thinks thrush is a possibility but he does not have any white stuff on his tongue or mouth or back of his throat  per his wife.  Follow up plan: Return if symptoms worsen or fail to improve.     I discussed the assessment and treatment plan with the patient. The patient was provided an opportunity to ask questions and all were answered. The patient agreed with the plan and demonstrated an understanding of the instructions.   The patient was advised to call back or seek an in-person evaluation if the symptoms worsen or if the condition fails to improve as anticipated.  The above assessment and management plan was discussed with the patient. The patient verbalized understanding of and has  agreed to the management plan. Patient is aware to call the clinic if symptoms persist or worsen. Patient is aware when to return to the clinic for a follow-up visit. Patient educated on when it is appropriate to go to the emergency department.    I provided 6 minutes of non-face-to-face time during this encounter.    Worthy Rancher, MD

## 2019-10-16 DIAGNOSIS — Z7982 Long term (current) use of aspirin: Secondary | ICD-10-CM | POA: Diagnosis not present

## 2019-10-16 DIAGNOSIS — G20 Parkinson's disease: Secondary | ICD-10-CM | POA: Diagnosis not present

## 2019-10-16 DIAGNOSIS — G250 Essential tremor: Secondary | ICD-10-CM | POA: Diagnosis not present

## 2019-10-16 DIAGNOSIS — R251 Tremor, unspecified: Secondary | ICD-10-CM | POA: Diagnosis not present

## 2019-10-25 ENCOUNTER — Other Ambulatory Visit: Payer: Self-pay | Admitting: Family Medicine

## 2019-10-25 DIAGNOSIS — R351 Nocturia: Secondary | ICD-10-CM

## 2019-11-03 ENCOUNTER — Other Ambulatory Visit: Payer: Self-pay | Admitting: Family Medicine

## 2019-11-03 DIAGNOSIS — J029 Acute pharyngitis, unspecified: Secondary | ICD-10-CM

## 2019-12-02 ENCOUNTER — Other Ambulatory Visit: Payer: Self-pay | Admitting: Family Medicine

## 2019-12-02 DIAGNOSIS — J029 Acute pharyngitis, unspecified: Secondary | ICD-10-CM

## 2019-12-11 DIAGNOSIS — R251 Tremor, unspecified: Secondary | ICD-10-CM | POA: Diagnosis not present

## 2019-12-11 DIAGNOSIS — G20 Parkinson's disease: Secondary | ICD-10-CM | POA: Diagnosis not present

## 2020-02-25 ENCOUNTER — Encounter: Payer: Self-pay | Admitting: *Deleted

## 2020-03-19 DIAGNOSIS — R9082 White matter disease, unspecified: Secondary | ICD-10-CM | POA: Diagnosis not present

## 2020-03-19 DIAGNOSIS — I6789 Other cerebrovascular disease: Secondary | ICD-10-CM | POA: Diagnosis not present

## 2020-03-19 DIAGNOSIS — G20 Parkinson's disease: Secondary | ICD-10-CM | POA: Diagnosis not present

## 2020-03-19 DIAGNOSIS — R490 Dysphonia: Secondary | ICD-10-CM | POA: Diagnosis not present

## 2020-03-26 DIAGNOSIS — K219 Gastro-esophageal reflux disease without esophagitis: Secondary | ICD-10-CM | POA: Diagnosis not present

## 2020-03-26 DIAGNOSIS — Z87891 Personal history of nicotine dependence: Secondary | ICD-10-CM | POA: Diagnosis not present

## 2020-03-26 DIAGNOSIS — G629 Polyneuropathy, unspecified: Secondary | ICD-10-CM | POA: Diagnosis not present

## 2020-03-26 DIAGNOSIS — G20 Parkinson's disease: Secondary | ICD-10-CM | POA: Diagnosis not present

## 2020-04-09 DIAGNOSIS — G20 Parkinson's disease: Secondary | ICD-10-CM | POA: Diagnosis not present

## 2020-04-09 DIAGNOSIS — Z4801 Encounter for change or removal of surgical wound dressing: Secondary | ICD-10-CM | POA: Diagnosis not present

## 2020-04-09 DIAGNOSIS — Z9689 Presence of other specified functional implants: Secondary | ICD-10-CM | POA: Diagnosis not present

## 2020-04-16 DIAGNOSIS — G20 Parkinson's disease: Secondary | ICD-10-CM | POA: Diagnosis not present

## 2020-04-16 DIAGNOSIS — M792 Neuralgia and neuritis, unspecified: Secondary | ICD-10-CM | POA: Diagnosis not present

## 2020-04-17 ENCOUNTER — Other Ambulatory Visit: Payer: Self-pay | Admitting: Family Medicine

## 2020-04-17 DIAGNOSIS — G20 Parkinson's disease: Secondary | ICD-10-CM | POA: Diagnosis not present

## 2020-04-17 DIAGNOSIS — M792 Neuralgia and neuritis, unspecified: Secondary | ICD-10-CM | POA: Diagnosis not present

## 2020-04-17 DIAGNOSIS — R351 Nocturia: Secondary | ICD-10-CM

## 2020-04-29 DIAGNOSIS — G20 Parkinson's disease: Secondary | ICD-10-CM | POA: Diagnosis not present

## 2020-04-29 DIAGNOSIS — Z4549 Encounter for adjustment and management of other implanted nervous system device: Secondary | ICD-10-CM | POA: Diagnosis not present

## 2020-04-29 DIAGNOSIS — Z48811 Encounter for surgical aftercare following surgery on the nervous system: Secondary | ICD-10-CM | POA: Diagnosis not present

## 2020-04-29 DIAGNOSIS — Z4802 Encounter for removal of sutures: Secondary | ICD-10-CM | POA: Diagnosis not present

## 2020-05-04 ENCOUNTER — Encounter: Payer: Medicare Other | Admitting: Family Medicine

## 2020-05-07 ENCOUNTER — Ambulatory Visit (INDEPENDENT_AMBULATORY_CARE_PROVIDER_SITE_OTHER): Payer: Medicare Other | Admitting: Family Medicine

## 2020-05-07 ENCOUNTER — Encounter: Payer: Self-pay | Admitting: Family Medicine

## 2020-05-07 ENCOUNTER — Other Ambulatory Visit: Payer: Self-pay

## 2020-05-07 VITALS — BP 123/71 | HR 64 | Ht 72.0 in | Wt 209.0 lb

## 2020-05-07 DIAGNOSIS — I447 Left bundle-branch block, unspecified: Secondary | ICD-10-CM | POA: Diagnosis not present

## 2020-05-07 DIAGNOSIS — J029 Acute pharyngitis, unspecified: Secondary | ICD-10-CM | POA: Diagnosis not present

## 2020-05-07 DIAGNOSIS — G20 Parkinson's disease: Secondary | ICD-10-CM | POA: Diagnosis not present

## 2020-05-07 DIAGNOSIS — K219 Gastro-esophageal reflux disease without esophagitis: Secondary | ICD-10-CM | POA: Insufficient documentation

## 2020-05-07 DIAGNOSIS — N400 Enlarged prostate without lower urinary tract symptoms: Secondary | ICD-10-CM | POA: Diagnosis not present

## 2020-05-07 DIAGNOSIS — G20A1 Parkinson's disease without dyskinesia, without mention of fluctuations: Secondary | ICD-10-CM

## 2020-05-07 DIAGNOSIS — Z23 Encounter for immunization: Secondary | ICD-10-CM

## 2020-05-07 DIAGNOSIS — Z13220 Encounter for screening for lipoid disorders: Secondary | ICD-10-CM | POA: Diagnosis not present

## 2020-05-07 DIAGNOSIS — Z0001 Encounter for general adult medical examination with abnormal findings: Secondary | ICD-10-CM | POA: Diagnosis not present

## 2020-05-07 HISTORY — DX: Gastro-esophageal reflux disease without esophagitis: K21.9

## 2020-05-07 HISTORY — DX: Benign prostatic hyperplasia without lower urinary tract symptoms: N40.0

## 2020-05-07 MED ORDER — TAMSULOSIN HCL 0.4 MG PO CAPS: 0.4000 mg | ORAL_CAPSULE | Freq: Every day | ORAL | 3 refills | Status: DC

## 2020-05-07 MED ORDER — FAMOTIDINE 20 MG PO TABS: 20.0000 mg | ORAL_TABLET | Freq: Two times a day (BID) | ORAL | 3 refills | Status: DC

## 2020-05-07 NOTE — Addendum Note (Signed)
Addended by: Alphonzo Dublin on: 05/07/2020 10:36 AM   Modules accepted: Orders

## 2020-05-07 NOTE — Progress Notes (Signed)
BP 123/71   Pulse 64   Ht 6' (1.829 m)   Wt 209 lb (94.8 kg)   SpO2 98%   BMI 28.35 kg/m    Subjective:   Patient ID: John Norton, male    DOB: 1955-11-24, 65 y.o.   MRN: [id number]  HPI: John Norton is a 65 y.o. male presenting on 05/07/2020 for Medical Management of Chronic Issues (CPE, No concerns)   HPI Patient has Parkinson's disease and follows up with neurology for this and has been stable, he just did deep brain stimulation yesterday to see if they can help with some of the tremors.  GERD Patient is currently on Pepcid as needed, he says he does not use it very frequently does not need it very frequently.  She denies any major symptoms or abdominal pain or belching or burping. She denies any blood in her stool or lightheadedness or dizziness.   BPH Patient is coming in for recheck on BPH Symptoms: No symptoms, nocturia is controlled per patient Medication: Flomax Last PSA:    Relevant past medical, surgical, family and social history reviewed and updated as indicated. Interim medical history since our last visit reviewed. Allergies and medications reviewed and updated.  Review of Systems  Constitutional: Negative for chills and fever.  Eyes: Negative for visual disturbance.  Respiratory: Negative for shortness of breath and wheezing.   Cardiovascular: Negative for chest pain and leg swelling.  Musculoskeletal: Negative for back pain and gait problem.  Skin: Negative for rash.  Neurological: Negative for dizziness, weakness and light-headedness.  All other systems reviewed and are negative.   Per HPI unless specifically indicated above   Allergies as of 05/07/2020      Reactions   Codeine Nausea Only      Medication List       Accurate as of May 07, 2020 10:21 AM. If you have any questions, ask your nurse or doctor.        amantadine 100 MG capsule Commonly known as: SYMMETREL Take 100 mg by mouth 3 (three) times daily.   aspirin EC 81 MG  tablet Take 81 mg by mouth daily.   carbidopa-levodopa 25-100 MG tablet Commonly known as: SINEMET IR Take 1.5 tablets by mouth 6 (six) times daily.   famotidine 20 MG tablet Commonly known as: PEPCID Take 1 tablet (20 mg total) by mouth 2 (two) times daily.   ibuprofen 200 MG tablet Commonly known as: ADVIL Take 3 tablets (600 mg total) by mouth every 8 (eight) hours as needed for headache or mild pain. Resume 5 days post-op as needed   Multi-Vitamins Tabs Take 1 tablet by mouth daily.   pregabalin 200 MG capsule Commonly known as: Lyrica Take 1 capsule (200 mg total) by mouth 2 (two) times daily. What changed: when to take this   propranolol ER 60 MG 24 hr capsule Commonly known as: INDERAL LA Take 60 mg by mouth daily.   selegiline 5 MG capsule Commonly known as: ELDEPRYL Take 1 capsule by mouth 2 (two) times daily.   sildenafil 100 MG tablet Commonly known as: VIAGRA Take 100 mg by mouth daily as needed for erectile dysfunction.   tamsulosin 0.4 MG Caps capsule Commonly known as: FLOMAX Take 1 capsule (0.4 mg total) by mouth daily.   vitamin C 100 MG tablet Take 100 mg by mouth daily.        Objective:   BP 123/71   Pulse 64   Ht 6' (1.829  m)   Wt 209 lb (94.8 kg)   SpO2 98%   BMI 28.35 kg/m   Wt Readings from Last 3 Encounters:  05/07/20 209 lb (94.8 kg)  07/25/19 209 lb (94.8 kg)  07/11/19 209 lb (94.8 kg)    Physical Exam Vitals and nursing note reviewed.  Constitutional:      General: He is not in acute distress.    Appearance: He is well-developed. He is not diaphoretic.  HENT:     Right Ear: External ear normal.     Left Ear: External ear normal.     Nose: Nose normal.     Mouth/Throat:     Pharynx: No oropharyngeal exudate.  Eyes:     General: No scleral icterus.       Right eye: No discharge.     Conjunctiva/sclera: Conjunctivae normal.     Pupils: Pupils are equal, round, and reactive to light.  Neck:     Thyroid: No  thyromegaly.  Cardiovascular:     Rate and Rhythm: Normal rate and regular rhythm.     Heart sounds: Normal heart sounds. No murmur heard.   Pulmonary:     Effort: Pulmonary effort is normal. No respiratory distress.     Breath sounds: Normal breath sounds. No wheezing.  Abdominal:     General: Bowel sounds are normal. There is no distension.     Palpations: Abdomen is soft.     Tenderness: There is no abdominal tenderness. There is no guarding or rebound.  Musculoskeletal:        General: Normal range of motion.     Cervical back: Neck supple.  Lymphadenopathy:     Cervical: No cervical adenopathy.  Skin:    General: Skin is warm and dry.     Findings: No rash.  Neurological:     Mental Status: He is alert and oriented to person, place, and time.     Coordination: Coordination normal.  Psychiatric:        Behavior: Behavior normal.       Assessment & Plan:   Problem List Items Addressed This Visit      Digestive   GERD (gastroesophageal reflux disease)   Relevant Medications   famotidine (PEPCID) 20 MG tablet   Other Relevant Orders   CBC with Differential/Platelet   CMP14+EGFR     Nervous and Auditory   Parkinson's disease ([place]) - Primary   Relevant Orders   CBC with Differential/Platelet   CMP14+EGFR   Lipid panel     Genitourinary   BPH (benign prostatic hyperplasia)   Relevant Medications   tamsulosin (FLOMAX) 0.4 MG CAPS capsule   Other Relevant Orders   CBC with Differential/Platelet   CMP14+EGFR   PSA, total and free    Other Visit Diagnoses    Pharyngitis, unspecified etiology       Relevant Medications   famotidine (PEPCID) 20 MG tablet   Lipid screening       Relevant Orders   CBC with Differential/Platelet   CMP14+EGFR   Lipid panel      Had deep brain stimulation for one-sided his brain because of Parkinson's and it is helping the tremors.  Patient feels like he is doing well on famotidine as needed and feels like the Flomax is  helping a lot. Follow up plan: Return in about 1 year (around 05/07/2021), or if symptoms worsen or fail to improve, for Physical.  Counseling provided for all of the vaccine components Orders Placed This Encounter  Procedures  . CBC with Differential/Platelet  . CMP14+EGFR  . Lipid panel  . PSA, total and free    Caryl Pina, MD [facility] 05/07/2020, 10:21 AM

## 2020-05-08 LAB — CBC WITH DIFFERENTIAL/PLATELET
Basophils Absolute: 0.1 10*3/uL (ref 0.0–0.2)
Basos: 1 %
EOS (ABSOLUTE): 0.1 10*3/uL (ref 0.0–0.4)
Eos: 2 %
Hematocrit: 45.9 % (ref 37.5–51.0)
Hemoglobin: 15.8 g/dL (ref 13.0–17.7)
Immature Grans (Abs): 0 10*3/uL (ref 0.0–0.1)
Immature Granulocytes: 0 %
Lymphocytes Absolute: 1.9 10*3/uL (ref 0.7–3.1)
Lymphs: 32 %
MCH: 30.5 pg (ref 26.6–33.0)
MCHC: 34.4 g/dL (ref 31.5–35.7)
MCV: 89 fL (ref 79–97)
Monocytes Absolute: 0.5 10*3/uL (ref 0.1–0.9)
Monocytes: 8 %
Neutrophils Absolute: 3.3 10*3/uL (ref 1.4–7.0)
Neutrophils: 57 %
Platelets: 194 10*3/uL (ref 150–450)
RBC: 5.18 x10E6/uL (ref 4.14–5.80)
RDW: 12.6 % (ref 11.6–15.4)
WBC: 5.9 10*3/uL (ref 3.4–10.8)

## 2020-05-08 LAB — CMP14+EGFR
ALT: 12 IU/L (ref 0–44)
AST: 20 IU/L (ref 0–40)
Albumin/Globulin Ratio: 2.2 (ref 1.2–2.2)
Albumin: 4.6 g/dL (ref 3.8–4.8)
Alkaline Phosphatase: 92 IU/L (ref 44–121)
BUN/Creatinine Ratio: 17 (ref 10–24)
BUN: 19 mg/dL (ref 8–27)
Bilirubin Total: 1.1 mg/dL (ref 0.0–1.2)
CO2: 22 mmol/L (ref 20–29)
Calcium: 9.7 mg/dL (ref 8.6–10.2)
Chloride: 104 mmol/L (ref 96–106)
Creatinine, Ser: 1.12 mg/dL (ref 0.76–1.27)
Globulin, Total: 2.1 g/dL (ref 1.5–4.5)
Glucose: 157 mg/dL — ABNORMAL HIGH (ref 65–99)
Potassium: 4 mmol/L (ref 3.5–5.2)
Sodium: 142 mmol/L (ref 134–144)
Total Protein: 6.7 g/dL (ref 6.0–8.5)
eGFR: 74 mL/min/{1.73_m2} (ref >59)

## 2020-05-08 LAB — PSA, TOTAL AND FREE
PSA, Free Pct: 23.1 %
PSA, Free: 0.3 ng/mL
Prostate Specific Ag, Serum: 1.3 ng/mL (ref 0.0–4.0)

## 2020-05-08 LAB — LIPID PANEL
Chol/HDL Ratio: 6.1 ratio — ABNORMAL HIGH (ref 0.0–5.0)
Cholesterol, Total: 170 mg/dL (ref 100–199)
HDL: 28 mg/dL — ABNORMAL LOW (ref >39)
LDL Chol Calc (NIH): 112 mg/dL — ABNORMAL HIGH (ref 0–99)
Triglycerides: 169 mg/dL — ABNORMAL HIGH (ref 0–149)
VLDL Cholesterol Cal: 30 mg/dL (ref 5–40)

## 2020-05-12 DIAGNOSIS — N529 Male erectile dysfunction, unspecified: Secondary | ICD-10-CM | POA: Insufficient documentation

## 2020-05-12 DIAGNOSIS — G4730 Sleep apnea, unspecified: Secondary | ICD-10-CM | POA: Insufficient documentation

## 2020-05-12 DIAGNOSIS — G629 Polyneuropathy, unspecified: Secondary | ICD-10-CM | POA: Insufficient documentation

## 2020-05-12 DIAGNOSIS — R251 Tremor, unspecified: Secondary | ICD-10-CM | POA: Insufficient documentation

## 2020-05-12 DIAGNOSIS — Z87442 Personal history of urinary calculi: Secondary | ICD-10-CM | POA: Insufficient documentation

## 2020-05-12 DIAGNOSIS — M4800 Spinal stenosis, site unspecified: Secondary | ICD-10-CM | POA: Insufficient documentation

## 2020-05-14 ENCOUNTER — Encounter: Payer: Self-pay | Admitting: Cardiology

## 2020-05-14 ENCOUNTER — Other Ambulatory Visit: Payer: Self-pay

## 2020-05-14 ENCOUNTER — Ambulatory Visit: Payer: Medicare Other | Admitting: Cardiology

## 2020-05-14 VITALS — BP 124/80 | HR 63 | Ht 72.0 in | Wt 215.0 lb

## 2020-05-14 DIAGNOSIS — G20 Parkinson's disease: Secondary | ICD-10-CM

## 2020-05-14 DIAGNOSIS — I480 Paroxysmal atrial fibrillation: Secondary | ICD-10-CM | POA: Diagnosis not present

## 2020-05-14 DIAGNOSIS — I447 Left bundle-branch block, unspecified: Secondary | ICD-10-CM

## 2020-05-14 HISTORY — DX: Left bundle-branch block, unspecified: I44.7

## 2020-05-14 NOTE — Patient Instructions (Signed)

## 2020-05-14 NOTE — Progress Notes (Signed)
Cardiology Office Note:    Date:  05/14/2020   ID:  John Norton, DOB 06-01-1955, MRN [id number]  PCP:  Dettinger, Fransisca Kaufmann, MD  Cardiologist:  Jenean Lindau, MD   Referring MD: Dettinger, Fransisca Kaufmann, MD    ASSESSMENT:    1. Paroxysmal atrial fibrillation (HCC)   2. Parkinson's disease ([place])   3. Left bundle branch block    PLAN:    In order of problems listed above:  1. Primary prevention stressed with the patient.  Importance of compliance with diet medication stressed any vocalized understanding. 2. Paroxysmal atrial fibrillation: CHADS2 score is 0.  Patient is not on antianticoagulation.  He is asymptomatic other than the episode mentioned below.  Medical management at this time. 3. Left bundle branch block: On EKG and stable and we will continue to monitor. 4. Patient will be seen in follow-up appointment in 6 months or earlier if the patient has any concerns    Medication Adjustments/Labs and Tests Ordered: Current medicines are reviewed at length with the patient today.  Concerns regarding medicines are outlined above.  Orders Placed This Encounter  Procedures  . EKG 12-Lead   No orders of the defined types were placed in this encounter.    No chief complaint on file.    History of Present Illness:    John Norton is a 65 y.o. male.  Patient has past medical history of paroxysmal atrial fibrillation, Parkinson's disease and left bundle branch block.  He has undergone brain surgery to insert a device that controls his tremors.  This was uneventful except that he mentions to me that he went into atrial fibrillation in the postoperative time.  Subsequently is not no issues.  No chest pain orthopnea or PND.  At the time of my evaluation, the patient is alert awake oriented and in no distress.  Past Medical History:  Diagnosis Date  . 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 Atrial fibrillation ([place]) 2019  . BPH (benign prostatic hyperplasia) 05/07/2020  . Degeneration of  lumbar intervertebral disc 12/19/2017  . ED (erectile dysfunction)   . GERD (gastroesophageal reflux disease) 05/07/2020  . Head pain cephalgia 03/04/2016  . History of kidney stones YRS AGO  . LBBB (left bundle branch block)   . Low back pain 12/19/2017  . Neuropathy    FOREHEAD BURNING AND NUMBNESS  . Parkinson's disease   . Parkinson's disease ([place]) 12/30/2015  . Paroxysmal atrial fibrillation ([place]) 10/20/2016  . Plantar fasciitis 2017   IMPROVED  . Scoliosis deformity of spine 10/11/2017  . Sleep apnea    does not use cpap, NO SLEEP APNEA SINCE QUIT SMOKING 2012  . Spinal stenosis   . Spinal stenosis at L4-L5 level 11/03/2016  . Spinal stenosis of lumbar region 11/03/2016  . Tremors of nervous system     Past Surgical History:  Procedure Laterality Date  . "spot on lung"  SEVERAL YRS AGO   SPOT [place]  . CHEST X-RAY  08/21/10   No cardiopulmonary process noted.  . CHOLECYSTECTOMY N/A 04/20/2012   Procedure: LAPAROSCOPIC CHOLECYSTECTOMY;  Surgeon: Jamesetta So, MD;  Location: [facility];  Service: General;  Laterality: N/A;  . COLONOSCOPY  2013  . INGUINAL HERNIA REPAIR  1998  . LEXISCAN MYOVIEW  08/22/10    There is no stress-induced ischemia,  but a fixed deficit in the lateral apex and inferior wall.  Ejection fraction 51%  . LUMBAR LAMINECTOMY/DECOMPRESSION MICRODISCECTOMY N/A 11/03/2016   Procedure: Microlumbar Decompression L4-5,  L5-S1;  Surgeon: Susa Day, MD;  Location: [facility];  Service: Orthopedics;  Laterality: N/A;  120 mins  . POLYPECTOMY  2013   TA's  . THYROID LOBECTOMY     RIGHT THYROID    Current Medications: Current Meds  Medication Sig  . amantadine (SYMMETREL) 100 MG capsule Take 100 mg by mouth 3 (three) times daily.  . Ascorbic Acid (VITAMIN C) 100 MG tablet Take 100 mg by mouth daily.  Marland Kitchen aspirin EC 81 MG tablet Take 81 mg by mouth daily.   . carbidopa-levodopa (SINEMET) 25-100 MG per tablet Take 1.5 tablets by mouth 6 (six) times daily.   . famotidine  (PEPCID) 20 MG tablet Take 1 tablet (20 mg total) by mouth 2 (two) times daily.  Marland Kitchen ibuprofen (ADVIL,MOTRIN) 200 MG tablet Take 3 tablets (600 mg total) by mouth every 8 (eight) hours as needed for headache or mild pain. Resume 5 days post-op as needed  . Multiple Vitamin (MULTI-VITAMINS) TABS Take 1 tablet by mouth daily.   . pregabalin (LYRICA) 200 MG capsule Take 1 capsule (200 mg total) by mouth 2 (two) times daily.  . propranolol ER (INDERAL LA) 60 MG 24 hr capsule Take 60 mg by mouth daily.  . selegiline (ELDEPRYL) 5 MG capsule Take 1 capsule by mouth 2 (two) times daily.  . sildenafil (VIAGRA) 100 MG tablet Take 100 mg by mouth daily as needed for erectile dysfunction.  . tamsulosin (FLOMAX) 0.4 MG CAPS capsule Take 1 capsule (0.4 mg total) by mouth daily.     Allergies:   Codeine   Social History   Socioeconomic History  . Marital status: Married    Spouse name: Tye [place]  . Number of children: 1  . Years of education: 65  . Highest education level: 12th grade  Occupational History  . Occupation: disability  Tobacco Use  . Smoking status: Former Smoker    Packs/day: 1.50    Years: 40.00    Pack years: 60.00    Types: Cigarettes    Quit date: 07/30/2010    Years since quitting: 9.7  . Smokeless tobacco: Current User    Types: Snuff  Vaping Use  . Vaping Use: Never used  Substance and Sexual Activity  . Alcohol use: No  . Drug use: No  . Sexual activity: Not on file  Other Topics Concern  . Not on file  Social History Narrative   Lives with wife in a one story home.  Has one daughter.  On disability.  Education: 12th   Social Determinants of Health   Financial Resource Strain: Not on file  Food Insecurity: Not on file  Transportation Needs: Not on file  Physical Activity: Not on file  Stress: Not on file  Social Connections: Not on file     Family History: The patient's family history includes Colon polyps in his brother; Diabetes in his mother; Glaucoma in his  mother; Hypertension in his brother, daughter, and mother. There is no history of Colon cancer, Rectal cancer, Stomach cancer, or Esophageal cancer.  ROS:   Please see the history of present illness.    All other systems reviewed and are negative.  EKGs/Labs/Other Studies Reviewed:    The following studies were reviewed today: I discussed my findings with the patient in extensive length.  EKG reveals sinus rhythm and left bundle branch block.   Recent Labs: 05/07/2020: ALT 12; BUN 19; Creatinine, Ser 1.12; Hemoglobin 15.8; Platelets 194; Potassium 4.0; Sodium 142  Recent Lipid Panel  Component Value Date/Time   CHOL 170 05/07/2020 1038   TRIG 169 (H) 05/07/2020 1038   HDL 28 (L) 05/07/2020 1038   CHOLHDL 6.1 (H) 05/07/2020 1038   CHOLHDL 6.7 08/22/2010 0650   VLDL 49 (H) 08/22/2010 0650   LDLCALC 112 (H) 05/07/2020 1038    Physical Exam:    VS:  BP 124/80   Pulse 63   Ht 6' (1.829 m)   Wt 215 lb (97.5 kg)   SpO2 97%   BMI 29.16 kg/m     Wt Readings from Last 3 Encounters:  05/14/20 215 lb (97.5 kg)  05/07/20 209 lb (94.8 kg)  07/25/19 209 lb (94.8 kg)     GEN: Patient is in no acute distress HEENT: Normal NECK: No JVD; No carotid bruits LYMPHATICS: No lymphadenopathy CARDIAC: Hear sounds regular, 2/6 systolic murmur at the apex. RESPIRATORY:  Clear to auscultation without rales, wheezing or rhonchi  ABDOMEN: Soft, non-tender, non-distended MUSCULOSKELETAL:  No edema; No deformity  SKIN: Warm and dry NEUROLOGIC:  Alert and oriented x 3 PSYCHIATRIC:  Normal affect   Signed, Jenean Lindau, MD  05/14/2020 11:42 AM    [facility]

## 2020-05-27 DIAGNOSIS — Z9682 Presence of neurostimulator: Secondary | ICD-10-CM | POA: Diagnosis not present

## 2020-05-27 DIAGNOSIS — Z48811 Encounter for surgical aftercare following surgery on the nervous system: Secondary | ICD-10-CM | POA: Diagnosis not present

## 2020-05-27 DIAGNOSIS — G20 Parkinson's disease: Secondary | ICD-10-CM | POA: Diagnosis not present

## 2020-05-27 DIAGNOSIS — Z9689 Presence of other specified functional implants: Secondary | ICD-10-CM

## 2020-05-27 HISTORY — DX: Presence of other specified functional implants: Z96.89

## 2020-06-16 DIAGNOSIS — H5213 Myopia, bilateral: Secondary | ICD-10-CM | POA: Diagnosis not present

## 2020-06-22 DIAGNOSIS — Z79899 Other long term (current) drug therapy: Secondary | ICD-10-CM | POA: Diagnosis not present

## 2020-06-22 DIAGNOSIS — Z4542 Encounter for adjustment and management of neuropacemaker (brain) (peripheral nerve) (spinal cord): Secondary | ICD-10-CM | POA: Diagnosis not present

## 2020-06-22 DIAGNOSIS — G20 Parkinson's disease: Secondary | ICD-10-CM | POA: Diagnosis not present

## 2020-10-20 DIAGNOSIS — R490 Dysphonia: Secondary | ICD-10-CM | POA: Diagnosis not present

## 2020-10-20 DIAGNOSIS — Z9682 Presence of neurostimulator: Secondary | ICD-10-CM | POA: Diagnosis not present

## 2020-10-20 DIAGNOSIS — Z9689 Presence of other specified functional implants: Secondary | ICD-10-CM | POA: Diagnosis not present

## 2020-10-20 DIAGNOSIS — G20 Parkinson's disease: Secondary | ICD-10-CM | POA: Diagnosis not present

## 2020-10-20 DIAGNOSIS — G250 Essential tremor: Secondary | ICD-10-CM | POA: Diagnosis not present

## 2020-10-20 DIAGNOSIS — Z885 Allergy status to narcotic agent status: Secondary | ICD-10-CM | POA: Diagnosis not present

## 2020-11-18 ENCOUNTER — Ambulatory Visit: Payer: Medicare Other | Admitting: Cardiology

## 2020-12-02 DIAGNOSIS — Z9689 Presence of other specified functional implants: Secondary | ICD-10-CM | POA: Diagnosis not present

## 2020-12-02 DIAGNOSIS — G20 Parkinson's disease: Secondary | ICD-10-CM | POA: Diagnosis not present

## 2020-12-31 ENCOUNTER — Encounter: Payer: Self-pay | Admitting: Cardiology

## 2020-12-31 ENCOUNTER — Ambulatory Visit: Payer: Medicare Other | Admitting: Cardiology

## 2020-12-31 ENCOUNTER — Other Ambulatory Visit: Payer: Self-pay

## 2020-12-31 VITALS — BP 130/66 | HR 52 | Ht 72.0 in | Wt 204.1 lb

## 2020-12-31 DIAGNOSIS — Z9689 Presence of other specified functional implants: Secondary | ICD-10-CM | POA: Diagnosis not present

## 2020-12-31 DIAGNOSIS — I480 Paroxysmal atrial fibrillation: Secondary | ICD-10-CM

## 2020-12-31 DIAGNOSIS — G20 Parkinson's disease: Secondary | ICD-10-CM

## 2020-12-31 NOTE — Progress Notes (Signed)
Cardiology Office Note:    Date:  12/31/2020   ID:  John Norton, DOB April 28, 1955, MRN [id number]  PCP:  Dettinger, Fransisca Kaufmann, MD  Cardiologist:  Jenean Lindau, MD   Referring MD: Dettinger, Fransisca Kaufmann, MD    ASSESSMENT:    1. Paroxysmal atrial fibrillation (HCC)   2. Parkinson's disease ([place])   3. S/P deep brain stimulator placement    PLAN:    In order of problems listed above:  Primary prevention stressed with the patient.  Importance of compliance with diet medication stressed any vocalized understanding. Paroxysmal atrial fibrillation: Patient's CHA2DS2-VASc score is 0.  He is taking propranolol and tolerating it well.  He has no symptoms of palpitations or any such issues.  I discussed with him and educated him about Chad app and he will try to buy 1 to monitor himself as needed Parkinson's disease: Stable at this time.  Managed by his [facility] doctors.  He is planning to undergo second brain surgery for stimulator as it has helped him in the past. Mixed dyslipidemia: Diet was emphasized and he understands.  Questions were answered to his satisfaction.  Lipids reviewed. Patient will be seen in follow-up appointment in 6 months or earlier if the patient has any concerns    Medication Adjustments/Labs and Tests Ordered: Current medicines are reviewed at length with the patient today.  Concerns regarding medicines are outlined above.  No orders of the defined types were placed in this encounter.  No orders of the defined types were placed in this encounter.    No chief complaint on file.    History of Present Illness:    John Norton is a 65 y.o. male.  Patient has past medical history of paroxysmal atrial fibrillation, Parkinson's disease and mixed dyslipidemia.  He denies any problems at this time and takes care of activities of daily living.  No chest pain orthopnea or PND.  At the time of my evaluation, the patient is alert awake oriented and in no distress.   His gait is unstable because of Parkinson's.  He has had brain surgery and trying to get another 1 to reduce his tremors.  He denies any palpitations syncope or any such problems.  At the time of my evaluation, the patient is alert awake oriented and in no distress.  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   Atrial fibrillation ([place]) 2019   BPH (benign prostatic hyperplasia) 05/07/2020   Degeneration of lumbar intervertebral disc 12/19/2017   ED (erectile dysfunction)    GERD (gastroesophageal reflux disease) 05/07/2020   Head pain cephalgia 03/04/2016   History of kidney stones YRS AGO   LBBB (left bundle branch block)    Left bundle branch block 05/14/2020   Low back pain 12/19/2017   Neuropathy    FOREHEAD BURNING AND NUMBNESS   Parkinson's disease ([place]) 12/30/2015   Paroxysmal atrial fibrillation ([place]) 10/20/2016   Plantar fasciitis 2017   IMPROVED   S/P deep brain stimulator placement 05/27/2020   Scoliosis deformity of spine 10/11/2017   Sleep apnea    does not use cpap, NO SLEEP APNEA SINCE QUIT SMOKING 2012   Spinal stenosis    Spinal stenosis at L4-L5 level 11/03/2016   Spinal stenosis of lumbar region 11/03/2016   Tremors of nervous system     Past Surgical History:  Procedure Laterality Date   "spot on lung"  SEVERAL YRS AGO   SPOT [facility] AWAY   CHEST  X-RAY  08/21/10   No cardiopulmonary process noted.   CHOLECYSTECTOMY N/A 04/20/2012   Procedure: LAPAROSCOPIC CHOLECYSTECTOMY;  Surgeon: Jamesetta So, MD;  Location: [facility];  Service: General;  Laterality: N/A;   COLONOSCOPY  2013   [place]  08/22/10    There is no stress-induced ischemia,  but a fixed deficit in the lateral apex and inferior wall.  Ejection fraction 51%   LUMBAR LAMINECTOMY/DECOMPRESSION MICRODISCECTOMY N/A 11/03/2016   Procedure: Microlumbar Decompression L4-5, L5-S1;  Surgeon: Susa Day, MD;  Location: [facility];  Service:  Orthopedics;  Laterality: N/A;  120 mins   POLYPECTOMY  2013   TA's   THYROID LOBECTOMY     RIGHT THYROID    Current Medications: Current Meds  Medication Sig   amantadine (SYMMETREL) 100 MG capsule Take 100 mg by mouth 3 (three) times daily.   Ascorbic Acid (VITAMIN C) 100 MG tablet Take 100 mg by mouth daily.   aspirin EC 81 MG tablet Take 81 mg by mouth daily.    carbidopa-levodopa (SINEMET) 25-100 MG per tablet Take 1.5 tablets by mouth 6 (six) times daily.    famotidine (PEPCID) 20 MG tablet Take 1 tablet (20 mg total) by mouth 2 (two) times daily.   ibuprofen (ADVIL,MOTRIN) 200 MG tablet Take 3 tablets (600 mg total) by mouth every 8 (eight) hours as needed for headache or mild pain. Resume 5 days post-op as needed   Multiple Vitamin (MULTI-VITAMINS) TABS Take 1 tablet by mouth daily.    pregabalin (LYRICA) 200 MG capsule Take 1 capsule (200 mg total) by mouth 2 (two) times daily.   propranolol ER (INDERAL LA) 60 MG 24 hr capsule Take 60 mg by mouth daily.   selegiline (ELDEPRYL) 5 MG capsule Take 1 capsule by mouth 2 (two) times daily.   sildenafil (VIAGRA) 100 MG tablet Take 100 mg by mouth daily as needed for erectile dysfunction.   tamsulosin (FLOMAX) 0.4 MG CAPS capsule Take 1 capsule (0.4 mg total) by mouth daily.     Allergies:   Codeine   Social History   Socioeconomic History   Marital status: Married    Spouse name: Tye [place]   Number of children: 1   Years of education: 12   Highest education level: 12th grade  Occupational History   Occupation: disability  Tobacco Use   Smoking status: Former    Packs/day: 1.50    Years: 40.00    Pack years: 60.00    Types: Cigarettes    Quit date: 07/30/2010    Years since quitting: 10.4   Smokeless tobacco: Current    Types: Snuff  Vaping Use   Vaping Use: Never used  Substance and Sexual Activity   Alcohol use: No   Drug use: No   Sexual activity: Not on file  Other Topics Concern   Not on file  Social History  Narrative   Lives with wife in a one story home.  Has one daughter.  On disability.  Education: 12th   Social Determinants of Health   Financial Resource Strain: Not on file  Food Insecurity: Not on file  Transportation Needs: Not on file  Physical Activity: Not on file  Stress: Not on file  Social Connections: Not on file     Family History: The patient's family history includes Colon polyps in his brother; Diabetes in his mother; Glaucoma in his mother; Hypertension in his brother, daughter, and mother. There is no history  of Colon cancer, Rectal cancer, Stomach cancer, or Esophageal cancer.  ROS:   Please see the history of present illness.    All other systems reviewed and are negative.  EKGs/Labs/Other Studies Reviewed:    The following studies were reviewed today: I discussed my findings with the patient at length.   Recent Labs: 05/07/2020: ALT 12; BUN 19; Creatinine, Ser 1.12; Hemoglobin 15.8; Platelets 194; Potassium 4.0; Sodium 142  Recent Lipid Panel    Component Value Date/Time   CHOL 170 05/07/2020 1038   TRIG 169 (H) 05/07/2020 1038   HDL 28 (L) 05/07/2020 1038   CHOLHDL 6.1 (H) 05/07/2020 1038   CHOLHDL 6.7 08/22/2010 0650   VLDL 49 (H) 08/22/2010 0650   LDLCALC 112 (H) 05/07/2020 1038    Physical Exam:    VS:  BP 130/66   Pulse (!) 52   Ht 6' (1.829 m)   Wt 204 lb 1.9 oz (92.6 kg)   SpO2 95%   BMI 27.68 kg/m     Wt Readings from Last 3 Encounters:  12/31/20 204 lb 1.9 oz (92.6 kg)  05/14/20 215 lb (97.5 kg)  05/07/20 209 lb (94.8 kg)     GEN: Patient is in no acute distress HEENT: Normal NECK: No JVD; No carotid bruits LYMPHATICS: No lymphadenopathy CARDIAC: Hear sounds regular, 2/6 systolic murmur at the apex. RESPIRATORY:  Clear to auscultation without rales, wheezing or rhonchi  ABDOMEN: Soft, non-tender, non-distended MUSCULOSKELETAL:  No edema; No deformity  SKIN: Warm and dry NEUROLOGIC:  Alert and oriented x 3 PSYCHIATRIC:   Normal affect   Signed, Jenean Lindau, MD  12/31/2020 11:06 AM    [place]

## 2020-12-31 NOTE — Patient Instructions (Signed)
Medication Instructions:  Your physician recommends that you continue on your current medications as directed. Please refer to the Current Medication list given to you today.  *If you need a refill on your cardiac medications before your next appointment, please call your pharmacy*   Lab Work: None ordered If you have labs (blood work) drawn today and your tests are completely normal, you will receive your results only by: [place] (if you have MyChart) OR A paper copy in the mail If you have any lab test that is abnormal or we need to change your treatment, we will call you to review the results.   Testing/Procedures: None ordered   Follow-Up: At [facility], you and your health needs are our priority.  As part of our continuing mission to provide you with exceptional heart care, we have created designated Provider Care Teams.  These Care Teams include your primary Cardiologist (physician) and Advanced Practice Providers (APPs -  Physician Assistants and Nurse Practitioners) who all work together to provide you with the care you need, when you need it.  We recommend signing up for the patient portal called "MyChart".  Sign up information is provided on this After Visit Summary.  MyChart is used to connect with patients for Virtual Visits (Telemedicine).  Patients are able to view lab/test results, encounter notes, upcoming appointments, etc.  Non-urgent messages can be sent to your provider as well.   To learn more about what you can do with MyChart, go to [email or web address].    Your next appointment:   6 month(s)  The format for your next appointment:   In Person  Provider:   Jyl Heinz, MD   Other Instructions  FDA-cleared personal EKG: The world's most clinically validated personal EKG, FDA-cleared to detect Atrial Fibrillation, Bradycardia, and Tachycardia. Evalee Mutton is the most reliable way to check in on your heart from home. Take your EKG from  anywhere: Capture a medical-grade EKG in 30 seconds and get an instant analysis right on your smartphone. Evalee Mutton is small enough to fit in your pocket, so you can take it with you anywhere. Easy to use: Simply place your fingers on the sensors--no wires, patches, or gels. Recommended by doctors: A trusted resource, Evalee Mutton is the #1 doctor-recommended personal EKG with more than 100 million EKGs recorded. Save or share your EKGs: With the press of a button, email your EKGs to your doctor or save them on your phone. Works with smartphones: Compatible with Tour manager and tablets. Check our compatibility chart. FSA/HSA eligible: Purchase using an FSA or HSA account (please confirm coverage with your insurance provider). Phone clip included with purchase, a $15 value. Conveniently take your device with you wherever you go.  [email or web address]://[email or web address].tn  Step One- Record your EKG strip on [facility] app.   Step two- On Kardia EKG click "Download"   Step three- It will prompt you to make a password for this EKG. Please make the password "Bettina Gavia" so that we can view it.   Step four- Click on the little "upload" button (small box with an arrow in the middle) in the bottom left-hand corner of the screen.   Step five- Click "Save to Files"  Step six- Click on "On my iphone" and then "Pages" then press save in the top right-hand corner.   NOW GO TO MYCHART   Once on MyChart click "Messages"  Step one- Click "Send a message"  Step two- Click "Ask a medical question"   Step  three- Click "Non urgent medical question"   Step four- Click on [facility] name.  Step five- Click on the small paperclip at the bottom of the screen  Step six- Click "Choose file"  Step seven- Pick the most recent EKG strip listed.   Once uploaded send the message!

## 2021-01-02 IMAGING — CR DG CHEST 1V PORT
1 series · 1 of 1 positions shown · non-contrast
Comparison: Radiographs March 15, 2018.

CLINICAL DATA: Cough, fever.

EXAM:
PORTABLE CHEST 1 VIEW

[portable]
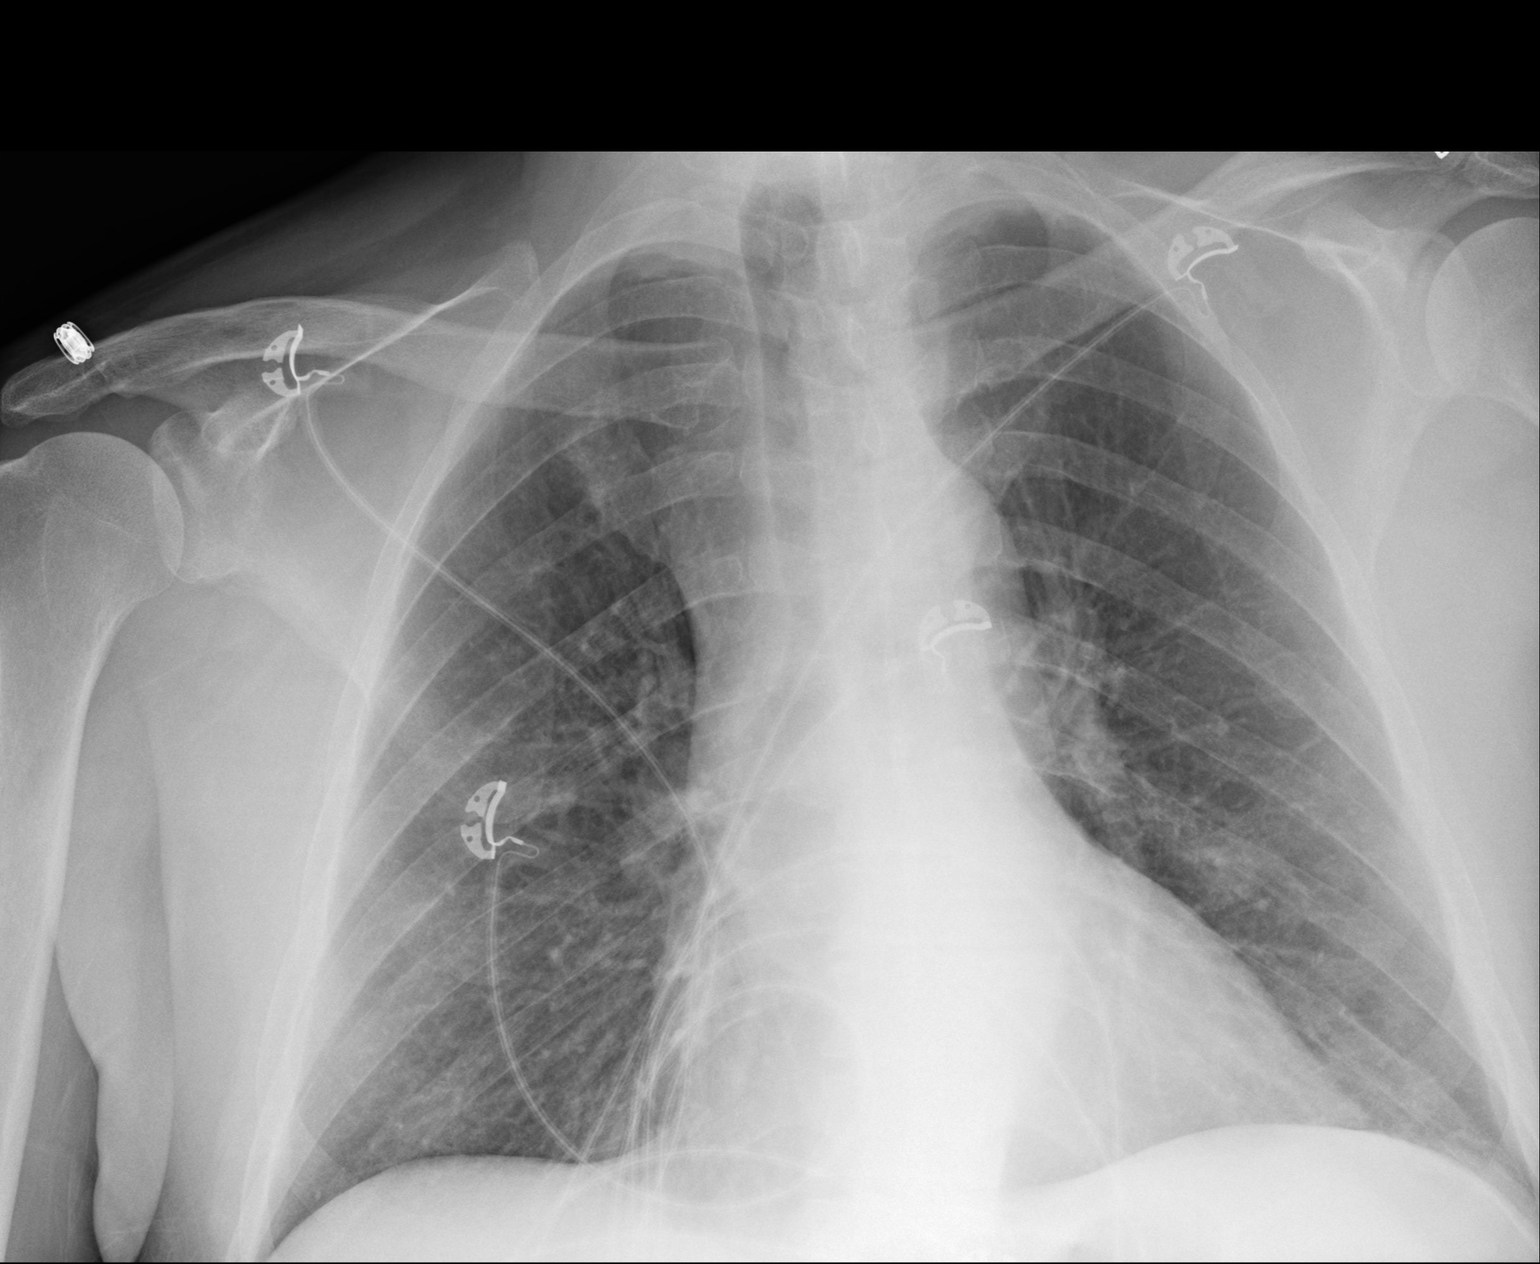

[1 of 1 positions shown; findings below may reference images not displayed]

FINDINGS: The heart size and mediastinal contours are within normal limits.
Both lungs are clear. The visualized skeletal structures are
unremarkable.
IMPRESSION: No active disease.

## 2021-02-17 ENCOUNTER — Telehealth: Payer: Self-pay | Admitting: Family Medicine

## 2021-02-17 NOTE — Telephone Encounter (Signed)
Left message for patient to call back and schedule Medicare Annual Wellness Visit (AWV) to be completed by video or phone.   Last AWV:  05/22/2019  Please schedule at anytime with [place]  45 minute appointment  Any questions, please contact me at [phone]

## 2021-03-13 ENCOUNTER — Other Ambulatory Visit: Payer: Self-pay | Admitting: Family Medicine

## 2021-03-13 DIAGNOSIS — N400 Enlarged prostate without lower urinary tract symptoms: Secondary | ICD-10-CM

## 2021-03-13 DIAGNOSIS — J029 Acute pharyngitis, unspecified: Secondary | ICD-10-CM

## 2021-03-16 ENCOUNTER — Telehealth: Payer: Self-pay | Admitting: Family Medicine

## 2021-03-16 NOTE — Telephone Encounter (Signed)
Left message for patient to call back and schedule Medicare Annual Wellness Visit (AWV) to be completed by video or phone.   Last AWV: 05/22/2019  Please schedule at anytime with [place]  45 minute appointment  Any questions, please contact me at [phone]

## 2021-03-25 DIAGNOSIS — I480 Paroxysmal atrial fibrillation: Secondary | ICD-10-CM | POA: Diagnosis not present

## 2021-03-25 DIAGNOSIS — G20 Parkinson's disease: Secondary | ICD-10-CM | POA: Diagnosis not present

## 2021-04-01 DIAGNOSIS — M48061 Spinal stenosis, lumbar region without neurogenic claudication: Secondary | ICD-10-CM | POA: Diagnosis not present

## 2021-04-01 DIAGNOSIS — I480 Paroxysmal atrial fibrillation: Secondary | ICD-10-CM | POA: Diagnosis not present

## 2021-04-01 DIAGNOSIS — I447 Left bundle-branch block, unspecified: Secondary | ICD-10-CM | POA: Diagnosis not present

## 2021-04-01 DIAGNOSIS — Z87891 Personal history of nicotine dependence: Secondary | ICD-10-CM | POA: Diagnosis not present

## 2021-04-01 DIAGNOSIS — Z79899 Other long term (current) drug therapy: Secondary | ICD-10-CM | POA: Diagnosis not present

## 2021-04-01 DIAGNOSIS — G250 Essential tremor: Secondary | ICD-10-CM | POA: Diagnosis not present

## 2021-04-01 DIAGNOSIS — G20 Parkinson's disease: Secondary | ICD-10-CM | POA: Diagnosis not present

## 2021-04-01 DIAGNOSIS — Z7982 Long term (current) use of aspirin: Secondary | ICD-10-CM | POA: Diagnosis not present

## 2021-04-01 DIAGNOSIS — Z885 Allergy status to narcotic agent status: Secondary | ICD-10-CM | POA: Diagnosis not present

## 2021-04-06 ENCOUNTER — Telehealth: Payer: Self-pay | Admitting: Family Medicine

## 2021-04-06 NOTE — Telephone Encounter (Signed)
Left message for patient to call back and schedule Medicare Annual Wellness Visit (AWV) to be completed by video or phone.   Last AWV: 05/22/2019  Please schedule at anytime with [place]  45 minute appointment  Any questions, please contact me at [phone]

## 2021-04-07 DIAGNOSIS — Z20822 Contact with and (suspected) exposure to covid-19: Secondary | ICD-10-CM | POA: Diagnosis not present

## 2021-04-08 DIAGNOSIS — Z20822 Contact with and (suspected) exposure to covid-19: Secondary | ICD-10-CM | POA: Diagnosis not present

## 2021-04-09 DIAGNOSIS — Z20822 Contact with and (suspected) exposure to covid-19: Secondary | ICD-10-CM | POA: Diagnosis not present

## 2021-04-15 DIAGNOSIS — Z48811 Encounter for surgical aftercare following surgery on the nervous system: Secondary | ICD-10-CM | POA: Diagnosis not present

## 2021-04-15 DIAGNOSIS — G20 Parkinson's disease: Secondary | ICD-10-CM | POA: Diagnosis not present

## 2021-04-15 DIAGNOSIS — Z9682 Presence of neurostimulator: Secondary | ICD-10-CM | POA: Diagnosis not present

## 2021-05-03 DIAGNOSIS — Z4542 Encounter for adjustment and management of neuropacemaker (brain) (peripheral nerve) (spinal cord): Secondary | ICD-10-CM | POA: Diagnosis not present

## 2021-05-03 DIAGNOSIS — Z9689 Presence of other specified functional implants: Secondary | ICD-10-CM | POA: Diagnosis not present

## 2021-05-03 DIAGNOSIS — Z9889 Other specified postprocedural states: Secondary | ICD-10-CM | POA: Diagnosis not present

## 2021-05-03 DIAGNOSIS — Z9682 Presence of neurostimulator: Secondary | ICD-10-CM | POA: Diagnosis not present

## 2021-05-03 DIAGNOSIS — G20 Parkinson's disease: Secondary | ICD-10-CM | POA: Diagnosis not present

## 2021-05-10 ENCOUNTER — Encounter: Payer: Self-pay | Admitting: Family Medicine

## 2021-05-10 ENCOUNTER — Ambulatory Visit (INDEPENDENT_AMBULATORY_CARE_PROVIDER_SITE_OTHER): Payer: Medicare Other | Admitting: Family Medicine

## 2021-05-10 VITALS — BP 138/76 | HR 75 | Ht 72.0 in | Wt 208.0 lb

## 2021-05-10 DIAGNOSIS — J029 Acute pharyngitis, unspecified: Secondary | ICD-10-CM | POA: Diagnosis not present

## 2021-05-10 DIAGNOSIS — Z13220 Encounter for screening for lipoid disorders: Secondary | ICD-10-CM | POA: Diagnosis not present

## 2021-05-10 DIAGNOSIS — N400 Enlarged prostate without lower urinary tract symptoms: Secondary | ICD-10-CM

## 2021-05-10 DIAGNOSIS — G20 Parkinson's disease: Secondary | ICD-10-CM | POA: Diagnosis not present

## 2021-05-10 DIAGNOSIS — I480 Paroxysmal atrial fibrillation: Secondary | ICD-10-CM | POA: Diagnosis not present

## 2021-05-10 DIAGNOSIS — Z136 Encounter for screening for cardiovascular disorders: Secondary | ICD-10-CM | POA: Diagnosis not present

## 2021-05-10 MED ORDER — FAMOTIDINE 20 MG PO TABS: 20.0000 mg | ORAL_TABLET | Freq: Two times a day (BID) | ORAL | 3 refills | Status: DC

## 2021-05-10 MED ORDER — TAMSULOSIN HCL 0.4 MG PO CAPS: 0.4000 mg | ORAL_CAPSULE | Freq: Every day | ORAL | 3 refills | Status: DC

## 2021-05-10 NOTE — Progress Notes (Signed)
? ?BP 138/76   Pulse 75   Ht 6' (1.829 m)   Wt 208 lb (94.3 kg)   SpO2 98%   BMI 28.21 kg/m?   ? ?Subjective:  ? ?Patient ID: John Norton, male    DOB: September 07, 1955, 66 y.o.   MRN: [id number] ? ?HPI: ?John Norton is a 66 y.o. male presenting on 05/10/2021 for Medical Management of Chronic Issues ? ? ?HPI ?Hyperlipidemia ?Patient is coming in for recheck of his hyperlipidemia. The patient is currently taking no medicine currently, has been trying diet control. They deny any issues with myalgias or history of liver damage from it. They deny any focal numbness or weakness or chest pain.  ? ?BPH ?Patient is coming in for recheck on BPH ?Symptoms: None currently ?Medication: Tamsulosin ?Last PSA: Over a year ago, will recheck today ? ?Parkinson's disease ?Patient has Parkinson's and sees neurology and they are managing it.  He just had a neurostimulator placed in. ? ?Relevant past medical, surgical, family and social history reviewed and updated as indicated. Interim medical history since our last visit reviewed. ?Allergies and medications reviewed and updated. ? ?Review of Systems  ?Constitutional:  Negative for chills and fever.  ?Eyes:  Negative for visual disturbance.  ?Respiratory:  Negative for shortness of breath and wheezing.   ?Cardiovascular:  Negative for chest pain and leg swelling.  ?Musculoskeletal:  Negative for back pain and gait problem.  ?Skin:  Negative for rash.  ?Neurological:  Negative for dizziness, seizures and weakness.  ?All other systems reviewed and are negative. ? ?Per HPI unless specifically indicated above ? ? ?Allergies as of 05/10/2021   ? ?   Reactions  ? Codeine Nausea Only  ? ?  ? ?  ?Medication List  ?  ? ?  ? Accurate as of May 10, 2021 10:20 AM. If you have any questions, ask your nurse or doctor.  ?  ?  ? ?  ? ?amantadine 100 MG capsule ?Commonly known as: SYMMETREL ?Take 100 mg by mouth 3 (three) times daily. ?  ?aspirin EC 81 MG tablet ?Take 81 mg by mouth daily. ?   ?carbidopa-levodopa 25-100 MG tablet ?Commonly known as: SINEMET IR ?Take 1.5 tablets by mouth 6 (six) times daily. ?  ?famotidine 20 MG tablet ?Commonly known as: PEPCID ?Take 1 tablet (20 mg total) by mouth 2 (two) times daily. ?  ?ibuprofen 200 MG tablet ?Commonly known as: ADVIL ?Take 3 tablets (600 mg total) by mouth every 8 (eight) hours as needed for headache or mild pain. Resume 5 days post-op as needed ?  ?Multi-Vitamins Tabs ?Take 1 tablet by mouth daily. ?  ?pregabalin 200 MG capsule ?Commonly known as: Lyrica ?Take 1 capsule (200 mg total) by mouth 2 (two) times daily. ?  ?propranolol ER 60 MG 24 hr capsule ?Commonly known as: INDERAL LA ?Take 60 mg by mouth daily. ?  ?selegiline 5 MG capsule ?Commonly known as: ELDEPRYL ?Take 1 capsule by mouth 2 (two) times daily. ?  ?sildenafil 100 MG tablet ?Commonly known as: VIAGRA ?Take 100 mg by mouth daily as needed for erectile dysfunction. ?  ?tamsulosin 0.4 MG Caps capsule ?Commonly known as: FLOMAX ?Take 1 capsule (0.4 mg total) by mouth daily. ?  ?vitamin C 100 MG tablet ?Take 100 mg by mouth daily. ?  ? ?  ? ? ? ?Objective:  ? ?BP 138/76   Pulse 75   Ht 6' (1.829 m)   Wt 208 lb (94.3 kg)  SpO2 98%   BMI 28.21 kg/m?   ?Wt Readings from Last 3 Encounters:  ?05/10/21 208 lb (94.3 kg)  ?12/31/20 204 lb 1.9 oz (92.6 kg)  ?05/14/20 215 lb (97.5 kg)  ?  ?Physical Exam ?Vitals and nursing note reviewed.  ?Constitutional:   ?   General: He is not in acute distress. ?   Appearance: He is well-developed. He is not diaphoretic.  ?Eyes:  ?   General: No scleral icterus. ?   Conjunctiva/sclera: Conjunctivae normal.  ?Neck:  ?   Thyroid: No thyromegaly.  ?Cardiovascular:  ?   Rate and Rhythm: Normal rate and regular rhythm.  ?   Heart sounds: Normal heart sounds. No murmur heard. ?Pulmonary:  ?   Effort: Pulmonary effort is normal. No respiratory distress.  ?   Breath sounds: Normal breath sounds. No wheezing.  ?Musculoskeletal:     ?   General: No swelling.  Normal range of motion.  ?   Cervical back: Neck supple.  ?Lymphadenopathy:  ?   Cervical: No cervical adenopathy.  ?Skin: ?   General: Skin is warm and dry.  ?   Findings: No rash.  ?Neurological:  ?   Mental Status: He is alert and oriented to person, place, and time.  ?   Coordination: Coordination normal.  ?   Comments: Fine tremor in right hand  ?Psychiatric:     ?   Behavior: Behavior normal.  ? ? ? ? ?Assessment & Plan:  ? ?Problem List Items Addressed This Visit   ? ?  ? Cardiovascular and Mediastinum  ? Paroxysmal atrial fibrillation (HCC)  ?  ? Nervous and Auditory  ? Parkinson's disease ([place])  ?  ? Genitourinary  ? BPH (benign prostatic hyperplasia) - Primary  ? Relevant Medications  ? tamsulosin (FLOMAX) 0.4 MG CAPS capsule  ? Other Relevant Orders  ? PSA, total and free  ? ?Other Visit Diagnoses   ? ? Lipid screening      ? Relevant Orders  ? CMP14+EGFR  ? Lipid panel  ? Pharyngitis, unspecified etiology      ? Relevant Medications  ? famotidine (PEPCID) 20 MG tablet  ? ?  ?  ?Just had a CBC and basic metabolic panel on January 26 ?Follow up plan: ?Return in about 1 year (around 05/11/2022), or if symptoms worsen or fail to improve, for Recheck BPH and GERD and Parkinson's. ? ?Counseling provided for all of the vaccine components ?Orders Placed This Encounter  ?Procedures  ? CMP14+EGFR  ? Lipid panel  ? PSA, total and free  ? ? ?Caryl Pina, MD ?[place] ?05/10/2021, 10:20 AM ? ? ? ? ?

## 2021-05-11 LAB — CMP14+EGFR
ALT: 7 IU/L (ref 0–44)
AST: 12 IU/L (ref 0–40)
Albumin/Globulin Ratio: 1.6 (ref 1.2–2.2)
Albumin: 4.4 g/dL (ref 3.8–4.8)
Alkaline Phosphatase: 104 IU/L (ref 44–121)
BUN/Creatinine Ratio: 16 (ref 10–24)
BUN: 16 mg/dL (ref 8–27)
Bilirubin Total: 0.5 mg/dL (ref 0.0–1.2)
CO2: 24 mmol/L (ref 20–29)
Calcium: 9.5 mg/dL (ref 8.6–10.2)
Chloride: 103 mmol/L (ref 96–106)
Creatinine, Ser: 1.03 mg/dL (ref 0.76–1.27)
Globulin, Total: 2.7 g/dL (ref 1.5–4.5)
Glucose: 128 mg/dL — ABNORMAL HIGH (ref 70–99)
Potassium: 4.1 mmol/L (ref 3.5–5.2)
Sodium: 141 mmol/L (ref 134–144)
Total Protein: 7.1 g/dL (ref 6.0–8.5)
eGFR: 81 mL/min/{1.73_m2} (ref >59)

## 2021-05-11 LAB — LIPID PANEL
Chol/HDL Ratio: 5.9 ratio — ABNORMAL HIGH (ref 0.0–5.0)
Cholesterol, Total: 171 mg/dL (ref 100–199)
HDL: 29 mg/dL — ABNORMAL LOW (ref >39)
LDL Chol Calc (NIH): 106 mg/dL — ABNORMAL HIGH (ref 0–99)
Triglycerides: 204 mg/dL — ABNORMAL HIGH (ref 0–149)
VLDL Cholesterol Cal: 36 mg/dL (ref 5–40)

## 2021-05-11 LAB — PSA, TOTAL AND FREE
PSA, Free Pct: 20.8 %
PSA, Free: 0.27 ng/mL
Prostate Specific Ag, Serum: 1.3 ng/mL (ref 0.0–4.0)

## 2021-05-19 ENCOUNTER — Telehealth: Payer: Self-pay | Admitting: Family Medicine

## 2021-05-19 NOTE — Telephone Encounter (Signed)
Left message for patient to call back and schedule Medicare Annual Wellness Visit (AWV) to be completed by video or phone.  ? ?Last AWV: 05/22/2019 ? ?Please schedule at anytime with [place] ? ?45 minute appointment ? ?Any questions, please contact me at [phone]  ?  ?  ?

## 2021-05-19 NOTE — Telephone Encounter (Signed)
Labs have not been reviewed at this time. Wife made aware. ?

## 2021-05-19 NOTE — Telephone Encounter (Signed)
Please call patient or spouse with lab results. ?

## 2021-05-26 ENCOUNTER — Ambulatory Visit (INDEPENDENT_AMBULATORY_CARE_PROVIDER_SITE_OTHER): Payer: Medicare Other

## 2021-05-26 VITALS — Ht 72.0 in | Wt 208.0 lb

## 2021-05-26 DIAGNOSIS — Z0000 Encounter for general adult medical examination without abnormal findings: Secondary | ICD-10-CM

## 2021-05-26 NOTE — Patient Instructions (Signed)
John Norton , ?Thank you for taking time to come for your Medicare Wellness Visit. I appreciate your ongoing commitment to your health goals. Please review the following plan we discussed and let me know if I can assist you in the future.  ? ?Screening recommendations/referrals: ?Colonoscopy: Done 07/25/2019 Repeat every 7 years. ? ?Recommended yearly ophthalmology/optometry visit for glaucoma screening and checkup ?Recommended yearly dental visit for hygiene and checkup ? ?Vaccinations: ?Influenza vaccine: Declined.  ?Pneumococcal vaccine: Done 01/14/2019. Second dose due after age 64. ?Tdap vaccine: Done 05/07/2020. Repeat in 10 years ? ?Shingles vaccine: Done 05/02/2019 and 07/05/2019.   ?Covid-19: Done 08/01/2019, 08/29/2019 and 03/06/2020 ? ?Advanced directives: Advance directive discussed with you today. Even though you declined this today, please call our office should you change your mind, and we can give you the proper paperwork for you to fill out. ? ? ?Conditions/risks identified: Aim for 30 minutes of exercise or brisk walking, 6-8 glasses of water, and 5 servings of fruits and vegetables each day. ? ? ?Next appointment: Follow up in one year for your annual wellness visit 2024. ? ?Preventive Care 40-64 Years, Male ?Preventive care refers to lifestyle choices and visits with your health care provider that can promote health and wellness. ?What does preventive care include? ?A yearly physical exam. This is also called an annual well check. ?Dental exams once or twice a year. ?Routine eye exams. Ask your health care provider how often you should have your eyes checked. ?Personal lifestyle choices, including: ?Daily care of your teeth and gums. ?Regular physical activity. ?Eating a healthy diet. ?Avoiding tobacco and drug use. ?Limiting alcohol use. ?Practicing safe sex. ?Taking low-dose aspirin every day starting at age 44. ?What happens during an annual well check? ?The services and screenings done by your health care  provider during your annual well check will depend on your age, overall health, lifestyle risk factors, and family history of disease. ?Counseling  ?Your health care provider may ask you questions about your: ?Alcohol use. ?Tobacco use. ?Drug use. ?Emotional well-being. ?Home and relationship well-being. ?Sexual activity. ?Eating habits. ?Work and work Statistician. ?Screening  ?You may have the following tests or measurements: ?Height, weight, and BMI. ?Blood pressure. ?Lipid and cholesterol levels. These may be checked every 5 years, or more frequently if you are over 15 years old. ?Skin check. ?Lung cancer screening. You may have this screening every year starting at age 54 if you have a 30-pack-year history of smoking and currently smoke or have quit within the past 15 years. ?Fecal occult blood test (FOBT) of the stool. You may have this test every year starting at age 54. ?Flexible sigmoidoscopy or colonoscopy. You may have a sigmoidoscopy every 5 years or a colonoscopy every 10 years starting at age 12. ?Prostate cancer screening. Recommendations will vary depending on your family history and other risks. ?Hepatitis C blood test. ?Hepatitis B blood test. ?Sexually transmitted disease (STD) testing. ?Diabetes screening. This is done by checking your blood sugar (glucose) after you have not eaten for a while (fasting). You may have this done every 1-3 years. ?Discuss your test results, treatment options, and if necessary, the need for more tests with your health care provider. ?Vaccines  ?Your health care provider may recommend certain vaccines, such as: ?Influenza vaccine. This is recommended every year. ?Tetanus, diphtheria, and acellular pertussis (Tdap, Td) vaccine. You may need a Td booster every 10 years. ?Zoster vaccine. You may need this after age 31. ?Pneumococcal 13-valent conjugate (PCV13) vaccine. You  may need this if you have certain conditions and have not been vaccinated. ?Pneumococcal  polysaccharide (PPSV23) vaccine. You may need one or two doses if you smoke cigarettes or if you have certain conditions. ?Talk to your health care provider about which screenings and vaccines you need and how often you need them. ?This information is not intended to replace advice given to you by your health care provider. Make sure you discuss any questions you have with your health care provider. ?Document Released: 03/13/2015 Document Revised: 11/04/2015 Document Reviewed: 12/16/2014 ?Elsevier Interactive Patient Education ? 2017 [place]. ? ?Fall Prevention in the Home ?Falls can cause injuries. They can happen to people of all ages. There are many things you can do to make your home safe and to help prevent falls. ?What can I do on the outside of my home? ?Regularly fix the edges of walkways and driveways and fix any cracks. ?Remove anything that might make you trip as you walk through a door, such as a raised step or threshold. ?Trim any bushes or trees on the path to your home. ?Use bright outdoor lighting. ?Clear any walking paths of anything that might make someone trip, such as rocks or tools. ?Regularly check to see if handrails are loose or broken. Make sure that both sides of any steps have handrails. ?Any raised decks and porches should have guardrails on the edges. ?Have any leaves, snow, or ice cleared regularly. ?Use sand or salt on walking paths during winter. ?Clean up any spills in your garage right away. This includes oil or grease spills. ?What can I do in the bathroom? ?Use night lights. ?Install grab bars by the toilet and in the tub and shower. Do not use towel bars as grab bars. ?Use non-skid mats or decals in the tub or shower. ?If you need to sit down in the shower, use a plastic, non-slip stool. ?Keep the floor dry. Clean up any water that spills on the floor as soon as it happens. ?Remove soap buildup in the tub or shower regularly. ?Attach bath mats securely with double-sided  non-slip rug tape. ?Do not have throw rugs and other things on the floor that can make you trip. ?What can I do in the bedroom? ?Use night lights. ?Make sure that you have a light by your bed that is easy to reach. ?Do not use any sheets or blankets that are too big for your bed. They should not hang down onto the floor. ?Have a firm chair that has side arms. You can use this for support while you get dressed. ?Do not have throw rugs and other things on the floor that can make you trip. ?What can I do in the kitchen? ?Clean up any spills right away. ?Avoid walking on wet floors. ?Keep items that you use a lot in easy-to-reach places. ?If you need to reach something above you, use a strong step stool that has a grab bar. ?Keep electrical cords out of the way. ?Do not use floor polish or wax that makes floors slippery. If you must use wax, use non-skid floor wax. ?Do not have throw rugs and other things on the floor that can make you trip. ?What can I do with my stairs? ?Do not leave any items on the stairs. ?Make sure that there are handrails on both sides of the stairs and use them. Fix handrails that are broken or loose. Make sure that handrails are as long as the stairways. ?Check any carpeting to make  sure that it is firmly attached to the stairs. Fix any carpet that is loose or worn. ?Avoid having throw rugs at the top or bottom of the stairs. If you do have throw rugs, attach them to the floor with carpet tape. ?Make sure that you have a light switch at the top of the stairs and the bottom of the stairs. If you do not have them, ask someone to add them for you. ?What else can I do to help prevent falls? ?Wear shoes that: ?Do not have high heels. ?Have rubber bottoms. ?Are comfortable and fit you well. ?Are closed at the toe. Do not wear sandals. ?If you use a stepladder: ?Make sure that it is fully opened. Do not climb a closed stepladder. ?Make sure that both sides of the stepladder are locked into place. ?Ask  someone to hold it for you, if possible. ?Clearly mark and make sure that you can see: ?Any grab bars or handrails. ?First and last steps. ?Where the edge of each step is. ?Use tools that help you move around (mobi

## 2021-05-26 NOTE — Progress Notes (Signed)
? ?Subjective:  ? John Norton is a 66 y.o. male who presents for Medicare Annual/Subsequent preventive examination. ?Virtual Visit via Telephone Note ? ?I connected with  [name] on 05/26/21 at  9:00 AM EDT by telephone and verified that I am speaking with the correct person using two identifiers. ? ?Location: ?Patient: HOME ?Provider: [facility] ?Persons participating in the virtual visit: patient/Nurse Health Advisor ?  ?I discussed the limitations, risks, security and privacy concerns of performing an evaluation and management service by telephone and the availability of in person appointments. The patient expressed understanding and agreed to proceed. ? ?Interactive audio and video telecommunications were attempted between this nurse and patient, however failed, due to patient having technical difficulties OR patient did not have access to video capability.  We continued and completed visit with audio only. ? ?Some vital signs may be absent or patient reported.  ? ?Chriss Driver, LPN ? ?Review of Systems    ? ?Cardiac Risk Factors include: advanced age (>81mn, >>52women);Other (see comment);male gender;sedentary lifestyle, Risk factor comments: AFIB, PARKINSONS ? ?   ?Objective:  ?  ?Today's Vitals  ? 05/26/21 0856 05/26/21 0857  ?Weight: 208 lb (94.3 kg)   ?Height: 6' (1.829 m)   ?PainSc:  5   ? ?Body mass index is 28.21 kg/m?. ? ? ?  05/26/2021  ?  9:05 AM 05/22/2019  ? 10:41 AM 12/03/2018  ?  2:47 PM 11/03/2016  ? 10:45 AM 10/28/2016  ? 11:36 AM 06/21/2016  ?  9:15 AM 04/20/2012  ? 10:36 AM  ?Advanced Directives  ?Does Patient Have a Medical Advance Directive? No No No No No No Patient does not have advance directive;Patient would not like information  ?Would patient like information on creating a medical advance directive? No - Patient declined No - Patient declined  No - Patient declined No - Patient declined    ? ? ?Current Medications (verified) ?Outpatient Encounter Medications as of 05/26/2021   ?Medication Sig  ? amantadine (SYMMETREL) 100 MG capsule Take 100 mg by mouth 3 (three) times daily.  ? Ascorbic Acid (VITAMIN C) 100 MG tablet Take 100 mg by mouth daily.  ? aspirin EC 81 MG tablet Take 81 mg by mouth daily.   ? carbidopa-levodopa (SINEMET) 25-100 MG per tablet Take 1.5 tablets by mouth 6 (six) times daily.   ? famotidine (PEPCID) 20 MG tablet Take 1 tablet (20 mg total) by mouth 2 (two) times daily.  ? ibuprofen (ADVIL,MOTRIN) 200 MG tablet Take 3 tablets (600 mg total) by mouth every 8 (eight) hours as needed for headache or mild pain. Resume 5 days post-op as needed  ? Multiple Vitamin (MULTI-VITAMINS) TABS Take 1 tablet by mouth daily.   ? pregabalin (LYRICA) 200 MG capsule Take 1 capsule (200 mg total) by mouth 2 (two) times daily.  ? propranolol ER (INDERAL LA) 60 MG 24 hr capsule Take 60 mg by mouth daily.  ? selegiline (ELDEPRYL) 5 MG capsule Take 1 capsule by mouth 2 (two) times daily.  ? sildenafil (VIAGRA) 100 MG tablet Take 100 mg by mouth daily as needed for erectile dysfunction.  ? tamsulosin (FLOMAX) 0.4 MG CAPS capsule Take 1 capsule (0.4 mg total) by mouth daily.  ? [DISCONTINUED] sildenafil (VIAGRA) 50 MG tablet Take by mouth.  ? ?No facility-administered encounter medications on file as of 05/26/2021.  ? ? ?Allergies (verified) ?Codeine  ? ?History: ?Past Medical History:  ?Diagnosis Date  ? Action tremor 05/14/2012  ?  Atrial fibrillation ([place]) 2019  ? BPH (benign prostatic hyperplasia) 05/07/2020  ? Degeneration of lumbar intervertebral disc 12/19/2017  ? ED (erectile dysfunction)   ? GERD (gastroesophageal reflux disease) 05/07/2020  ? Head pain cephalgia 03/04/2016  ? History of kidney stones YRS AGO  ? LBBB (left bundle branch block)   ? Left bundle branch block 05/14/2020  ? Low back pain 12/19/2017  ? Neuropathy   ? FOREHEAD BURNING AND NUMBNESS  ? Parkinson's disease ([place]) 12/30/2015  ? Paroxysmal atrial fibrillation ([place]) 10/20/2016  ? Plantar fasciitis 2017  ? IMPROVED  ?  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 Scoliosis deformity of spine 10/11/2017  ? Sleep apnea   ? does not use cpap, NO SLEEP APNEA SINCE QUIT SMOKING 2012  ? Spinal stenosis   ? Spinal stenosis at L4-L5 level 11/03/2016  ? Spinal stenosis of lumbar region 11/03/2016  ? Tremors of nervous system   ? ?Past Surgical History:  ?Procedure Laterality Date  ? "spot on lung"  SEVERAL YRS AGO  ? SPOT WENY AWAY  ? CHEST X-RAY  08/21/10  ? No cardiopulmonary process noted.  ? CHOLECYSTECTOMY N/A 04/20/2012  ? Procedure: LAPAROSCOPIC CHOLECYSTECTOMY;  Surgeon: Jamesetta So, MD;  Location: [facility];  Service: General;  Laterality: N/A;  ? COLONOSCOPY  2013  ? [place]  ? LEXISCAN MYOVIEW  08/22/10  ?  There is no stress-induced ischemia,  but a fixed deficit in the lateral apex and inferior wall.  Ejection fraction 51%  ? LUMBAR LAMINECTOMY/DECOMPRESSION MICRODISCECTOMY N/A 11/03/2016  ? Procedure: Microlumbar Decompression L4-5, L5-S1;  Surgeon: Susa Day, MD;  Location: [facility];  Service: Orthopedics;  Laterality: N/A;  120 mins  ? POLYPECTOMY  2013  ? [facility]  ? THYROID LOBECTOMY    ? RIGHT THYROID  ? ?Family History  ?Problem Relation Age of Onset  ? Colon polyps Brother   ? Hypertension Brother   ? Diabetes Mother   ? Hypertension Mother   ? Glaucoma Mother   ? Hypertension Daughter   ? Colon cancer Neg Hx   ? Rectal cancer Neg Hx   ? Stomach cancer Neg Hx   ? Esophageal cancer Neg Hx   ? ?Social History  ? ?Socioeconomic History  ? Marital status: Married  ?  Spouse name: John Norton  ? Number of children: 1  ? Years of education: 22  ? Highest education level: 12th grade  ?Occupational History  ? Occupation: disability  ?Tobacco Use  ? Smoking status: Former  ?  Packs/day: 1.50  ?  Years: 40.00  ?  Pack years: 60.00  ?  Types: Cigarettes  ?  Quit date: 07/30/2010  ?  Years since quitting: 10.8  ? Smokeless tobacco: Current  ?  Types: Snuff  ?Vaping Use  ? Vaping Use: Never used  ?Substance and Sexual  Activity  ? Alcohol use: No  ? Drug use: No  ? Sexual activity: Not on file  ?Other Topics Concern  ? Not on file  ?Social History Narrative  ? Lives with wife in a one story home.  Has one daughter.  On disability.  Education: 12th  ? ?Social Determinants of Health  ? ?Financial Resource Strain: Low Risk   ? Difficulty of Paying Living Expenses: Not hard at all  ?Food Insecurity: No Food Insecurity  ? Worried About Charity fundraiser in the Last Year: Never true  ? Ran Out of Food in the Last Year: Never  true  ?Transportation Needs: No Transportation Needs  ? Lack of Transportation (Medical): No  ? Lack of Transportation (Non-Medical): No  ?Physical Activity: Inactive  ? Days of Exercise per Week: 0 days  ? Minutes of Exercise per Session: 0 min  ?Stress: No Stress Concern Present  ? Feeling of Stress : Not at all  ?Social Connections: Socially Integrated  ? Frequency of Communication with Friends and Family: More than three times a week  ? Frequency of Social Gatherings with Friends and Family: More than three times a week  ? Attends Religious Services: More than 4 times per year  ? Active Member of Clubs or Organizations: Yes  ? Attends Archivist Meetings: More than 4 times per year  ? Marital Status: Married  ? ? ?Tobacco Counseling ?Ready to quit: Not Answered ?Counseling given: Not Answered ? ? ?Clinical Intake: ? ?Pre-visit preparation completed: Yes ? ?Pain : 0-10 ?Pain Score: 5  ?Pain Type: Chronic pain ?Pain Location: Back ?Pain Descriptors / Indicators: Aching, Dull ?Pain Onset: More than a month ago ?Pain Frequency: Intermittent ? ?  ? ?BMI - recorded: 28.21 ?Nutritional Status: BMI 25 -29 Overweight ?Nutritional Risks: None ?Diabetes: No ? ?How often do you need to have someone help you when you read instructions, pamphlets, or other written materials from your doctor or pharmacy?: 1 - Never ? ?Diabetic?NO ? ?Interpreter Needed?: No ? ?Information entered by :: mj Kortlynn Poust, lpn ? ? ?Activities  of Daily Living ? ?  05/26/2021  ?  9:10 AM  ?In your present state of health, do you have any difficulty performing the following activities:  ?Hearing? 0  ?Vision? 0  ?Difficulty concentrating or making d

## 2021-06-09 DIAGNOSIS — G20 Parkinson's disease: Secondary | ICD-10-CM | POA: Diagnosis not present

## 2021-06-09 DIAGNOSIS — Z9682 Presence of neurostimulator: Secondary | ICD-10-CM | POA: Diagnosis not present

## 2021-06-09 DIAGNOSIS — Z4549 Encounter for adjustment and management of other implanted nervous system device: Secondary | ICD-10-CM | POA: Diagnosis not present

## 2021-06-09 DIAGNOSIS — R471 Dysarthria and anarthria: Secondary | ICD-10-CM | POA: Diagnosis not present

## 2021-06-09 DIAGNOSIS — Z79899 Other long term (current) drug therapy: Secondary | ICD-10-CM | POA: Diagnosis not present

## 2021-07-05 DIAGNOSIS — G20 Parkinson's disease: Secondary | ICD-10-CM | POA: Diagnosis not present

## 2021-07-05 DIAGNOSIS — R471 Dysarthria and anarthria: Secondary | ICD-10-CM | POA: Diagnosis not present

## 2021-07-12 DIAGNOSIS — Z79899 Other long term (current) drug therapy: Secondary | ICD-10-CM | POA: Diagnosis not present

## 2021-07-12 DIAGNOSIS — G20 Parkinson's disease: Secondary | ICD-10-CM | POA: Diagnosis not present

## 2021-07-12 DIAGNOSIS — R278 Other lack of coordination: Secondary | ICD-10-CM | POA: Diagnosis not present

## 2021-07-12 DIAGNOSIS — Z9682 Presence of neurostimulator: Secondary | ICD-10-CM | POA: Diagnosis not present

## 2021-07-13 ENCOUNTER — Other Ambulatory Visit: Payer: Self-pay

## 2021-07-15 ENCOUNTER — Encounter: Payer: Self-pay | Admitting: Cardiology

## 2021-07-15 ENCOUNTER — Ambulatory Visit: Payer: Medicare Other | Admitting: Cardiology

## 2021-07-15 VITALS — BP 108/68 | HR 108 | Ht 72.0 in | Wt 208.8 lb

## 2021-07-15 DIAGNOSIS — I480 Paroxysmal atrial fibrillation: Secondary | ICD-10-CM | POA: Diagnosis not present

## 2021-07-15 DIAGNOSIS — Z9689 Presence of other specified functional implants: Secondary | ICD-10-CM | POA: Diagnosis not present

## 2021-07-15 DIAGNOSIS — G20 Parkinson's disease: Secondary | ICD-10-CM | POA: Diagnosis not present

## 2021-07-15 NOTE — Progress Notes (Signed)
Cardiology Office Note:    Date:  07/15/2021   ID:  BUCK MCAFFEE, DOB Oct 08, 1955, MRN [id number]  PCP:  Dettinger, Fransisca Kaufmann, MD  Cardiologist:  Jenean Lindau, MD   Referring MD: Dettinger, Fransisca Kaufmann, MD    ASSESSMENT:    1. Paroxysmal atrial fibrillation (HCC)   2. Parkinson's disease ([place])   3. S/P deep brain stimulator placement    PLAN:    In order of problems listed above:  Primary prevention stressed with the patient.  Importance of compliance with diet medication stressed any vocalized understanding. Paroxysmal atrial fibrillation: Patient currently is in atrial fibrillation.  I discussed this with him at length.  Rhythm control issues was recommended.  I suggested Multaq and he is agreeable.  We will do a Chem-7 and liver panel today.  If it is fine we will initiate him on Multaq and follow-up accordingly.  Echocardiogram will also be done to assess left atrial size and left ventricular systolic function normal other issues.  He is agreeable.  He knows to go to the nearest emergency room for any concerning symptoms.Patient will be seen in follow-up appointment in 4 weeks or earlier if the patient has any concerns. S/p brain stimulator implant for Parkinson's: Managed by the specialists.    Medication Adjustments/Labs and Tests Ordered: Current medicines are reviewed at length with the patient today.  Concerns regarding medicines are outlined above.  No orders of the defined types were placed in this encounter.  No orders of the defined types were placed in this encounter.    No chief complaint on file.    History of Present Illness:    John Norton is a 66 y.o. male.  Patient has past medical history of paroxysmal atrial fibrillation, Parkinson's, left bundle branch block and deep brain stimulator placement he denies any problems at this time and takes care of activities of daily living.  He has significant issues with Parkinson's.  He has not done  anticoagulation because his CHA2DS2-VASc score is 0.  At the time of my evaluation, the patient is alert awake oriented and in no distress.  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   Atrial fibrillation ([place]) 2019   BPH (benign prostatic hyperplasia) 05/07/2020   Degeneration of lumbar intervertebral disc 12/19/2017   ED (erectile dysfunction)    GERD (gastroesophageal reflux disease) 05/07/2020   Head pain cephalgia 03/04/2016   History of kidney stones YRS AGO   LBBB (left bundle branch block)    Left bundle branch block 05/14/2020   Neuropathy    FOREHEAD BURNING AND NUMBNESS   Parkinson's disease ([place]) 12/30/2015   Paroxysmal atrial fibrillation ([place]) 10/20/2016   S/P deep brain stimulator placement 05/27/2020   Scoliosis deformity of spine 10/11/2017   Sleep apnea    does not use cpap, NO SLEEP APNEA SINCE QUIT SMOKING 2012   Spinal stenosis    Spinal stenosis at L4-L5 level 11/03/2016   Spinal stenosis of lumbar region 11/03/2016   Tremors of nervous system     Past Surgical History:  Procedure Laterality Date   "spot on lung"  SEVERAL YRS AGO   SPOT [facility] AWAY   CHEST X-RAY  08/21/10   No cardiopulmonary process noted.   CHOLECYSTECTOMY N/A 04/20/2012   Procedure: LAPAROSCOPIC CHOLECYSTECTOMY;  Surgeon: Jamesetta So, MD;  Location: [facility];  Service: General;  Laterality: N/A;   COLONOSCOPY  2013   [place]  LEXISCAN MYOVIEW  08/22/10    There is no stress-induced ischemia,  but a fixed deficit in the lateral apex and inferior wall.  Ejection fraction 51%   LUMBAR LAMINECTOMY/DECOMPRESSION MICRODISCECTOMY N/A 11/03/2016   Procedure: Microlumbar Decompression L4-5, L5-S1;  Surgeon: Susa Day, MD;  Location: [facility];  Service: Orthopedics;  Laterality: N/A;  120 mins   POLYPECTOMY  2013   TA's   THYROID LOBECTOMY     RIGHT THYROID    Current Medications: Current Meds  Medication Sig   amantadine (SYMMETREL) 100 MG  capsule Take 100 mg by mouth 3 (three) times daily.   Ascorbic Acid (VITAMIN C) 100 MG tablet Take 100 mg by mouth daily.   aspirin EC 81 MG tablet Take 81 mg by mouth daily.    Carbidopa-Levodopa ER (SINEMET CR) 25-100 MG tablet controlled release Take 1 tablet by mouth daily.   famotidine (PEPCID) 20 MG tablet Take 1 tablet (20 mg total) by mouth 2 (two) times daily.   ibuprofen (ADVIL,MOTRIN) 200 MG tablet Take 3 tablets (600 mg total) by mouth every 8 (eight) hours as needed for headache or mild pain. Resume 5 days post-op as needed   Multiple Vitamin (MULTI-VITAMINS) TABS Take 1 tablet by mouth daily.    pregabalin (LYRICA) 200 MG capsule Take 1 capsule (200 mg total) by mouth 2 (two) times daily.   propranolol ER (INDERAL LA) 60 MG 24 hr capsule Take 60 mg by mouth daily.   sildenafil (VIAGRA) 100 MG tablet Take 100 mg by mouth daily as needed for erectile dysfunction.   tamsulosin (FLOMAX) 0.4 MG CAPS capsule Take 1 capsule (0.4 mg total) by mouth daily.     Allergies:   Codeine   Social History   Socioeconomic History   Marital status: Married    Spouse name: Tye [place]   Number of children: 1   Years of education: 12   Highest education level: 12th grade  Occupational History   Occupation: disability  Tobacco Use   Smoking status: Former    Packs/day: 1.50    Years: 40.00    Pack years: 60.00    Types: Cigarettes    Quit date: 07/30/2010    Years since quitting: 10.9   Smokeless tobacco: Current    Types: Snuff  Vaping Use   Vaping Use: Never used  Substance and Sexual Activity   Alcohol use: No   Drug use: No   Sexual activity: Not on file  Other Topics Concern   Not on file  Social History Narrative   Lives with wife in a one story home.  Has one daughter.  On disability.  Education: 12th   Social Determinants of Health   Financial Resource Strain: Low Risk    Difficulty of Paying Living Expenses: Not hard at all  Food Insecurity: No Food Insecurity   Worried  About Charity fundraiser in the Last Year: Never true   Arboriculturist in the Last Year: Never true  Transportation Needs: No Transportation Needs   Lack of Transportation (Medical): No   Lack of Transportation (Non-Medical): No  Physical Activity: Inactive   Days of Exercise per Week: 0 days   Minutes of Exercise per Session: 0 min  Stress: No Stress Concern Present   Feeling of Stress : Not at all  Social Connections: Socially Integrated   Frequency of Communication with Friends and Family: More than three times a week   Frequency of Social Gatherings with Friends and Family:  More than three times a week   Attends Religious Services: More than 4 times per year   Active Member of Clubs or Organizations: Yes   Attends Archivist Meetings: More than 4 times per year   Marital Status: Married     Family History: The patient's family history includes Colon polyps in his brother; Diabetes in his mother; Glaucoma in his mother; Hypertension in his brother, daughter, and mother. There is no history of Colon cancer, Rectal cancer, Stomach cancer, or Esophageal cancer.  ROS:   Please see the history of present illness.    All other systems reviewed and are negative.  EKGs/Labs/Other Studies Reviewed:    The following studies were reviewed today: EKG reveals atrial fibrillation with mildly elevated ventricular rate.   Recent Labs: 05/10/2021: ALT 7; BUN 16; Creatinine, Ser 1.03; Potassium 4.1; Sodium 141  Recent Lipid Panel    Component Value Date/Time   CHOL 171 05/10/2021 1031   TRIG 204 (H) 05/10/2021 1031   HDL 29 (L) 05/10/2021 1031   CHOLHDL 5.9 (H) 05/10/2021 1031   CHOLHDL 6.7 08/22/2010 0650   VLDL 49 (H) 08/22/2010 0650   LDLCALC 106 (H) 05/10/2021 1031    Physical Exam:    VS:  BP 108/68   Pulse (!) 108   Ht 6' (1.829 m)   Wt 208 lb 12.8 oz (94.7 kg)   SpO2 97%   BMI 28.32 kg/m     Wt Readings from Last 3 Encounters:  07/15/21 208 lb 12.8 oz (94.7  kg)  05/26/21 208 lb (94.3 kg)  05/10/21 208 lb (94.3 kg)     GEN: Patient is in no acute distress HEENT: Normal NECK: No JVD; No carotid bruits LYMPHATICS: No lymphadenopathy CARDIAC: Hear sounds regular, 2/6 systolic murmur at the apex. RESPIRATORY:  Clear to auscultation without rales, wheezing or rhonchi  ABDOMEN: Soft, non-tender, non-distended MUSCULOSKELETAL:  No edema; No deformity  SKIN: Warm and dry NEUROLOGIC:  Alert and oriented x 3 PSYCHIATRIC:  Normal affect   Signed, Jenean Lindau, MD  07/15/2021 9:33 AM    [place]

## 2021-07-15 NOTE — Patient Instructions (Signed)
Medication Instructions:  Your physician has recommended you make the following change in your medication:   If your labs are normal, we will start you on Multaq 400 mg daily.  *If you need a refill on your cardiac medications before your next appointment, please call your pharmacy*   Lab Work: Your physician recommends that you have labs done in the office today. Your test included  basic metabolic panel and liver function.   [address] in [place]. They also close daily for lunch for 12-1.   [address] 2nd floor M-W 8-11:30 and 1-4:30 and Thursday and Friday 8-11:30.  If you have labs (blood work) drawn today and your tests are completely normal, you will receive your results only by: [place] (if you have MyChart) OR A paper copy in the mail If you have any lab test that is abnormal or we need to change your treatment, we will call you to review the results.   Testing/Procedures: Your physician has requested that you have an echocardiogram. Echocardiography is a painless test that uses sound waves to create images of your heart. It provides your doctor with information about the size and shape of your heart and how well your heart's chambers and valves are working. This procedure takes approximately one hour. There are no restrictions for this procedure.    Follow-Up: At [facility], you and your health needs are our priority.  As part of our continuing mission to provide you with exceptional heart care, we have created designated Provider Care Teams.  These Care Teams include your primary Cardiologist (physician) and Advanced Practice Providers (APPs -  Physician Assistants and Nurse Practitioners) who all work together to provide you with the care you need, when you need it.  We recommend signing up for the patient portal called "MyChart".  Sign up information is provided on this After Visit Summary.  MyChart is used to connect  with patients for Virtual Visits (Telemedicine).  Patients are able to view lab/test results, encounter notes, upcoming appointments, etc.  Non-urgent messages can be sent to your provider as well.   To learn more about what you can do with MyChart, go to [email or web address].    Your next appointment:   1 month(s)  The format for your next appointment:   In Person  Provider:   Jyl Heinz, MD   Other Instructions Echocardiogram An echocardiogram is a test that uses sound waves (ultrasound) to produce images of the heart. Images from an echocardiogram can provide important information about: Heart size and shape. The size and thickness and movement of your heart's walls. Heart muscle function and strength. Heart valve function or if you have stenosis. Stenosis is when the heart valves are too narrow. If blood is flowing backward through the heart valves (regurgitation). A tumor or infectious growth around the heart valves. Areas of heart muscle that are not working well because of poor blood flow or injury from a heart attack. Aneurysm detection. An aneurysm is a weak or damaged part of an artery wall. The wall bulges out from the normal force of blood pumping through the body. Tell a health care provider about: Any allergies you have. All medicines you are taking, including vitamins, herbs, eye drops, creams, and over-the-counter medicines. Any blood disorders you have. Any surgeries you have had. Any medical conditions you have. Whether you are pregnant or may be pregnant. What are the risks? Generally, this is a safe test. However,  problems may occur, including an allergic reaction to dye (contrast) that may be used during the test. What happens before the test? No specific preparation is needed. You may eat and drink normally. What happens during the test? You will take off your clothes from the waist up and put on a hospital gown. Electrodes or electrocardiogram  (ECG)patches may be placed on your chest. The electrodes or patches are then connected to a device that monitors your heart rate and rhythm. You will lie down on a table for an ultrasound exam. A gel will be applied to your chest to help sound waves pass through your skin. A handheld device, called a transducer, will be pressed against your chest and moved over your heart. The transducer produces sound waves that travel to your heart and bounce back (or "echo" back) to the transducer. These sound waves will be captured in real-time and changed into images of your heart that can be viewed on a video monitor. The images will be recorded on a computer and reviewed by your health care provider. You may be asked to change positions or hold your breath for a short time. This makes it easier to get different views or better views of your heart. In some cases, you may receive contrast through an IV in one of your veins. This can improve the quality of the pictures from your heart. The procedure may vary among health care providers and hospitals.   What can I expect after the test? You may return to your normal, everyday life, including diet, activities, and medicines, unless your health care provider tells you not to do that. Follow these instructions at home: It is up to you to get the results of your test. Ask your health care provider, or the department that is doing the test, when your results will be ready. Keep all follow-up visits. This is important. Summary An echocardiogram is a test that uses sound waves (ultrasound) to produce images of the heart. Images from an echocardiogram can provide important information about the size and shape of your heart, heart muscle function, heart valve function, and other possible heart problems. You do not need to do anything to prepare before this test. You may eat and drink normally. After the echocardiogram is completed, you may return to your normal, everyday  life, unless your health care provider tells you not to do that. This information is not intended to replace advice given to you by your health care provider. Make sure you discuss any questions you have with your health care provider. Document Revised: 10/08/2019 Document Reviewed: 10/08/2019 Elsevier Patient Education  2021 [employer].

## 2021-07-16 LAB — HEPATIC FUNCTION PANEL
ALT: 7 IU/L (ref 0–44)
AST: 17 IU/L (ref 0–40)
Albumin: 4.5 g/dL (ref 3.8–4.8)
Alkaline Phosphatase: 102 IU/L (ref 44–121)
Bilirubin Total: 0.5 mg/dL (ref 0.0–1.2)
Bilirubin, Direct: 0.16 mg/dL (ref 0.00–0.40)
Total Protein: 6.8 g/dL (ref 6.0–8.5)

## 2021-07-16 LAB — BASIC METABOLIC PANEL WITH GFR
BUN/Creatinine Ratio: 16 (ref 10–24)
BUN: 17 mg/dL (ref 8–27)
CO2: 21 mmol/L (ref 20–29)
Calcium: 8.9 mg/dL (ref 8.6–10.2)
Chloride: 106 mmol/L (ref 96–106)
Creatinine, Ser: 1.09 mg/dL (ref 0.76–1.27)
Glucose: 178 mg/dL — ABNORMAL HIGH (ref 70–99)
Potassium: 4.1 mmol/L (ref 3.5–5.2)
Sodium: 144 mmol/L (ref 134–144)
eGFR: 75 mL/min/1.73

## 2021-07-21 ENCOUNTER — Ambulatory Visit (HOSPITAL_BASED_OUTPATIENT_CLINIC_OR_DEPARTMENT_OTHER)
Admission: RE | Admit: 2021-07-21 | Discharge: 2021-07-21 | Disposition: A | Discharge status: Home / Self Care | Payer: Medicare Other | Source: Ambulatory Visit | Attending: Cardiology | Admitting: Cardiology

## 2021-07-21 DIAGNOSIS — I480 Paroxysmal atrial fibrillation: Secondary | ICD-10-CM | POA: Diagnosis not present

## 2021-07-21 LAB — ECHOCARDIOGRAM COMPLETE
AR max vel: 1.58 cm2
AV Area VTI: 1.38 cm2
AV Area mean vel: 1.49 cm2
AV Mean grad: 5 mmHg
AV Peak grad: 9.7 mmHg
Ao pk vel: 1.56 m/s
Calc EF: 43.9 %
S' Lateral: 3.9 cm
Single Plane A2C EF: 41.1 %
Single Plane A4C EF: 48.2 %

## 2021-07-21 MED ORDER — PERFLUTREN LIPID MICROSPHERE
1.0000 mL | INTRAVENOUS | Status: AC | PRN
  Administered 2021-07-21: 2 mL via INTRAVENOUS

## 2021-07-21 NOTE — Progress Notes (Incomplete)
  Echocardiogram 2D Echocardiogram with contrast has been performed.  Merrie Roof F 07/21/2021, 9:41 AM

## 2021-08-03 ENCOUNTER — Ambulatory Visit (INDEPENDENT_AMBULATORY_CARE_PROVIDER_SITE_OTHER): Payer: Medicare Other | Admitting: Nurse Practitioner

## 2021-08-03 ENCOUNTER — Encounter: Payer: Self-pay | Admitting: Nurse Practitioner

## 2021-08-03 DIAGNOSIS — R197 Diarrhea, unspecified: Secondary | ICD-10-CM | POA: Diagnosis not present

## 2021-08-03 DIAGNOSIS — R110 Nausea: Secondary | ICD-10-CM | POA: Diagnosis not present

## 2021-08-03 MED ORDER — ONDANSETRON HCL 4 MG PO TABS: 4.0000 mg | ORAL_TABLET | Freq: Three times a day (TID) | ORAL | 0 refills | Status: AC | PRN

## 2021-08-03 NOTE — Progress Notes (Addendum)
   Virtual Visit  Note Due to COVID-19 pandemic this visit was conducted virtually. This visit type was conducted due to national recommendations for restrictions regarding the COVID-19 Pandemic (e.g. social distancing, sheltering in place) in an effort to limit this patient's exposure and mitigate transmission in our community. All issues noted in this document were discussed and addressed.  A physical exam was not performed with this format.  I connected with John Norton on 08/06/21 at 1:30 pm  by telephone and verified that I am speaking with the correct person using two identifiers. John Norton is currently located at home during visit. The provider, Ivy Lynn, NP is located in their office at time of visit.  I discussed the limitations, risks, security and privacy concerns of performing an evaluation and management service by telephone and the availability of in person appointments. I also discussed with the patient that there may be a patient responsible charge related to this service. The patient expressed understanding and agreed to proceed.   History and Present Illness:  Diarrhea  This is a new problem. The current episode started in the past 7 days. The problem occurs 2 to 4 times per day. The problem has been unchanged. The stool consistency is described as Watery. The patient states that diarrhea does not awaken him from sleep. Associated symptoms include abdominal pain. Pertinent negatives include no chills, fever, increased  flatus or sweats. Nothing aggravates the symptoms. There are no known risk factors. He has tried anti-motility drug for the symptoms. The treatment provided mild relief. There is no history of bowel resection.      Review of Systems  Constitutional: Negative.  Negative for chills and fever.  HENT: Negative.    Cardiovascular: Negative.   Gastrointestinal:  Positive for abdominal pain, diarrhea and nausea. Negative for flatus.  Skin: Negative.   Negative for rash.  All other systems reviewed and are negative.    Observations/Objective: Tele-visit, patient not in distress  Assessment and Plan: Take medication as prescribed -Brat diet recommended -Increase hydration -Avoid greasy, spicy and fatty foods. -Imodium 4 mg tablet by mouth as needed for diarrhea -Zofran 4 mg tablet by mouth as needed for nausea Stool cultures completed results pending.   Follow Up Instructions: Follow up wit worsening unresolved symptoms    I discussed the assessment and treatment plan with the patient. The patient was provided an opportunity to ask questions and all were answered. The patient agreed with the plan and demonstrated an understanding of the instructions.   The patient was advised to call back or seek an in-person evaluation if the symptoms worsen or if the condition fails to improve as anticipated.  The above assessment and management plan was discussed with the patient. The patient verbalized understanding of and has agreed to the management plan. Patient is aware to call the clinic if symptoms persist or worsen. Patient is aware when to return to the clinic for a follow-up visit. Patient educated on when it is appropriate to go to the emergency department.   Time call ended:  1;41 pm   I provided 11 minutes of  non face-to-face time during this encounter.    Ivy Lynn, NP

## 2021-08-16 ENCOUNTER — Telehealth: Payer: Self-pay | Admitting: Family Medicine

## 2021-08-16 DIAGNOSIS — R197 Diarrhea, unspecified: Secondary | ICD-10-CM

## 2021-08-16 NOTE — Telephone Encounter (Signed)
Per wife, specimen was mailed to " a lab". She believes it was [facility]. Per our lab it was probably mailed to the [place] location and has never been processed. ( Probably in a mailbox) per lab.  New order placed and wife will come pick upi kit tomorrow am and aware to bring back here.

## 2021-08-17 ENCOUNTER — Telehealth: Payer: Self-pay

## 2021-08-17 DIAGNOSIS — I480 Paroxysmal atrial fibrillation: Secondary | ICD-10-CM

## 2021-08-17 NOTE — Telephone Encounter (Signed)
Patient had an appointment for this Thursday (6/22) but needed to cancel because he has a dentist appointment. Patient said he saw his echo results on his mychart but it doesn't make any sense. Could he get a phone call with the results instead of making another appointment? CB # [phone]

## 2021-08-19 ENCOUNTER — Ambulatory Visit: Payer: Medicare Other | Admitting: Cardiology

## 2021-08-19 MED ORDER — DRONEDARONE HCL 400 MG PO TABS: 400.0000 mg | ORAL_TABLET | Freq: Two times a day (BID) | ORAL | 3 refills | Status: DC

## 2021-08-19 NOTE — Telephone Encounter (Signed)
Spoke with pt. He was waiting on his BW and ECHO before starting his new medication Multaq. He wasn't sure if he should be waiting until his Aug appt to discuss the results and medication.

## 2021-08-19 NOTE — Telephone Encounter (Signed)
Left VM for pt to call back.

## 2021-08-19 NOTE — Addendum Note (Signed)
Addended by: Truddie Hidden on: 08/19/2021 12:00 PM   Modules accepted: Orders

## 2021-08-23 ENCOUNTER — Other Ambulatory Visit: Payer: Self-pay | Admitting: Nurse Practitioner

## 2021-08-23 ENCOUNTER — Other Ambulatory Visit: Payer: Medicare Other

## 2021-08-23 ENCOUNTER — Other Ambulatory Visit: Payer: Self-pay | Admitting: Family Medicine

## 2021-08-23 DIAGNOSIS — R197 Diarrhea, unspecified: Secondary | ICD-10-CM

## 2021-08-24 ENCOUNTER — Telehealth: Payer: Self-pay

## 2021-08-24 NOTE — Telephone Encounter (Signed)
Pt request to discuss information at an appointment. Appointment being rescheduled.

## 2021-09-09 DIAGNOSIS — T148XXA Other injury of unspecified body region, initial encounter: Secondary | ICD-10-CM | POA: Diagnosis not present

## 2021-09-09 DIAGNOSIS — S91001A Unspecified open wound, right ankle, initial encounter: Secondary | ICD-10-CM | POA: Diagnosis not present

## 2021-09-13 DIAGNOSIS — Z79899 Other long term (current) drug therapy: Secondary | ICD-10-CM | POA: Diagnosis not present

## 2021-09-13 DIAGNOSIS — G20 Parkinson's disease: Secondary | ICD-10-CM | POA: Diagnosis not present

## 2021-09-13 DIAGNOSIS — Z9682 Presence of neurostimulator: Secondary | ICD-10-CM | POA: Diagnosis not present

## 2021-09-15 DIAGNOSIS — L97912 Non-pressure chronic ulcer of unspecified part of right lower leg with fat layer exposed: Secondary | ICD-10-CM

## 2021-09-15 HISTORY — DX: Non-pressure chronic ulcer of unspecified part of right lower leg with fat layer exposed: L97.912

## 2021-09-21 DIAGNOSIS — L97912 Non-pressure chronic ulcer of unspecified part of right lower leg with fat layer exposed: Secondary | ICD-10-CM | POA: Diagnosis not present

## 2021-09-21 DIAGNOSIS — I447 Left bundle-branch block, unspecified: Secondary | ICD-10-CM | POA: Diagnosis not present

## 2021-09-21 DIAGNOSIS — I872 Venous insufficiency (chronic) (peripheral): Secondary | ICD-10-CM

## 2021-09-21 DIAGNOSIS — G20 Parkinson's disease: Secondary | ICD-10-CM | POA: Diagnosis not present

## 2021-09-21 DIAGNOSIS — I480 Paroxysmal atrial fibrillation: Secondary | ICD-10-CM | POA: Diagnosis not present

## 2021-09-21 HISTORY — DX: Venous insufficiency (chronic) (peripheral): I87.2

## 2021-09-29 ENCOUNTER — Ambulatory Visit: Payer: Medicare Other | Admitting: Cardiology

## 2021-10-05 DIAGNOSIS — L97912 Non-pressure chronic ulcer of unspecified part of right lower leg with fat layer exposed: Secondary | ICD-10-CM | POA: Diagnosis not present

## 2021-10-05 DIAGNOSIS — G20 Parkinson's disease: Secondary | ICD-10-CM | POA: Diagnosis not present

## 2021-10-05 DIAGNOSIS — I83018 Varicose veins of right lower extremity with ulcer other part of lower leg: Secondary | ICD-10-CM | POA: Diagnosis not present

## 2021-10-05 DIAGNOSIS — I872 Venous insufficiency (chronic) (peripheral): Secondary | ICD-10-CM | POA: Diagnosis not present

## 2021-10-05 DIAGNOSIS — I480 Paroxysmal atrial fibrillation: Secondary | ICD-10-CM | POA: Diagnosis not present

## 2021-10-05 DIAGNOSIS — Z87891 Personal history of nicotine dependence: Secondary | ICD-10-CM | POA: Diagnosis not present

## 2021-10-05 DIAGNOSIS — L97812 Non-pressure chronic ulcer of other part of right lower leg with fat layer exposed: Secondary | ICD-10-CM | POA: Diagnosis not present

## 2021-10-05 DIAGNOSIS — I447 Left bundle-branch block, unspecified: Secondary | ICD-10-CM | POA: Diagnosis not present

## 2021-10-05 DIAGNOSIS — Z79899 Other long term (current) drug therapy: Secondary | ICD-10-CM | POA: Diagnosis not present

## 2021-10-08 ENCOUNTER — Ambulatory Visit: Payer: Medicare Other | Admitting: Cardiology

## 2021-10-12 DIAGNOSIS — R600 Localized edema: Secondary | ICD-10-CM | POA: Diagnosis not present

## 2021-10-12 DIAGNOSIS — L97812 Non-pressure chronic ulcer of other part of right lower leg with fat layer exposed: Secondary | ICD-10-CM | POA: Diagnosis not present

## 2021-10-12 DIAGNOSIS — I83018 Varicose veins of right lower extremity with ulcer other part of lower leg: Secondary | ICD-10-CM | POA: Diagnosis not present

## 2021-10-12 DIAGNOSIS — L97912 Non-pressure chronic ulcer of unspecified part of right lower leg with fat layer exposed: Secondary | ICD-10-CM | POA: Diagnosis not present

## 2021-10-12 DIAGNOSIS — G20 Parkinson's disease: Secondary | ICD-10-CM | POA: Diagnosis not present

## 2021-10-12 DIAGNOSIS — I872 Venous insufficiency (chronic) (peripheral): Secondary | ICD-10-CM | POA: Diagnosis not present

## 2021-10-13 ENCOUNTER — Encounter: Payer: Self-pay | Admitting: Family Medicine

## 2021-10-13 ENCOUNTER — Ambulatory Visit (INDEPENDENT_AMBULATORY_CARE_PROVIDER_SITE_OTHER): Payer: Medicare Other | Admitting: Family Medicine

## 2021-10-13 VITALS — BP 133/72 | HR 60 | Temp 97.6°F | Ht 72.0 in | Wt 205.2 lb

## 2021-10-13 DIAGNOSIS — R7309 Other abnormal glucose: Secondary | ICD-10-CM

## 2021-10-13 LAB — BAYER DCA HB A1C WAIVED: HB A1C (BAYER DCA - WAIVED): 5.6 % (ref 4.8–5.6)

## 2021-10-13 NOTE — Progress Notes (Signed)
Subjective:  Patient ID: John Norton, male    DOB: 12-Apr-1955  Age: 66 y.o. MRN: [id number]  CC: Wound Check (Right lower leg)   HPI John Norton presents for concern for diabetes.  John Norton has an ulcer at the right ankle.  This has been getting larger and more red.  John Norton has been referred to the wound care center.  They are having him do dressing at home with Dakin's solution.  They looked at it yesterday and did not comment about some redness that was spreading from the edge of it.  They did start him on sulfa-based antibiotics however.  They had left it wrapped for an entire week at the suggestion of the wound clinic.  Now they have started him on the Dakin's dressings daily.  John Norton wife is concerned that they might be missing something.  She had a daughter die of Ewing sarcoma at age 46 as a result of missed diagnosis.  She wants to be sure that they have not missed something this time.  Specifically here she wants to have him checked for diabetes.  Additionally she states that the wound center is planning to do a wound biopsy for signs of cancer on Tuesday of next week.  The patient himself is trying to follow a high-protein diet.  John Norton says it started as a red bleb or blister.  It has spread and gotten deeper since then.  John Norton is wondering if it might of been a brown recluse spider bite.     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) (10/20/2016), S/P deep brain stimulator placement (05/27/2020), Scoliosis deformity of spine (10/11/2017), Sleep apnea, Spinal stenosis, Spinal stenosis at L4-L5 level (11/03/2016), Spinal stenosis of lumbar region (11/03/2016), and Tremors of nervous system.   John Norton has a past surgical history that includes Thyroid lobectomy; LEXISCAN MYOVIEW (08/22/10); CHEST X-RAY (08/21/10); Inguinal hernia repair (1998); "spot on lung" (SEVERAL YRS AGO); Cholecystectomy (N/A, 04/20/2012); Lumbar laminectomy/decompression microdiscectomy (N/A, 11/03/2016); Colonoscopy (2013); and Polypectomy (2013).   John Norton family history includes Colon polyps in John Norton brother; Diabetes in John Norton mother; Glaucoma in John Norton mother; Hypertension in John Norton brother, daughter, and mother.John Norton reports that John Norton quit smoking about 11 years ago. John Norton smoking use included cigarettes. John Norton has a 60.00 pack-year smoking history. John Norton smokeless tobacco use includes snuff. John Norton reports that John Norton does not drink alcohol and does not use drugs.    ROS Review of Systems  Constitutional:  Negative for appetite change, fatigue and fever.  HENT: Negative.  Gastrointestinal: Negative.     Objective:  BP 133/72   Pulse 60   Temp 97.6 F (36.4 C)   Ht 6' (1.829 m)   Wt 205 lb 3.2 oz (93.1 kg)   SpO2 98%   BMI 27.83 kg/m   BP Readings from Last 3 Encounters:  10/13/21 133/72  07/15/21 108/68  05/10/21 138/76    Wt Readings from Last 3 Encounters:  10/13/21 205 lb 3.2 oz (93.1 kg)  07/15/21 208 lb 12.8 oz (94.7 kg)  05/26/21 208 lb (94.3 kg)     Physical Exam Constitutional:       General: John Norton is not in acute distress.    Appearance: John Norton is normal weight. John Norton is ill-appearing. John Norton is not toxic-appearing.     Comments: Patient is distraught but in no acute distress  HENT:     Head: Normocephalic and atraumatic.  Skin:    General: Skin is warm and dry.     Findings: Lesion (The wound is approx.  3-1/2 cm diameter. It is located 4 inches above the right lateral malleolus.  It does go down into the subcutaneous tissue to the muscle layer.  Much of the bed is covered with slough making it difficult to ascertain.) present.  Neurological:     Mental Status: John Norton is alert.    Results for orders placed or performed in visit on 10/13/21  Bayer DCA Hb A1c Waived  Result Value Ref Range   HB A1C (BAYER DCA - WAIVED) 5.6 4.8 - 5.6 %      Assessment & Plan:   John Norton was seen today for wound check.  Diagnoses and all orders for this visit:  Elevated glucose -     Bayer DCA Hb A1c Waived       I am having [facility] L. [facility] "Tim" maintain John Norton sildenafil, ibuprofen, propranolol ER, pregabalin, amantadine, aspirin EC, Multi-Vitamins, vitamin C, famotidine, tamsulosin, Carbidopa-Levodopa ER, ondansetron, and dronedarone.  Allergies as of 10/13/2021       Reactions   Codeine Nausea Only        Medication List        Accurate as of October 13, 2021 10:36 AM. If you have any questions, ask your nurse or doctor.          amantadine 100 MG capsule Commonly known as: SYMMETREL Take 100 mg by mouth 3 (three) times daily.   aspirin EC 81 MG tablet Take 81 mg by mouth daily.   Carbidopa-Levodopa ER 25-100 MG tablet controlled release Commonly known as: SINEMET CR Take 1 tablet by mouth daily.   dronedarone 400 MG tablet Commonly known as: MULTAQ Take 1 tablet (400 mg total) by mouth 2 (two) times daily with a meal.   famotidine 20 MG tablet Commonly known as: PEPCID Take 1 tablet (20 mg total) by mouth 2 (two) times daily.   ibuprofen 200 MG  tablet Commonly known as: ADVIL Take 3 tablets (600 mg total) by mouth every 8 (eight) hours as needed for headache or mild pain. Resume 5 days post-op as needed   Multi-Vitamins Tabs Take 1 tablet by mouth daily.   ondansetron 4 MG tablet Commonly known as: Zofran Take 1 tablet (4 mg total) by mouth every 8 (eight) hours as needed for nausea or vomiting.   pregabalin 200 MG capsule Commonly known as: Lyrica Take 1 capsule (200 mg total) by mouth 2 (two) times daily.   propranolol ER 60 MG 24 hr capsule Commonly known as: INDERAL LA  Take 60 mg by mouth daily.   sildenafil 100 MG tablet Commonly known as: VIAGRA Take 100 mg by mouth daily as needed for erectile dysfunction.   tamsulosin 0.4 MG Caps capsule Commonly known as: FLOMAX Take 1 capsule (0.4 mg total) by mouth daily.   vitamin C 100 MG tablet Take 100 mg by mouth daily.         Follow-up: With the wound center. Continue Dakin's dressings as ordered. Reassured that the A1c is normal and diabetes is not causing this condition.   Approximately 40 minutes were spent with this consultation.  More than half of which was spent in discussion of appropriate care and potential causes of the above-mentioned lesion.    Claretta Fraise, M.D.

## 2021-10-19 DIAGNOSIS — L97912 Non-pressure chronic ulcer of unspecified part of right lower leg with fat layer exposed: Secondary | ICD-10-CM | POA: Diagnosis not present

## 2021-10-19 DIAGNOSIS — L97812 Non-pressure chronic ulcer of other part of right lower leg with fat layer exposed: Secondary | ICD-10-CM | POA: Diagnosis not present

## 2021-10-19 DIAGNOSIS — G20 Parkinson's disease: Secondary | ICD-10-CM | POA: Diagnosis not present

## 2021-10-19 DIAGNOSIS — I872 Venous insufficiency (chronic) (peripheral): Secondary | ICD-10-CM | POA: Diagnosis not present

## 2021-10-26 DIAGNOSIS — I872 Venous insufficiency (chronic) (peripheral): Secondary | ICD-10-CM | POA: Diagnosis not present

## 2021-10-26 DIAGNOSIS — G20 Parkinson's disease: Secondary | ICD-10-CM | POA: Diagnosis not present

## 2021-10-26 DIAGNOSIS — L97912 Non-pressure chronic ulcer of unspecified part of right lower leg with fat layer exposed: Secondary | ICD-10-CM | POA: Diagnosis not present

## 2021-10-26 DIAGNOSIS — L97812 Non-pressure chronic ulcer of other part of right lower leg with fat layer exposed: Secondary | ICD-10-CM | POA: Diagnosis not present

## 2021-11-02 DIAGNOSIS — I872 Venous insufficiency (chronic) (peripheral): Secondary | ICD-10-CM | POA: Diagnosis not present

## 2021-11-02 DIAGNOSIS — M792 Neuralgia and neuritis, unspecified: Secondary | ICD-10-CM | POA: Diagnosis not present

## 2021-11-02 DIAGNOSIS — L97912 Non-pressure chronic ulcer of unspecified part of right lower leg with fat layer exposed: Secondary | ICD-10-CM | POA: Diagnosis not present

## 2021-11-02 DIAGNOSIS — L97812 Non-pressure chronic ulcer of other part of right lower leg with fat layer exposed: Secondary | ICD-10-CM | POA: Diagnosis not present

## 2021-11-09 DIAGNOSIS — L97812 Non-pressure chronic ulcer of other part of right lower leg with fat layer exposed: Secondary | ICD-10-CM | POA: Diagnosis not present

## 2021-11-09 DIAGNOSIS — L97912 Non-pressure chronic ulcer of unspecified part of right lower leg with fat layer exposed: Secondary | ICD-10-CM | POA: Diagnosis not present

## 2021-11-09 DIAGNOSIS — I872 Venous insufficiency (chronic) (peripheral): Secondary | ICD-10-CM | POA: Diagnosis not present

## 2021-11-11 DIAGNOSIS — S81811D Laceration without foreign body, right lower leg, subsequent encounter: Secondary | ICD-10-CM | POA: Diagnosis not present

## 2021-11-17 DIAGNOSIS — Z9689 Presence of other specified functional implants: Secondary | ICD-10-CM | POA: Diagnosis not present

## 2021-11-17 DIAGNOSIS — G20 Parkinson's disease: Secondary | ICD-10-CM | POA: Diagnosis not present

## 2021-11-17 DIAGNOSIS — G250 Essential tremor: Secondary | ICD-10-CM | POA: Diagnosis not present

## 2021-11-23 DIAGNOSIS — I447 Left bundle-branch block, unspecified: Secondary | ICD-10-CM | POA: Diagnosis not present

## 2021-11-23 DIAGNOSIS — G20 Parkinson's disease: Secondary | ICD-10-CM | POA: Diagnosis not present

## 2021-11-23 DIAGNOSIS — L97912 Non-pressure chronic ulcer of unspecified part of right lower leg with fat layer exposed: Secondary | ICD-10-CM | POA: Diagnosis not present

## 2021-11-23 DIAGNOSIS — I480 Paroxysmal atrial fibrillation: Secondary | ICD-10-CM | POA: Diagnosis not present

## 2021-11-23 DIAGNOSIS — I872 Venous insufficiency (chronic) (peripheral): Secondary | ICD-10-CM | POA: Diagnosis not present

## 2021-12-07 DIAGNOSIS — I872 Venous insufficiency (chronic) (peripheral): Secondary | ICD-10-CM | POA: Diagnosis not present

## 2021-12-07 DIAGNOSIS — L97912 Non-pressure chronic ulcer of unspecified part of right lower leg with fat layer exposed: Secondary | ICD-10-CM | POA: Diagnosis not present

## 2021-12-08 ENCOUNTER — Ambulatory Visit: Payer: Medicare Other | Attending: Cardiology | Admitting: Cardiology

## 2021-12-08 ENCOUNTER — Encounter: Payer: Self-pay | Admitting: Cardiology

## 2021-12-08 VITALS — BP 138/84 | HR 62 | Ht 72.0 in | Wt 204.0 lb

## 2021-12-08 DIAGNOSIS — G20A1 Parkinson's disease without dyskinesia, without mention of fluctuations: Secondary | ICD-10-CM

## 2021-12-08 DIAGNOSIS — I480 Paroxysmal atrial fibrillation: Secondary | ICD-10-CM | POA: Diagnosis not present

## 2021-12-08 DIAGNOSIS — I447 Left bundle-branch block, unspecified: Secondary | ICD-10-CM

## 2021-12-08 NOTE — Progress Notes (Signed)
Cardiology Office Note:    Date:  12/08/2021   ID:  TYLON KEMMERLING, DOB Sep 25, 1955, MRN [id number]  PCP:  Dettinger, Fransisca Kaufmann, MD  Cardiologist:  Jenean Lindau, MD   Referring MD: Dettinger, Fransisca Kaufmann, MD    ASSESSMENT:    1. Paroxysmal atrial fibrillation (HCC)   2. Left bundle branch block   3. Parkinson's disease, unspecified whether dyskinesia present, unspecified whether manifestations fluctuate    PLAN:    In order of problems listed above:  Primary prevention stressed with the patient.  Importance of compliance with diet medication stressed and he vocalized understanding.  He was advised to ambulate to the best of his ability. Paroxysmal atrial fibrillation:I discussed with the patient atrial fibrillation, disease process. Management and therapy including rate and rhythm control, anticoagulation benefits and potential risks were discussed extensively with the patient. Patient had multiple questions which were answered to patient's satisfaction.  We will try to see if he qualifies for patient assistance program for [employer]. History of Parkinson's: I feel that he is not stable with his diet in view of these applications.  He is very compliant with his medications. Patient will be seen in follow-up appointment in 9 months or earlier if the patient has any concerns    Medication Adjustments/Labs and Tests Ordered: Current medicines are reviewed at length with the patient today.  Concerns regarding medicines are outlined above.  No orders of the defined types were placed in this encounter.  No orders of the defined types were placed in this encounter.    No chief complaint on file.    History of Present Illness:    John Norton is a 66 y.o. male.  Patient has past medical history of paroxysmal atrial fibrillation and Parkinson's disease.  He denies any problems at this time and takes care of activities of daily living.  No chest pain orthopnea or PND.  He mentions  to me that he has not taken Multaq for the past several weeks because it was expensive.  He has not experienced any palpitations or any such issues.  At the time of my evaluation, the patient is alert awake oriented and in no distress.  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) 10/20/2016   S/P deep brain stimulator placement 05/27/2020   Scoliosis deformity of spine 10/11/2017   Sleep apnea    does not use cpap, NO SLEEP APNEA SINCE QUIT SMOKING 2012   Spinal stenosis    Spinal stenosis at L4-L5 level 11/03/2016   Spinal stenosis of lumbar region 11/03/2016   Tremors of nervous system    Venous stasis dermatitis of both lower extremities 09/21/2021    Past Surgical History:  Procedure Laterality Date   "spot on lung"  SEVERAL YRS AGO   SPOT [facility] AWAY   CHEST X-RAY  08/21/10   No cardiopulmonary process noted.   CHOLECYSTECTOMY N/A  04/20/2012   Procedure: LAPAROSCOPIC CHOLECYSTECTOMY;  Surgeon: Jamesetta So, MD;  Location: [facility];  Service: General;  Laterality: N/A;   COLONOSCOPY  2013   [place]  08/22/10    There is no stress-induced ischemia,  but a fixed deficit in the lateral apex and inferior wall.  Ejection fraction 51%   LUMBAR LAMINECTOMY/DECOMPRESSION MICRODISCECTOMY N/A 11/03/2016   Procedure: Microlumbar Decompression L4-5, L5-S1;  Surgeon: Susa Day, MD;  Location: [facility];  Service: Orthopedics;  Laterality: N/A;  120 mins   POLYPECTOMY  2013   TA's   THYROID LOBECTOMY     RIGHT THYROID    Current Medications: Current Meds  Medication Sig   amantadine (SYMMETREL) 100 MG capsule Take 100 mg by mouth 3 (three) times daily.   Ascorbic Acid (VITAMIN C) 100 MG tablet Take 100 mg by mouth daily.   aspirin EC 81 MG tablet Take 81 mg by mouth daily.    Carbidopa-Levodopa ER (SINEMET CR) 25-100 MG tablet controlled release Take 1 tablet by mouth daily.   dronedarone (MULTAQ) 400 MG tablet Take 1 tablet (400 mg total) by mouth 2 (two) times daily with a meal.   famotidine (PEPCID) 20 MG tablet Take 1 tablet (20 mg total) by mouth 2 (two) times daily.   ibuprofen (ADVIL,MOTRIN) 200 MG tablet Take 3 tablets (600 mg total) by mouth every 8 (eight) hours as needed for headache or mild pain. Resume 5 days post-op as needed   Multiple Vitamin (MULTI-VITAMINS) TABS Take 1 tablet by mouth daily.    ondansetron (ZOFRAN) 4 MG tablet Take 1 tablet (4 mg total) by mouth every 8 (eight) hours as needed for nausea or vomiting.   pregabalin (LYRICA) 200 MG capsule Take 1 capsule (200 mg total) by mouth 2 (two) times daily.   propranolol ER (INDERAL LA) 60 MG 24 hr capsule Take 60 mg by mouth daily.   sildenafil (VIAGRA) 100 MG tablet Take 100 mg by mouth daily as needed for erectile dysfunction.   tamsulosin (FLOMAX) 0.4 MG CAPS capsule Take 1 capsule (0.4 mg total) by mouth daily.     Allergies:   Codeine   Social History   Socioeconomic History   Marital status: Married    Spouse name: Tye [place]   Number of children: 1   Years of education: 12   Highest education level: 12th grade  Occupational History   Occupation: disability  Tobacco Use   Smoking status: Former    Packs/day: 1.50    Years: 40.00    Total pack years: 60.00    Types: Cigarettes    Quit date: 07/30/2010    Years since quitting: 11.3   Smokeless tobacco: Current     Types: Snuff  Vaping Use   Vaping Use: Never used  Substance and Sexual Activity   Alcohol use: No   Drug use: No   Sexual activity: Not on file  Other Topics Concern   Not on file  Social History Narrative   Lives with wife in a one story home.  Has one daughter.  On disability.  Education: 12th   Social Determinants of Health   Financial Resource Strain: Low Risk  (05/26/2021)   Overall Financial Resource Strain (CARDIA)    Difficulty of Paying Living Expenses: Not hard at all  Food Insecurity: No Food Insecurity (05/26/2021)   Hunger Vital Sign    Worried About Running Out of Food in the Last Year: Never  true    Ran Out of Food in the Last Year: Never true  Transportation Needs: No Transportation Needs (05/26/2021)   PRAPARE - Hydrologist (Medical): No    Lack of Transportation (Non-Medical): No  Physical Activity: Inactive (05/26/2021)   Exercise Vital Sign    Days of Exercise per Week: 0 days    Minutes of Exercise per Session: 0 min  Stress: No Stress Concern Present (05/26/2021)   [place]    Feeling of Stress : Not at all  Social Connections: [place] (05/26/2021)   Social Connection and Isolation Panel [NHANES]    Frequency of Communication with Friends and Family: More than three times a week    Frequency of Social Gatherings with Friends and Family: More than three times a week    Attends Religious Services: More than 4 times per year    Active Member of [employer] or Organizations: Yes    Attends Music therapist: More than 4 times per year    Marital Status: Married     Family History: The patient's family history includes Colon polyps in his brother; Diabetes in his mother; Glaucoma in his mother; Hypertension in his brother, daughter, and mother. There is no history of Colon cancer, Rectal cancer, Stomach cancer, or Esophageal cancer.  ROS:    Please see the history of present illness.    All other systems reviewed and are negative.  EKGs/Labs/Other Studies Reviewed:    The following studies were reviewed today: EKG reveals sinus rhythm with nonspecific ST-T changes   Recent Labs: 07/15/2021: ALT 7; BUN 17; Creatinine, Ser 1.09; Potassium 4.1; Sodium 144  Recent Lipid Panel    Component Value Date/Time   CHOL 171 05/10/2021 1031   TRIG 204 (H) 05/10/2021 1031   HDL 29 (L) 05/10/2021 1031   CHOLHDL 5.9 (H) 05/10/2021 1031   CHOLHDL 6.7 08/22/2010 0650   VLDL 49 (H) 08/22/2010 0650   LDLCALC 106 (H) 05/10/2021 1031    Physical Exam:    VS:  BP 138/84   Pulse 62   Ht 6' (1.829 m)   Wt 204 lb (92.5 kg)   SpO2 96%   BMI 27.67 kg/m     Wt Readings from Last 3 Encounters:  12/08/21 204 lb (92.5 kg)  10/13/21 205 lb 3.2 oz (93.1 kg)  07/15/21 208 lb 12.8 oz (94.7 kg)     GEN: Patient is in no acute distress HEENT: Normal NECK: No JVD; No carotid bruits LYMPHATICS: No lymphadenopathy CARDIAC: Hear sounds regular, 2/6 systolic murmur at the apex. RESPIRATORY:  Clear to auscultation without rales, wheezing or rhonchi  ABDOMEN: Soft, non-tender, non-distended MUSCULOSKELETAL:  No edema; No deformity  SKIN: Warm and dry NEUROLOGIC:  Alert and oriented x 3 PSYCHIATRIC:  Normal affect   Signed, Jenean Lindau, MD  12/08/2021 2:30 PM    [place]

## 2021-12-08 NOTE — Patient Instructions (Signed)

## 2021-12-21 DIAGNOSIS — L97912 Non-pressure chronic ulcer of unspecified part of right lower leg with fat layer exposed: Secondary | ICD-10-CM | POA: Diagnosis not present

## 2021-12-21 DIAGNOSIS — I872 Venous insufficiency (chronic) (peripheral): Secondary | ICD-10-CM | POA: Diagnosis not present

## 2021-12-21 DIAGNOSIS — L97812 Non-pressure chronic ulcer of other part of right lower leg with fat layer exposed: Secondary | ICD-10-CM | POA: Diagnosis not present

## 2022-01-11 DIAGNOSIS — L97912 Non-pressure chronic ulcer of unspecified part of right lower leg with fat layer exposed: Secondary | ICD-10-CM | POA: Diagnosis not present

## 2022-01-11 DIAGNOSIS — I872 Venous insufficiency (chronic) (peripheral): Secondary | ICD-10-CM | POA: Diagnosis not present

## 2022-01-11 DIAGNOSIS — G20A1 Parkinson's disease without dyskinesia, without mention of fluctuations: Secondary | ICD-10-CM | POA: Diagnosis not present

## 2022-01-11 DIAGNOSIS — I480 Paroxysmal atrial fibrillation: Secondary | ICD-10-CM | POA: Diagnosis not present

## 2022-05-12 ENCOUNTER — Other Ambulatory Visit: Payer: Self-pay | Admitting: Family Medicine

## 2022-05-12 ENCOUNTER — Encounter: Payer: Medicare Other | Admitting: Family Medicine

## 2022-05-12 DIAGNOSIS — J029 Acute pharyngitis, unspecified: Secondary | ICD-10-CM

## 2022-05-18 DIAGNOSIS — Z9682 Presence of neurostimulator: Secondary | ICD-10-CM | POA: Diagnosis not present

## 2022-05-18 DIAGNOSIS — G20A1 Parkinson's disease without dyskinesia, without mention of fluctuations: Secondary | ICD-10-CM | POA: Diagnosis not present

## 2022-05-18 DIAGNOSIS — G250 Essential tremor: Secondary | ICD-10-CM | POA: Diagnosis not present

## 2022-05-18 DIAGNOSIS — Z79899 Other long term (current) drug therapy: Secondary | ICD-10-CM | POA: Diagnosis not present

## 2022-05-30 ENCOUNTER — Ambulatory Visit (INDEPENDENT_AMBULATORY_CARE_PROVIDER_SITE_OTHER): Payer: Medicare Other

## 2022-05-30 VITALS — Ht 72.0 in | Wt 205.0 lb

## 2022-05-30 DIAGNOSIS — Z122 Encounter for screening for malignant neoplasm of respiratory organs: Secondary | ICD-10-CM

## 2022-05-30 DIAGNOSIS — Z0000 Encounter for general adult medical examination without abnormal findings: Secondary | ICD-10-CM | POA: Diagnosis not present

## 2022-05-30 NOTE — Progress Notes (Signed)
Subjective:   John Norton is a 67 y.o. male who presents for Medicare Annual/Subsequent preventive examination. I connected with  [name] on 05/30/22 by a audio enabled telemedicine application and verified that I am speaking with the correct person using two identifiers.  Patient Location: Home  Provider Location: Home Office  I discussed the limitations of evaluation and management by telemedicine. The patient expressed understanding and agreed to proceed.  Review of Systems     Cardiac Risk Factors include: advanced age (>2men, >81 women);male gender;hypertension     Objective:    Today's Vitals   05/30/22 0926  Weight: 205 lb (93 kg)  Height: 6' (1.829 m)   Body mass index is 27.8 kg/m.     05/30/2022    9:28 AM 05/26/2021    9:05 AM 05/22/2019   10:41 AM 12/03/2018    2:47 PM 11/03/2016   10:45 AM 10/28/2016   11:36 AM 06/21/2016    9:15 AM  Advanced Directives  Does Patient Have a Medical Advance Directive? No No No No No No No  Would patient like information on creating a medical advance directive? No - Patient declined No - Patient declined No - Patient declined  No - Patient declined No - Patient declined     Current Medications (verified) Outpatient Encounter Medications as of 05/30/2022  Medication Sig   amantadine (SYMMETREL) 100 MG capsule Take 100 mg by mouth 3 (three) times daily.   Ascorbic Acid (VITAMIN C) 100 MG tablet Take 100 mg by mouth daily.   aspirin EC 81 MG tablet Take 81 mg by mouth daily.    Carbidopa-Levodopa ER (SINEMET CR) 25-100 MG tablet controlled release Take 1 tablet by mouth daily.   dronedarone (MULTAQ) 400 MG tablet Take 1 tablet (400 mg total) by mouth 2 (two) times daily with a meal.   famotidine (PEPCID) 20 MG tablet Take 1 tablet (20 mg total) by mouth 2 (two) times daily. (NEEDS TO BE SEEN BEFORE NEXT REFILL)   ibuprofen (ADVIL,MOTRIN) 200 MG tablet Take 3 tablets (600 mg total) by mouth every 8 (eight) hours as needed  for headache or mild pain. Resume 5 days post-op as needed   Multiple Vitamin (MULTI-VITAMINS) TABS Take 1 tablet by mouth daily.    ondansetron (ZOFRAN) 4 MG tablet Take 1 tablet (4 mg total) by mouth every 8 (eight) hours as needed for nausea or vomiting.   pregabalin (LYRICA) 200 MG capsule Take 1 capsule (200 mg total) by mouth 2 (two) times daily.   propranolol ER (INDERAL LA) 60 MG 24 hr capsule Take 60 mg by mouth daily.   sildenafil (VIAGRA) 100 MG tablet Take 100 mg by mouth daily as needed for erectile dysfunction.   tamsulosin (FLOMAX) 0.4 MG CAPS capsule Take 1 capsule (0.4 mg total) by mouth daily.   No facility-administered encounter medications on file as of 05/30/2022.    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 09/15/2021   Parkinson's disease 12/30/2015   Paroxysmal atrial fibrillation 10/20/2016   S/P deep brain stimulator placement 05/27/2020   Scoliosis deformity of spine 10/11/2017   Sleep apnea    does not use cpap, NO SLEEP APNEA SINCE QUIT SMOKING 2012   Spinal stenosis    Spinal stenosis at L4-L5 level 11/03/2016   Spinal stenosis of lumbar region 11/03/2016   Tremors of nervous system    Venous stasis dermatitis of both lower extremities 09/21/2021   Past Surgical History:  Procedure Laterality Date   "spot on lung"  SEVERAL YRS AGO   SPOT WENY AWAY   CHEST X-RAY  08/21/10   No cardiopulmonary process noted.    CHOLECYSTECTOMY N/A 04/20/2012   Procedure: LAPAROSCOPIC CHOLECYSTECTOMY;  Surgeon: Jamesetta So, MD;  Location: [facility];  Service: General;  Laterality: N/A;   COLONOSCOPY  2013   [place]  08/22/10    There is no stress-induced ischemia,  but a fixed deficit in the lateral apex and inferior wall.  Ejection fraction 51%   LUMBAR LAMINECTOMY/DECOMPRESSION MICRODISCECTOMY N/A 11/03/2016   Procedure: Microlumbar Decompression L4-5, L5-S1;  Surgeon: Susa Day, MD;  Location: [facility];  Service: Orthopedics;  Laterality: N/A;  120 mins   POLYPECTOMY  2013   TA's   THYROID LOBECTOMY     RIGHT THYROID   Family History  Problem Relation Age of Onset   Colon polyps Brother    Hypertension Brother    Diabetes Mother    Hypertension Mother    Glaucoma Mother    Hypertension Daughter    Colon cancer Neg Hx    Rectal cancer Neg Hx    Stomach cancer Neg Hx    Esophageal cancer Neg Hx    Social History   Socioeconomic History   Marital status: Married    Spouse name: Tye [place]   Number of children: 1   Years of education: 12   Highest education level: 12th grade  Occupational History   Occupation: disability  Tobacco Use   Smoking status: Former    Packs/day: 1.50    Years: 40.00    Additional pack years: 0.00    Total pack years: 60.00    Types: Cigarettes    Quit date: 07/30/2010    Years since quitting: 11.8   Smokeless tobacco: Current    Types: Snuff  Vaping Use   Vaping Use: Never used  Substance and Sexual Activity   Alcohol use: No   Drug use: No   Sexual activity: Not on file  Other Topics Concern   Not on file  Social History Narrative   Lives with wife in a one story home.  Has one daughter.  On disability.  Education: 12th   Social Determinants of Health   Financial Resource Strain: Low Risk  (05/30/2022)   Overall Financial Resource Strain (CARDIA)    Difficulty of Paying Living Expenses: Not hard at all  Food Insecurity: No  Food Insecurity (05/30/2022)   Hunger Vital Sign    Worried About Running Out of Food in the Last Year: Never true    Ran Out of Food in the Last Year: Never true  Transportation Needs: No Transportation Needs (05/30/2022)   PRAPARE - Hydrologist (Medical): No    Lack of Transportation (Non-Medical): No  Physical Activity: Insufficiently Active (05/30/2022)   Exercise Vital Sign    Days of Exercise per Week:  3 days    Minutes of Exercise per Session: 30 min  Stress: No Stress Concern Present (05/30/2022)   [place]    Feeling of Stress : Not at all  Social Connections: Moderately Integrated (05/30/2022)   Social Connection and Isolation Panel [NHANES]    Frequency of Communication with Friends and Family: More than three times a week    Frequency of Social Gatherings with Friends and Family: More than three times a week    Attends Religious Services: More than 4 times per year    Active Member of [employer] or Organizations: No    Attends Music therapist: Never    Marital Status: Married    Tobacco Counseling Ready to quit: Not Answered Counseling given: Not Answered   Clinical Intake:  Pre-visit preparation completed: Yes  Pain : No/denies pain     Nutritional Risks: None Diabetes: No  How often do you need to have someone help you when you read instructions, pamphlets, or other written materials from your doctor or pharmacy?: 1 - Never  Diabetic?no   Interpreter Needed?: No  Information entered by :: Jadene Pierini, LPN   Activities of Daily Living    05/30/2022    9:28 AM  In your present state of health, do you have any difficulty performing the following activities:  Hearing? 0  Vision? 0  Difficulty concentrating or making decisions? 0  Walking or climbing stairs? 0  Dressing or bathing? 0  Doing errands, shopping? 0  Preparing Food and eating ? N  Using the  Toilet? N  In the past six months, have you accidently leaked urine? N  Do you have problems with loss of bowel control? N  Managing your Medications? N  Managing your Finances? N  Housekeeping or managing your Housekeeping? N    Patient Care Team: Dettinger, Fransisca Kaufmann, MD as PCP - General (Family Medicine)  Indicate any recent Medical Services you may have received from other than [facility] providers in the past year (date may be approximate).     Assessment:   This is a routine wellness examination for Girard.  Hearing/Vision screen Vision Screening - Comments:: Wears rx glasses - up to date with routine eye exams with  Dr.Johnson   Dietary issues and exercise activities discussed: Current Exercise Habits: Home exercise routine, Type of exercise: walking, Time (Minutes): 30, Frequency (Times/Week): 3, Weekly Exercise (Minutes/Week): 90, Intensity: Mild, Exercise limited by: None identified   Goals Addressed             This Visit's Progress    DIET - INCREASE WATER INTAKE         Depression Screen    05/30/2022    9:28 AM 10/13/2021    9:45 AM 10/13/2021    9:27 AM 05/26/2021    9:01 AM 05/10/2021   10:04 AM 05/07/2020    9:54 AM 05/22/2019   10:41 AM  PHQ 2/9 Scores  PHQ - 2 Score 0 5 0 0 0 0 0  PHQ- 9 Score  10  0 3      Fall Risk    05/30/2022    9:26 AM 10/13/2021    9:27 AM 05/26/2021    9:06 AM 05/10/2021   10:04 AM 05/07/2020    9:54 AM  Fall Risk   Falls in the past year? 0 0 1 0 0  Number falls in past yr: 0  0    Injury with  Fall? 0  0    Risk for fall due to : No Fall Risks  Impaired balance/gait;Impaired mobility    Follow up Falls prevention discussed  Falls prevention discussed      FALL RISK PREVENTION PERTAINING TO THE HOME:  Any stairs in or around the home? Yes  If so, are there any without handrails? No  Home free of loose throw rugs in walkways, pet beds, electrical cords, etc? Yes  Adequate lighting in your home to reduce risk of falls? Yes    ASSISTIVE DEVICES UTILIZED TO PREVENT FALLS:  Life alert? No  Use of a cane, walker or w/c? Yes  Grab bars in the bathroom? No  Shower chair or bench in shower? No  Elevated toilet seat or a handicapped toilet? No        05/30/2022    9:29 AM 05/26/2021    9:11 AM 05/22/2019   10:45 AM  6CIT Screen  What Year? 0 points 0 points 0 points  What month? 0 points 0 points 0 points  What time? 0 points 0 points 0 points  Count back from 20 0 points 0 points 0 points  Months in reverse 0 points 0 points 0 points  Repeat phrase 0 points 0 points 0 points  Total Score 0 points 0 points 0 points    Immunizations Immunization History  Administered Date(s) Administered   Influenza,inj,Quad PF,6+ Mos 01/14/2019   Influenza-Unspecified 01/14/2013, 01/19/2015   Moderna Sars-Covid-2 Vaccination 08/01/2019, 08/29/2019, 03/06/2020   Pneumococcal Conjugate-13 01/14/2019   Tdap 05/07/2020   Zoster Recombinat (Shingrix) 05/02/2019, 07/05/2019    TDAP status: Up to date  Flu Vaccine status: Up to date  Pneumococcal vaccine status: Up to date  Covid-19 vaccine status: Completed vaccines  Qualifies for Shingles Vaccine? Yes   Zostavax completed Yes   Shingrix Completed?: Yes  Screening Tests Health Maintenance  Topic Date Due   Lung Cancer Screening  01/15/2014   Pneumonia Vaccine 69+ Years old (2 of 2 - PPSV23 or PCV20) 05/27/2021   COVID-19 Vaccine (4 - 2023-24 season) 10/29/2021   INFLUENZA VACCINE  09/29/2022   Medicare Annual Wellness (AWV)  05/30/2023   COLONOSCOPY (Pts 45-58yrs Insurance coverage will need to be confirmed)  07/25/2026   DTaP/Tdap/Td (2 - Td or Tdap) 05/08/2030   Hepatitis C Screening  Completed   Zoster Vaccines- Shingrix  Completed   HPV VACCINES  Aged Out    Health Maintenance  Health Maintenance Due  Topic Date Due   Lung Cancer Screening  01/15/2014   Pneumonia Vaccine 23+ Years old (2 of 2 - PPSV23 or PCV20) 05/27/2021   COVID-19 Vaccine (4 -  2023-24 season) 10/29/2021    Colorectal cancer screening: Type of screening: Colonoscopy. Completed 05/027/2021. Repeat every 7 years  Lung Cancer Screening: (Low Dose CT Chest recommended if Age 62-80 years, 30 pack-year currently smoking OR have quit w/in 15years.) does qualify.   Lung Cancer Screening Referral: 05/30/2022  Additional Screening:  Hepatitis C Screening: does not qualify;   Vision Screening: Recommended annual ophthalmology exams for early detection of glaucoma and other disorders of the eye. Is the patient up to date with their annual eye exam?  Yes  Who is the provider or what is the name of the office in which the patient attends annual eye exams? Dr.Johnson  If pt is not established with a provider, would they like to be referred to a provider to establish care? No .   Dental Screening: Recommended annual  dental exams for proper oral hygiene  Community Resource Referral / Chronic Care Management: CRR required this visit?  No   CCM required this visit?  No      Plan:     I have personally reviewed and noted the following in the patient's chart:   Medical and social history Use of alcohol, tobacco or illicit drugs  Current medications and supplements including opioid prescriptions. Patient is not currently taking opioid prescriptions. Functional ability and status Nutritional status Physical activity Advanced directives List of other physicians Hospitalizations, surgeries, and ER visits in previous 12 months Vitals Screenings to include cognitive, depression, and falls Referrals and appointments  In addition, I have reviewed and discussed with patient certain preventive protocols, quality metrics, and best practice recommendations. A written personalized care plan for preventive services as well as general preventive health recommendations were provided to patient.     Daphane Shepherd, LPN   [id number]   Nurse Notes: none

## 2022-05-30 NOTE — Patient Instructions (Signed)
John Norton , Thank you for taking time to come for your Medicare Wellness Visit. I appreciate your ongoing commitment to your health goals. Please review the following plan we discussed and let me know if I can assist you in the future.   These are the goals we discussed:  Goals      DIET - INCREASE WATER INTAKE     Patient Stated     05/26/2021-Encouraged pt to try chair exercises.   05/22/2019 AWV Goal: Exercise for General Health  Patient will verbalize understanding of the benefits of increased physical activity: Exercising regularly is important. It will improve your overall fitness, flexibility, and endurance. Regular exercise also will improve your overall health. It can help you control your weight, reduce stress, and improve your bone density. Over the next year, patient will increase physical activity as tolerated with a goal of at least 150 minutes of moderate physical activity per week.  You can tell that you are exercising at a moderate intensity if your heart starts beating faster and you start breathing faster but can still hold a conversation. Moderate-intensity exercise ideas include: Walking 1 mile (1.6 km) in about 15 minutes Biking Hiking Golfing Dancing Water aerobics Patient will verbalize understanding of everyday activities that increase physical activity by providing examples like the following: Yard work, such as: Sales promotion account executive Gardening Washing windows or floors Patient will be able to explain general safety guidelines for exercising:  Before you start a new exercise program, talk with your health care provider. Do not exercise so much that you hurt yourself, feel dizzy, or get very short of breath. Wear comfortable clothes and wear shoes with good support. Drink plenty of water while you exercise to prevent dehydration or heat stroke. Work out until your breathing and  your heartbeat get faster.         This is a list of the screening recommended for you and due dates:  Health Maintenance  Topic Date Due   Screening for Lung Cancer  01/15/2014   Pneumonia Vaccine (2 of 2 - PPSV23 or PCV20) 05/27/2021   COVID-19 Vaccine (4 - 2023-24 season) 10/29/2021   Flu Shot  09/29/2022   Medicare Annual Wellness Visit  05/30/2023   Colon Cancer Screening  07/25/2026   DTaP/Tdap/Td vaccine (2 - Td or Tdap) 05/08/2030   Hepatitis C Screening: USPSTF Recommendation to screen - Ages 14-79 yo.  Completed   Zoster (Shingles) Vaccine  Completed   HPV Vaccine  Aged Out    Advanced directives: Advance directive discussed with you today. I have provided a copy for you to complete at home and have notarized. Once this is complete please bring a copy in to our office so we can scan it into your chart.   Conditions/risks identified: Aim for 30 minutes of exercise or brisk walking, 6-8 glasses of water, and 5 servings of fruits and vegetables each day.   Next appointment: Follow up in one year for your annual wellness visit.   Preventive Care 37 Years and Older, Male  Preventive care refers to lifestyle choices and visits with your health care provider that can promote health and wellness. What does preventive care include? A yearly physical exam. This is also called an annual well check. Dental exams once or twice a year. Routine eye exams. Ask your health care provider how often you should have your eyes checked. Personal lifestyle choices, including:  Daily care of your teeth and gums. Regular physical activity. Eating a healthy diet. Avoiding tobacco and drug use. Limiting alcohol use. Practicing safe sex. Taking low doses of aspirin every day. Taking vitamin and mineral supplements as recommended by your health care provider. What happens during an annual well check? The services and screenings done by your health care provider during your annual well check  will depend on your age, overall health, lifestyle risk factors, and family history of disease. Counseling  Your health care provider may ask you questions about your: Alcohol use. Tobacco use. Drug use. Emotional well-being. Home and relationship well-being. Sexual activity. Eating habits. History of falls. Memory and ability to understand (cognition). Work and work Statistician. Screening  You may have the following tests or measurements: Height, weight, and BMI. Blood pressure. Lipid and cholesterol levels. These may be checked every 5 years, or more frequently if you are over 20 years old. Skin check. Lung cancer screening. You may have this screening every year starting at age 89 if you have a 30-pack-year history of smoking and currently smoke or have quit within the past 15 years. Fecal occult blood test (FOBT) of the stool. You may have this test every year starting at age 62. Flexible sigmoidoscopy or colonoscopy. You may have a sigmoidoscopy every 5 years or a colonoscopy every 10 years starting at age 19. Prostate cancer screening. Recommendations will vary depending on your family history and other risks. Hepatitis C blood test. Hepatitis B blood test. Sexually transmitted disease (STD) testing. Diabetes screening. This is done by checking your blood sugar (glucose) after you have not eaten for a while (fasting). You may have this done every 1-3 years. Abdominal aortic aneurysm (AAA) screening. You may need this if you are a current or former smoker. Osteoporosis. You may be screened starting at age 35 if you are at high risk. Talk with your health care provider about your test results, treatment options, and if necessary, the need for more tests. Vaccines  Your health care provider may recommend certain vaccines, such as: Influenza vaccine. This is recommended every year. Tetanus, diphtheria, and acellular pertussis (Tdap, Td) vaccine. You may need a Td booster every 10  years. Zoster vaccine. You may need this after age 49. Pneumococcal 13-valent conjugate (PCV13) vaccine. One dose is recommended after age 51. Pneumococcal polysaccharide (PPSV23) vaccine. One dose is recommended after age 14. Talk to your health care provider about which screenings and vaccines you need and how often you need them. This information is not intended to replace advice given to you by your health care provider. Make sure you discuss any questions you have with your health care provider. Document Released: 03/13/2015 Document Revised: 11/04/2015 Document Reviewed: 12/16/2014 Elsevier Interactive Patient Education  2017 [employer] Prevention in the Home Falls can cause injuries. They can happen to people of all ages. There are many things you can do to make your home safe and to help prevent falls. What can I do on the outside of my home? Regularly fix the edges of walkways and driveways and fix any cracks. Remove anything that might make you trip as you walk through a door, such as a raised step or threshold. Trim any bushes or trees on the path to your home. Use bright outdoor lighting. Clear any walking paths of anything that might make someone trip, such as rocks or tools. Regularly check to see if handrails are loose or broken. Make sure that both sides  of any steps have handrails. Any raised decks and porches should have guardrails on the edges. Have any leaves, snow, or ice cleared regularly. Use sand or salt on walking paths during winter. Clean up any spills in your garage right away. This includes oil or grease spills. What can I do in the bathroom? Use night lights. Install grab bars by the toilet and in the tub and shower. Do not use towel bars as grab bars. Use non-skid mats or decals in the tub or shower. If you need to sit down in the shower, use a plastic, non-slip stool. Keep the floor dry. Clean up any water that spills on the floor as soon as it  happens. Remove soap buildup in the tub or shower regularly. Attach bath mats securely with double-sided non-slip rug tape. Do not have throw rugs and other things on the floor that can make you trip. What can I do in the bedroom? Use night lights. Make sure that you have a light by your bed that is easy to reach. Do not use any sheets or blankets that are too big for your bed. They should not hang down onto the floor. Have a firm chair that has side arms. You can use this for support while you get dressed. Do not have throw rugs and other things on the floor that can make you trip. What can I do in the kitchen? Clean up any spills right away. Avoid walking on wet floors. Keep items that you use a lot in easy-to-reach places. If you need to reach something above you, use a strong step stool that has a grab bar. Keep electrical cords out of the way. Do not use floor polish or wax that makes floors slippery. If you must use wax, use non-skid floor wax. Do not have throw rugs and other things on the floor that can make you trip. What can I do with my stairs? Do not leave any items on the stairs. Make sure that there are handrails on both sides of the stairs and use them. Fix handrails that are broken or loose. Make sure that handrails are as long as the stairways. Check any carpeting to make sure that it is firmly attached to the stairs. Fix any carpet that is loose or worn. Avoid having throw rugs at the top or bottom of the stairs. If you do have throw rugs, attach them to the floor with carpet tape. Make sure that you have a light switch at the top of the stairs and the bottom of the stairs. If you do not have them, ask someone to add them for you. What else can I do to help prevent falls? Wear shoes that: Do not have high heels. Have rubber bottoms. Are comfortable and fit you well. Are closed at the toe. Do not wear sandals. If you use a stepladder: Make sure that it is fully opened.  Do not climb a closed stepladder. Make sure that both sides of the stepladder are locked into place. Ask someone to hold it for you, if possible. Clearly mark and make sure that you can see: Any grab bars or handrails. First and last steps. Where the edge of each step is. Use tools that help you move around (mobility aids) if they are needed. These include: Canes. Walkers. Scooters. Crutches. Turn on the lights when you go into a dark area. Replace any light bulbs as soon as they burn out. Set up your furniture so you have  a clear path. Avoid moving your furniture around. If any of your floors are uneven, fix them. If there are any pets around you, be aware of where they are. Review your medicines with your doctor. Some medicines can make you feel dizzy. This can increase your chance of falling. Ask your doctor what other things that you can do to help prevent falls. This information is not intended to replace advice given to you by your health care provider. Make sure you discuss any questions you have with your health care provider. Document Released: 12/11/2008 Document Revised: 07/23/2015 Document Reviewed: 03/21/2014 Elsevier Interactive Patient Education  2017 [employer].

## 2022-06-05 ENCOUNTER — Other Ambulatory Visit: Payer: Self-pay | Admitting: Family Medicine

## 2022-06-05 DIAGNOSIS — J029 Acute pharyngitis, unspecified: Secondary | ICD-10-CM

## 2022-06-06 DIAGNOSIS — K117 Disturbances of salivary secretion: Secondary | ICD-10-CM | POA: Diagnosis not present

## 2022-06-06 DIAGNOSIS — R1319 Other dysphagia: Secondary | ICD-10-CM | POA: Diagnosis not present

## 2022-06-06 DIAGNOSIS — J3489 Other specified disorders of nose and nasal sinuses: Secondary | ICD-10-CM | POA: Diagnosis not present

## 2022-06-09 ENCOUNTER — Other Ambulatory Visit: Payer: Self-pay | Admitting: Family Medicine

## 2022-06-09 DIAGNOSIS — J029 Acute pharyngitis, unspecified: Secondary | ICD-10-CM

## 2022-06-09 NOTE — Telephone Encounter (Signed)
Dettinger NTBS 30-d given 05/12/22

## 2022-06-24 ENCOUNTER — Other Ambulatory Visit: Payer: Self-pay | Admitting: Family Medicine

## 2022-06-24 DIAGNOSIS — J029 Acute pharyngitis, unspecified: Secondary | ICD-10-CM

## 2022-06-24 DIAGNOSIS — N400 Enlarged prostate without lower urinary tract symptoms: Secondary | ICD-10-CM

## 2022-06-24 NOTE — Telephone Encounter (Signed)
Patient last seen 03/23. Patient must schedule follow up appointment for refills.

## 2022-06-27 NOTE — Telephone Encounter (Signed)
LMTCB with wife for pt to schedule appt

## 2022-07-18 DIAGNOSIS — R1319 Other dysphagia: Secondary | ICD-10-CM | POA: Insufficient documentation

## 2022-07-18 DIAGNOSIS — R1312 Dysphagia, oropharyngeal phase: Secondary | ICD-10-CM | POA: Insufficient documentation

## 2022-07-18 DIAGNOSIS — R633 Feeding difficulties, unspecified: Secondary | ICD-10-CM | POA: Diagnosis not present

## 2022-08-20 ENCOUNTER — Other Ambulatory Visit: Payer: Self-pay | Admitting: Family Medicine

## 2022-08-20 DIAGNOSIS — J029 Acute pharyngitis, unspecified: Secondary | ICD-10-CM

## 2022-08-22 ENCOUNTER — Encounter: Payer: Self-pay | Admitting: Family Medicine

## 2022-08-22 NOTE — Telephone Encounter (Signed)
LMTCB to schedule appt Letter mailed 

## 2022-08-22 NOTE — Telephone Encounter (Signed)
Dettinger pt NTBS 30-d given 05/12/22

## 2022-12-15 ENCOUNTER — Telehealth: Payer: Self-pay | Admitting: Cardiology

## 2022-12-15 ENCOUNTER — Other Ambulatory Visit: Payer: Self-pay

## 2022-12-15 NOTE — Telephone Encounter (Signed)
Patient c/o Palpitations:  STAT if patient reporting lightheadedness, shortness of breath, or chest pain  How long have you had palpitations/irregular HR/ Afib? Are you having the symptoms now?   Yes, fluttering  Are you currently experiencing lightheadedness, SOB or CP?   No  Do you have a history of afib (atrial fibrillation) or irregular heart rhythm?   Yes  Have you checked your BP or HR? (document readings if available):   143/79  HR 56-59  Are you experiencing any other symptoms?  No  Wife Olegario Messier) stated patient has been having these symptoms on and off for the last couple of weeks.  Wife stated patient's BP has been normal and his HR has been 56-59.

## 2022-12-15 NOTE — Telephone Encounter (Signed)
Called patient's wife and she reported that the patient was feeling like his heart was fluttering in his chest. Patient does have history of A-fib. These episodes have lasted up to several hours at times. When they start they are very intense and then they ease off. They have been occurring more frequently over the past few weeks. Patient blood pressure this morning was 143/79 HR 56. Patient states that during the "fluttering" episodes his heart rate stays in the 50 - 60 bpm range. Please advise.

## 2022-12-19 ENCOUNTER — Telehealth: Payer: Self-pay | Admitting: Cardiology

## 2022-12-19 NOTE — Telephone Encounter (Signed)
Patient would like to to switch to [facility] office, because it is closer. Please advise

## 2022-12-19 NOTE — Telephone Encounter (Signed)
Left message for the patient to call back.  

## 2022-12-20 NOTE — Telephone Encounter (Signed)
Left message for the patient to call back.  

## 2022-12-21 NOTE — Telephone Encounter (Signed)
Left message for the patient to call back.  

## 2022-12-22 NOTE — Telephone Encounter (Signed)
Left message for the patient to call back.  

## 2022-12-22 NOTE — Telephone Encounter (Signed)
Called patient's cell phone and his wife answered. I explained how we had been trying to reach the patient because Dr. Bing Matter had wanted him to wear a heart monitor for 2 weeks. She stated that they were in the process of switching to the [place] location with Dr. Antoine Poche because it was closer to their home. She also stated that they would take up the recommendation to wear a heart monitor with Dr. Antoine Poche when they had an appointment with him. Patient's wife had no further questions at this time.

## 2022-12-27 ENCOUNTER — Telehealth: Payer: Self-pay | Admitting: Cardiology

## 2022-12-27 DIAGNOSIS — Z9689 Presence of other specified functional implants: Secondary | ICD-10-CM | POA: Diagnosis not present

## 2022-12-27 DIAGNOSIS — G20A1 Parkinson's disease without dyskinesia, without mention of fluctuations: Secondary | ICD-10-CM | POA: Diagnosis not present

## 2022-12-27 DIAGNOSIS — G20A2 Parkinson's disease without dyskinesia, with fluctuations: Secondary | ICD-10-CM | POA: Diagnosis not present

## 2022-12-27 NOTE — Telephone Encounter (Signed)
Patient c/o Palpitations:  STAT if patient reporting lightheadedness, shortness of breath, or chest pain  How long have you had palpitations/irregular HR/ Afib? Are you having the symptoms now?   No  Are you currently experiencing lightheadedness, SOB or CP?   No  Do you have a history of afib (atrial fibrillation) or irregular heart rhythm?   Yes  Have you checked your BP or HR? (document readings if available):   Wife stated BP and HR has been fairly normal  Are you experiencing any other symptoms?  Wife noted patient has Parkinson's Disease  Wife is concerned patient has been having more palpitations recently.

## 2022-12-27 NOTE — Telephone Encounter (Signed)
Pt reports increased palpitations over the last week when he is resting. Not having palpitations while on the phone. No SOB, CP, dizziness reported by pt. Pt did not have recent vitals to provided during call.   Chart review shows patient taking propanolol but not anticoagulated. Appointment scheduled with APP for tomorrow. ER precautions reviewed with pt. Pt verbalized understanding. No further questions at this time.

## 2022-12-27 NOTE — Progress Notes (Addendum)
Cardiology Office Note    Date:  12/28/2022  ID:  John Norton, DOB 17-Feb-1956, MRN [id number] PCP:  Dettinger, Elige Radon, MD  Cardiologist:  None  Electrophysiologist:  None   Chief Complaint: Follow for palpitations   History of Present Illness: .    John Norton is a 67 y.o. male with visit-pertinent history of paroxysmal atrial fibrillation not on anticoagulation, LBBB, Parkinson's Disease,   Initially seen by Dr. Tomie Norton in 2018 for preoperative cardiac clearance.  Myoview Lexiscan in 02/2018 indicated stress EF 31%, no evidence of ischemia it was considered however study due to reduced systolic function, it was noted that there was a small fixed defect of moderate severity present in the mid anteroseptal and apex location.  Follow-up echocardiogram on 02/2018 indicated LVEF of 40 to 45%, wall motion was normal, no RWMA, grade 1 diastolic dysfunction.  Most recent echocardiogram on 06/2021 was limited due to tachycardia, LVEF was estimated at 40 to 45%, LV with mildly decreased function, LV with regional wall motion abnormalities, moderate hypokinesis of the LV, mid apical anterior wall, mild LVH.  Patient also has history of PAF since 2019, not previously started on anticoagulation.  In 06/2021 he was started on Multaq which he self discontinued after a month in setting of GI upset.    Today he presents regarding palpitations.  He reports that he has noticed a fluttering sensation in his left lower chest that is intermittent.  He reports that he feels it more when he is sitting in his recliner, episodes can last anywhere from hours to a few minutes.  He denies any associated symptoms.  He reports that he checks his heart rate with his blood pressure cuff and his blood pressure is always in the 50s into 60s.  Patient questions if fluttering sensation is related to the implanted battery pack for his deep brain stimulator which is in the left chest.  ROS: .    Today he denies chest pain,  shortness of breath, lower extremity edema, fatigue, melena, hematuria, hemoptysis, diaphoresis, weakness, presyncope, syncope, orthopnea, and PND.  All other systems are reviewed and otherwise negative.  Studies Reviewed: Marland Kitchen    EKG:  EKG is ordered today, personally reviewed, demonstrating  EKG Interpretation Date/Time:  Wednesday December 28 2022 14:48:56 EDT Ventricular Rate:  52 PR Interval:  142 QRS Duration:  150 QT Interval:  490 QTC Calculation: 455 R Axis:   -7  Text Interpretation: Sinus bradycardia Left bundle branch block No significant change since last tracing Confirmed by Reather Littler [id number]) on 12/28/2022 2:56:24 PM   CV Studies:  Cardiac Studies & Procedures     STRESS TESTS  MYOCARDIAL PERFUSION IMAGING 03/23/2018  Interpretation Summary  Nuclear stress EF: 31%.  There was no ST segment deviation noted during stress.  Defect 1: There is a small, fixed defect of moderate severity present in the mid anteroseptal and apex location.  This is a high risk study due to reduced systolic function.  The left ventricular ejection fraction is moderately decreased (30-44%).  There is no ischemia.   ECHOCARDIOGRAM  ECHOCARDIOGRAM COMPLETE 07/21/2021  Narrative ECHOCARDIOGRAM REPORT    Patient Name:   John Norton Date of Exam: 07/21/2021 Medical Rec #:  [id number]       Height:       72.0 in Accession #:    [id number]      Weight:       208.8 lb Date of Birth:  Apr 12, 1955  BSA:          2.170 m Patient Age:    66 years        BP:           150/78 mmHg Patient Gender: M               HR:           80 bpm. Exam Location:  Inpatient  Procedure: 2D Echo, Cardiac Doppler, Color Doppler and Intracardiac Opacification Agent  Indications:    Atrial fibrillation  History:        Patient has prior history of Echocardiogram examinations, most recent 03/28/2018. Arrythmias:Atrial Fibrillation.  Sonographer:    Roosvelt Maser RDCS Referring Phys: Rito Ehrlich  [facility]  IMPRESSIONS   1. TDS, tzchycardia. Consider repeating the study when pt is more stable. Left ventricular ejection fraction, by estimation, is 40 to 45%. The left ventricle has mildly decreased function. The left ventricle demonstrates regional wall motion abnormalities (see scoring diagram/findings for description). There is mild left ventricular hypertrophy. Left ventricular diastolic parameters are indeterminate. There is moderate hypokinesis of the left ventricular, mid-apical anterior wall. 2. Right ventricular systolic function is normal. The right ventricular size is normal. 3. The mitral valve is normal in structure. No evidence of mitral valve regurgitation. No evidence of mitral stenosis. 4. The aortic valve is normal in structure. Aortic valve regurgitation is not visualized. No aortic stenosis is present. 5. The inferior vena cava is normal in size with greater than 50% respiratory variability, suggesting right atrial pressure of 3 mmHg.  FINDINGS Left Ventricle: TDS, tzchycardia. Consider repeating the study when pt is more stable. Left ventricular ejection fraction, by estimation, is 40 to 45%. The left ventricle has mildly decreased function. The left ventricle demonstrates regional wall motion abnormalities. Moderate hypokinesis of the left ventricular, mid-apical anterior wall. Definity contrast agent was given IV to delineate the left ventricular endocardial borders. The left ventricular internal cavity size was normal in size. There is mild left ventricular hypertrophy. Left ventricular diastolic parameters are indeterminate.  Right Ventricle: The right ventricular size is normal. No increase in right ventricular wall thickness. Right ventricular systolic function is normal.  Left Atrium: Left atrial size was normal in size.  Right Atrium: Right atrial size was normal in size.  Pericardium: There is no evidence of pericardial effusion.  Mitral Valve: The mitral  valve is normal in structure. No evidence of mitral valve regurgitation. No evidence of mitral valve stenosis.  Tricuspid Valve: The tricuspid valve is normal in structure. Tricuspid valve regurgitation is mild . No evidence of tricuspid stenosis.  Aortic Valve: The aortic valve is normal in structure. Aortic valve regurgitation is not visualized. No aortic stenosis is present. Aortic valve mean gradient measures 5.0 mmHg. Aortic valve peak gradient measures 9.7 mmHg. Aortic valve area, by VTI measures 1.38 cm.  Pulmonic Valve: The pulmonic valve was normal in structure. Pulmonic valve regurgitation is not visualized. No evidence of pulmonic stenosis.  Aorta: The aortic root is normal in size and structure.  Venous: The inferior vena cava is normal in size with greater than 50% respiratory variability, suggesting right atrial pressure of 3 mmHg.  IAS/Shunts: No atrial level shunt detected by color flow Doppler.   LEFT VENTRICLE PLAX 2D LVIDd:         5.10 cm LVIDs:         3.90 cm LV PW:         1.20 cm LV IVS:  1.30 cm LVOT diam:     2.00 cm      3D Volume EF: LV SV:         40           3D EF:        45 % LV SV Index:   18           LV EDV:       202 ml LVOT Area:     3.14 cm     LV ESV:       112 ml LV SV:        90 ml  LV Volumes (MOD) LV vol d, MOD A2C: 120.5 ml LV vol d, MOD A4C: 118.2 ml LV vol s, MOD A2C: 71.0 ml LV vol s, MOD A4C: 61.2 ml LV SV MOD A2C:     49.6 ml LV SV MOD A4C:     118.2 ml LV SV MOD BP:      54.0 ml  RIGHT VENTRICLE             IVC RV Basal diam:  3.90 cm     IVC diam: 2.30 cm RV S prime:     10.20 cm/s TAPSE (M-mode): 1.3 cm  LEFT ATRIUM             Index        RIGHT ATRIUM           Index LA diam:        4.40 cm 2.03 cm/m   RA Area:     14.00 cm LA Vol (A2C):   81.0 ml 37.33 ml/m  RA Volume:   31.30 ml  14.42 ml/m LA Vol (A4C):   53.7 ml 24.75 ml/m LA Biplane Vol: 67.6 ml 31.15 ml/m AORTIC VALVE AV Area (Vmax):    1.58  cm AV Area (Vmean):   1.49 cm AV Area (VTI):     1.38 cm AV Vmax:           156.00 cm/s AV Vmean:          101.000 cm/s AV VTI:            0.286 m AV Peak Grad:      9.7 mmHg AV Mean Grad:      5.0 mmHg LVOT Vmax:         78.30 cm/s LVOT Vmean:        47.800 cm/s LVOT VTI:          0.126 m LVOT/AV VTI ratio: 0.44  AORTA Ao Root diam: 2.90 cm Ao Asc diam:  3.40 cm   SHUNTS Systemic VTI:  0.13 m Systemic Diam: 2.00 cm  Gypsy Balsam MD Electronically signed by Gypsy Balsam MD Signature Date/Time: 07/21/2021/5:01:38 PM    Final              Current Reported Medications:.    Current Meds  Medication Sig   amantadine (SYMMETREL) 100 MG capsule Take 100 mg by mouth 3 (three) times daily.   apixaban (ELIQUIS) 5 MG TABS tablet Take 1 tablet (5 mg total) by mouth 2 (two) times daily.   Ascorbic Acid (VITAMIN C) 100 MG tablet Take 100 mg by mouth daily.   Carbidopa-Levodopa ER (SINEMET CR) 25-100 MG tablet controlled release Take 1 tablet by mouth daily.   famotidine (PEPCID) 20 MG tablet Take 1 tablet (20 mg total) by mouth 2 (two) times daily. (NEEDS TO BE SEEN BEFORE NEXT REFILL)  Multiple Vitamin (MULTI-VITAMINS) TABS Take 1 tablet by mouth daily.    ondansetron (ZOFRAN) 4 MG tablet Take 1 tablet (4 mg total) by mouth every 8 (eight) hours as needed for nausea or vomiting.   pregabalin (LYRICA) 200 MG capsule Take 1 capsule (200 mg total) by mouth 2 (two) times daily.   propranolol ER (INDERAL LA) 60 MG 24 hr capsule Take 60 mg by mouth daily.   sildenafil (VIAGRA) 100 MG tablet Take 100 mg by mouth daily as needed for erectile dysfunction.   tamsulosin (FLOMAX) 0.4 MG CAPS capsule Take 1 capsule (0.4 mg total) by mouth daily.   [DISCONTINUED] aspirin EC 81 MG tablet Take 81 mg by mouth daily.    [DISCONTINUED] ibuprofen (ADVIL,MOTRIN) 200 MG tablet Take 3 tablets (600 mg total) by mouth every 8 (eight) hours as needed for headache or mild pain. Resume 5 days  post-op as needed   Physical Exam:    VS:  BP (!) 160/82   Pulse (!) 52   Resp 16   Ht 6' (1.829 m)   Wt 198 lb 12.8 oz (90.2 kg)   SpO2 98%   BMI 26.96 kg/m    Wt Readings from Last 3 Encounters:  12/28/22 198 lb 12.8 oz (90.2 kg)  05/30/22 205 lb (93 kg)  12/08/21 204 lb (92.5 kg)    GEN: Well nourished, well developed in no acute distress NECK: No JVD; No carotid bruits CARDIAC: RRR, no murmurs, rubs, gallops RESPIRATORY:  Clear to auscultation without rales, wheezing or rhonchi  ABDOMEN: Soft, non-tender, non-distended EXTREMITIES:  No edema; No acute deformity  Asessement and Plan:.    PAF/Palpitations: History of PAF since 2019.  Today he presents reporting intermittent fluttering sensation in his chest that he questions if possibly related to his deep brain stimulator.  Unfortunately given the placement of the battery pack for the stimulator which is in the left chest he would be unable to wear a cardiac monitor for further evaluation.  His EKG today shows sinus bradycardia with left bundle branch block.  He reports that his home heart rates are generally in the 50s to 60s.  He will continue to monitor his heart rate at home.  We discussed his CHA2DS2-VASc score of 3 and increased risk for stroke.  He has not previously been on anticoagulation however with score recommended starting Eliquis 5 mg twice daily.  Patient is agreeable to starting on Eliquis, discussed that this does place him at increased risk for bleeding.  Discussed that he should not use NSAID medications.  Will continue propranolol 60 mg daily (used for tremors).  Check CBC, BMET, Mag and TSH.  CHA2DS2-VASc Score = 3 [CHF History: 1, HTN History: 1, Diabetes History: 0, Stroke History: 0, Vascular Disease History: 0, Age Score: 1, Gender Score: 0].  Therefore, the patient's annual risk of stroke is 3.2 %.      History of mildly reduce EF: Echocardiogram on 06/2021 was limited due to tachycardia, LVEF was estimated at  40 to 45%, LV with mildly decreased function, LV with regional wall motion abnormalities, moderate hypokinesis of the LV, mid apical anterior wall, mild LVH.  Given new palpitations and being in sinus today will repeat echocardiogram for monitoring of EF.  Hypertension: Initial blood pressure today 182/86, on recheck was 160/82.  Patient reports that average home blood pressure is 140/80 discussed that his ideal goal blood pressure would be less than 130/80.  Recommended starting on losartan, patient deferred.  He will  monitor his blood pressure at home and bring in blood pressure log on follow-up.  Parkinson's disease: Followed by a neurologist. Patient reports that he has a deep brain stimulator.  ADDENDUM 12/30/22: Both Dr. Tomie Norton and Dr. Antoine Poche are agreeable to transfer primary cardiologist to Dr. Antoine Poche so patient can be seen in [place] office.   Disposition: F/u with Reather Littler, NP in 4-6 weeks.   Signed, Rip Harbour, NP

## 2022-12-28 ENCOUNTER — Encounter: Payer: Self-pay | Admitting: Cardiology

## 2022-12-28 ENCOUNTER — Ambulatory Visit: Payer: Medicare Other | Attending: Nurse Practitioner | Admitting: Cardiology

## 2022-12-28 VITALS — BP 160/82 | HR 52 | Resp 16 | Ht 72.0 in | Wt 198.8 lb

## 2022-12-28 DIAGNOSIS — I447 Left bundle-branch block, unspecified: Secondary | ICD-10-CM | POA: Diagnosis not present

## 2022-12-28 DIAGNOSIS — I480 Paroxysmal atrial fibrillation: Secondary | ICD-10-CM

## 2022-12-28 DIAGNOSIS — I5022 Chronic systolic (congestive) heart failure: Secondary | ICD-10-CM | POA: Diagnosis not present

## 2022-12-28 DIAGNOSIS — G20A1 Parkinson's disease without dyskinesia, without mention of fluctuations: Secondary | ICD-10-CM

## 2022-12-28 DIAGNOSIS — R002 Palpitations: Secondary | ICD-10-CM

## 2022-12-28 MED ORDER — APIXABAN 5 MG PO TABS: 5.0000 mg | ORAL_TABLET | Freq: Two times a day (BID) | ORAL | 5 refills | Status: DC

## 2022-12-28 NOTE — Patient Instructions (Addendum)
Medication Instructions:  Start Eliquis 5 mg twice daily Stop Asprin and Ibuprofen.   *If you need a refill on your cardiac medications before your next appointment, please call your pharmacy*   Lab Work: BMET, CBC, Magnesium, TSH today   Testing/Procedures: Your physician has requested that you have an echocardiogram. Echocardiography is a painless test that uses sound waves to create images of your heart. It provides your doctor with information about the size and shape of your heart and how well your heart's chambers and valves are working. This procedure takes approximately one hour. There are no restrictions for this procedure. Please do NOT wear cologne, perfume, aftershave, or lotions (deodorant is allowed). Please arrive 15 minutes prior to your appointment time.    Follow-Up: At [facility], you and your health needs are our priority.  As part of our continuing mission to provide you with exceptional heart care, we have created designated Provider Care Teams.  These Care Teams include your primary Cardiologist (physician) and Advanced Practice Providers (APPs -  Physician Assistants and Nurse Practitioners) who all work together to provide you with the care you need, when you need it.  We recommend signing up for the patient portal called "MyChart".  Sign up information is provided on this After Visit Summary.  MyChart is used to connect with patients for Virtual Visits (Telemedicine).  Patients are able to view lab/test results, encounter notes, upcoming appointments, etc.  Non-urgent messages can be sent to your provider as well.   To learn more about what you can do with MyChart, go to [email or web address].    Your next appointment:   4-6 week(s)  Provider:   Reather Littler, NP        Other Instructions

## 2022-12-29 LAB — CBC
Hematocrit: 50.1 % (ref 37.5–51.0)
Hemoglobin: 16.2 g/dL (ref 13.0–17.7)
MCH: 29.9 pg (ref 26.6–33.0)
MCHC: 32.3 g/dL (ref 31.5–35.7)
MCV: 92 fL (ref 79–97)
Platelets: 179 10*3/uL (ref 150–450)
RBC: 5.42 x10E6/uL (ref 4.14–5.80)
RDW: 12.8 % (ref 11.6–15.4)
WBC: 6.2 10*3/uL (ref 3.4–10.8)

## 2022-12-29 LAB — BASIC METABOLIC PANEL
BUN/Creatinine Ratio: 15 (ref 10–24)
BUN: 16 mg/dL (ref 8–27)
CO2: 26 mmol/L (ref 20–29)
Calcium: 9.9 mg/dL (ref 8.6–10.2)
Chloride: 105 mmol/L (ref 96–106)
Creatinine, Ser: 1.05 mg/dL (ref 0.76–1.27)
Glucose: 123 mg/dL — ABNORMAL HIGH (ref 70–99)
Potassium: 4.9 mmol/L (ref 3.5–5.2)
Sodium: 144 mmol/L (ref 134–144)
eGFR: 78 mL/min/{1.73_m2} (ref >59)

## 2022-12-29 LAB — TSH: TSH: 1.31 u[IU]/mL (ref 0.450–4.500)

## 2022-12-29 LAB — MAGNESIUM: Magnesium: 2.3 mg/dL (ref 1.6–2.3)

## 2022-12-30 ENCOUNTER — Telehealth: Payer: Self-pay

## 2022-12-30 NOTE — Telephone Encounter (Addendum)
-----   Message from [facility] sent at 12/30/2022  1:13 PM EDT ----- Please let Mr. Woods know that his CBC shows no evidence of anemia nor infection. His thyroid function is normal. His kidney function and electrolytes are normal.  Please also let him know that Dr. Antoine Poche can see him as a patient at the [facility] office if he would like to reschedule his December appointment if there is availability with Dr. Antoine Poche in [place] at that time.

## 2022-12-30 NOTE — Telephone Encounter (Signed)
Called to discuss results with pt, spoke to pt's wife, John Norton (ok per [facility]) results discuss. John Norton also mentions that she is concerned with pt's blood pressure that has been overtime trending upward. John Norton states that pt's blood pressure is usually in the 150 over 80-90 range. Advised wife to keep a blood pressure log over the next week and give [place] a call next week to provide readings to send to Dr. Antoine Poche to advise. We decided to leave all appointments at this time. Wife verbalizes understanding.

## 2023-01-04 ENCOUNTER — Telehealth: Payer: Self-pay

## 2023-01-04 DIAGNOSIS — R002 Palpitations: Secondary | ICD-10-CM

## 2023-01-04 DIAGNOSIS — I5022 Chronic systolic (congestive) heart failure: Secondary | ICD-10-CM

## 2023-01-04 NOTE — Telephone Encounter (Signed)
Left message for patient to call back  

## 2023-01-04 NOTE — Telephone Encounter (Signed)
-----   Message from [facility] sent at 12/30/2022  1:13 PM EDT ----- Please let John Norton know that his CBC shows no evidence of anemia nor infection. His thyroid function is normal. His kidney function and electrolytes are normal.  Please also let him know that Dr. Antoine Poche can see him as a patient at the [facility] office if he would like to reschedule his December appointment if there is availability with Dr. Antoine Poche in [place] at that time.

## 2023-01-05 MED ORDER — LOSARTAN POTASSIUM 25 MG PO TABS: 25.0000 mg | ORAL_TABLET | Freq: Every day | ORAL | 3 refills | Status: DC

## 2023-01-05 NOTE — Telephone Encounter (Signed)
Called patient to follow up on Blood pressure log they were keeping. 10/31 155/84 11/1 154/111 Believed he was in afib 11/2 141/80 11/3 142/76 11/4 150/72 11/5 150/81 11/6 137/73 11/7 143/79 Blood pressure Average 147/82 Please advise

## 2023-01-05 NOTE — Telephone Encounter (Signed)
Please let John Norton know that I appreciate him following up regarding his blood pressure. We discussed starting Losartan 25 mg daily at office visit, if agreeable would recommend this for his blood pressure given home readings. Please have him repeat a BMET in two weeks. Follow up as planned.

## 2023-01-05 NOTE — Telephone Encounter (Signed)
Below message relayed to patient. Losartan prescriptions sent to pharmacy and Lab ordered.       Please let John Norton know that I appreciate him following up regarding his blood pressure. We discussed starting Losartan 25 mg daily at office visit, if agreeable would recommend this for his blood pressure given home readings. Please have him repeat a BMET in two weeks. Follow up as planned.

## 2023-01-05 NOTE — Telephone Encounter (Signed)
Pt returning nurses phone call. Please advise ?

## 2023-01-28 ENCOUNTER — Other Ambulatory Visit: Payer: Self-pay | Admitting: Family Medicine

## 2023-01-28 DIAGNOSIS — N400 Enlarged prostate without lower urinary tract symptoms: Secondary | ICD-10-CM

## 2023-01-30 NOTE — Telephone Encounter (Signed)
Spoke with pt wife and she will call back to schedule appt when she has her calendar with her

## 2023-01-30 NOTE — Telephone Encounter (Signed)
Dettinger NTBS last OV 10/13/21 NO RF sent to pharmacy last OV greater than a year

## 2023-02-01 ENCOUNTER — Other Ambulatory Visit (HOSPITAL_COMMUNITY): Payer: Medicare Other

## 2023-02-02 ENCOUNTER — Ambulatory Visit: Payer: Medicare Other | Admitting: Cardiology

## 2023-02-02 ENCOUNTER — Ambulatory Visit (HOSPITAL_COMMUNITY): Payer: Medicare Other | Attending: Internal Medicine

## 2023-02-02 DIAGNOSIS — R002 Palpitations: Secondary | ICD-10-CM | POA: Insufficient documentation

## 2023-02-02 DIAGNOSIS — I071 Rheumatic tricuspid insufficiency: Secondary | ICD-10-CM | POA: Insufficient documentation

## 2023-02-02 DIAGNOSIS — I447 Left bundle-branch block, unspecified: Secondary | ICD-10-CM | POA: Diagnosis not present

## 2023-02-02 DIAGNOSIS — I4891 Unspecified atrial fibrillation: Secondary | ICD-10-CM | POA: Diagnosis not present

## 2023-02-02 LAB — ECHOCARDIOGRAM COMPLETE
Area-P 1/2: 1.96 cm2
S' Lateral: 3.9 cm

## 2023-02-07 ENCOUNTER — Telehealth: Payer: Self-pay

## 2023-02-07 NOTE — Telephone Encounter (Signed)
Called patient advised of below they verbalized understanding.

## 2023-02-07 NOTE — Telephone Encounter (Signed)
-----   Message from [facility] sent at 02/07/2023  4:34 PM EST ----- Please let John Norton know that his echocardiogram shows mildly reduced heart squeeze, similar to his prior echocardiogram. There is some stiffening and thickening of the left lower chamber that can occur with history of hypertension. We will discuss results further at office visit on 02/09/23.

## 2023-02-08 NOTE — Progress Notes (Unsigned)
Cardiology Office Note    Date:  02/09/2023  ID:  John Norton, DOB [birth year], MRN [id number] PCP:  Dettinger, Elige Radon, MD  Cardiologist:  Rollene Rotunda, MD  Electrophysiologist:  None   Chief Complaint: Follow up for palpitations   History of Present Illness: .    John Norton is a 67 y.o. male with visit-pertinent history of  paroxysmal atrial fibrillation not on anticoagulation, LBBB, Parkinson's Disease,   Initially seen by Dr. Tomie China in 2018 for preoperative cardiac clearance.  Myoview Lexiscan in 02/2018 indicated stress EF 31%, no evidence of ischemia it was considered however study due to reduced systolic function, it was noted that there was a small fixed defect of moderate severity present in the mid anteroseptal and apex location.  Follow-up echocardiogram on 02/2018 indicated LVEF of 40 to 45%, wall motion was normal, no RWMA, grade 1 diastolic dysfunction.  Most recent echocardiogram on 06/2021 was limited due to tachycardia, LVEF was estimated at 40 to 45%, LV with mildly decreased function, LV with regional wall motion abnormalities, moderate hypokinesis of the LV, mid apical anterior wall, mild LVH.  Patient also has history of PAF since 2019, not previously started on anticoagulation.  In 06/2021 he was started on Multaq which he self discontinued after a month in setting of GI upset.    On 12/28/2022 he was seen in clinic for palpitations.  He reported that he noted a intermittent fluttering sensation in his left lower chest.  He noted he felt it more when he was sitting his recliner, episodes could last anywhere from a few hours to a few minutes.  He denied any associated symptoms.  He reported that he would always check his heart rate with his blood pressure cuff and his heart rate was always in the 50s to 60s.  He questioned if the fluttering sensation was related to the implanted battery pack for his deep brain stimulator that was in his left chest.  Given his  CHA2DS2-VASc score of 3 and increased risk for stroke he was started on Eliquis 5 mg twice daily.  Echocardiogram on 02/02/2023 indicated LVEF of 45 to 50%, LV with mildly decreased function, LV demonstrated global hypokinesis.  Mild concentric LVH, grade 1 diastolic dysfunction.  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  Today he presents for follow-up.  He reports that he is doing well.  He continues to note intermittent fluttering sensation in his chest, he was able to check his heart rate during one episode reports it was increased to the 80s to 90s.  He denies any associated symptoms.  He denies any chest pain, shortness of breath, lower extremity edema, orthopnea or PND.  His wife presents with him today she notes that they tried using a Kardia mobile but unfortunately given his tremors interpretation was not possible.  Labwork independently reviewed: 12/28/2022: Sodium 144, potassium 4.9, creatinine 1.05, magnesium 2.3, hemoglobin 16.2, hematocrit 50.1, TSH 1.3  ROS: .   Today he denies chest pain, shortness of breath, lower extremity edema, fatigue, palpitations, melena, hematuria, hemoptysis, diaphoresis, weakness, presyncope, syncope, orthopnea, and PND.  All other systems are reviewed and otherwise negative. Studies Reviewed: Marland Kitchen    EKG:  EKG is not ordered today.   CV Studies:  Cardiac Studies & Procedures     STRESS TESTS  MYOCARDIAL PERFUSION IMAGING 03/23/2018  Narrative  Nuclear stress EF: 31%.  There was no ST segment deviation  noted during stress.  Defect 1: There is a small, fixed defect of moderate severity present in the mid anteroseptal and apex location.  This is a high risk study due to reduced systolic function.  The left ventricular ejection fraction is moderately decreased (30-44%).  There is no ischemia.  ECHOCARDIOGRAM  ECHOCARDIOGRAM COMPLETE 02/02/2023  Narrative ECHOCARDIOGRAM  REPORT    Patient Name:   John Norton Date of Exam: 02/02/2023 Medical Rec #:  [id number]       Height:       72.0 in Accession #:    [id number]      Weight:       198.8 lb Date of Birth:  February 02, 1956       BSA:          2.125 m Patient Age:    67 years        BP:           136/75 mmHg Patient Gender: M               HR:           92 bpm. Exam Location:  [address]  Procedure: 2D Echo, 3D Echo, Cardiac Doppler and Color Doppler  Indications:    785.1 Palpitation  History:        Patient has prior history of Echocardiogram examinations, most recent 07/21/2021. Arrythmias:LBBB and Atrial Fibrillation.  Sonographer:    Clearence Ped RCS Referring Phys: [id number] [name]  IMPRESSIONS   1. Left ventricular ejection fraction, by estimation, is 45 to 50%. The left ventricle has mildly decreased function. The left ventricle demonstrates global hypokinesis. There is mild concentric left ventricular hypertrophy. Left ventricular diastolic parameters are consistent with Grade I diastolic dysfunction (impaired relaxation). 2. Right ventricular systolic function is normal. The right ventricular size is normal. There is normal pulmonary artery systolic pressure. 3. Left atrial size was mildly dilated. 4. The mitral valve is grossly normal. Trivial mitral valve regurgitation. 5. The aortic valve is tricuspid. Aortic valve regurgitation is not visualized. 6. There is mild dilatation of the ascending aorta, measuring 38 mm. 7. The inferior vena cava is normal in size with greater than 50% respiratory variability, suggesting right atrial pressure of 3 mmHg.  FINDINGS Left Ventricle: Left ventricular ejection fraction, by estimation, is 45 to 50%. The left ventricle has mildly decreased function. The left ventricle demonstrates global hypokinesis. The left ventricular internal cavity size was normal in size. There is mild concentric left ventricular hypertrophy. Abnormal (paradoxical) septal  motion, consistent with left bundle branch block. Left ventricular diastolic parameters are consistent with Grade I diastolic dysfunction (impaired relaxation).  Right Ventricle: The right ventricular size is normal. Right ventricular systolic function is normal. There is normal pulmonary artery systolic pressure. The tricuspid regurgitant velocity is 2.51 m/s, and with an assumed right atrial pressure of 3 mmHg, the estimated right ventricular systolic pressure is 28.2 mmHg.  Left Atrium: Left atrial size was mildly dilated.  Right Atrium: Right atrial size was normal in size.  Pericardium: Trivial pericardial effusion is present.  Mitral Valve: The mitral valve is grossly normal. Trivial mitral valve regurgitation.  Tricuspid Valve: Tricuspid valve regurgitation is mild.  Aortic Valve: The aortic valve is tricuspid. Aortic valve regurgitation is not visualized.  Pulmonic Valve: Pulmonic valve regurgitation is not visualized.  Aorta: There is mild dilatation of the ascending aorta, measuring 38 mm.  Venous: The inferior vena cava is normal in size with greater than 50%  respiratory variability, suggesting right atrial pressure of 3 mmHg.  IAS/Shunts: No atrial level shunt detected by color flow Doppler.   LEFT VENTRICLE PLAX 2D LVIDd:         4.90 cm   Diastology LVIDs:         3.90 cm   LV e' medial:    4.13 cm/s LV PW:         1.20 cm   LV E/e' medial:  14.6 LV IVS:        1.30 cm   LV e' lateral:   5.11 cm/s LVOT diam:     2.20 cm   LV E/e' lateral: 11.8 LV SV:         90 LV SV Index:   42 LVOT Area:     3.80 cm  3D Volume EF: 3D EF:        52 % LV EDV:       196 ml LV ESV:       93 ml LV SV:        103 ml  RIGHT VENTRICLE RV Basal diam:  3.10 cm RVSP:           28.2 mmHg  LEFT ATRIUM             Index        RIGHT ATRIUM           Index LA diam:        4.00 cm 1.88 cm/m   RA Pressure: 3.00 mmHg LA Vol (A2C):   87.9 ml 41.36 ml/m  RA Area:     12.70 cm LA Vol  (A4C):   62.4 ml 29.36 ml/m  RA Volume:   28.20 ml  13.27 ml/m LA Biplane Vol: 80.5 ml 37.88 ml/m AORTIC VALVE LVOT Vmax:   107.00 cm/s LVOT Vmean:  63.600 cm/s LVOT VTI:    0.236 m  AORTA Ao Root diam: 2.90 cm Ao Asc diam:  3.80 cm  MITRAL VALVE               TRICUSPID VALVE MV Area (PHT):             TR Peak grad:   25.2 mmHg MV Decel Time:             TR Vmax:        251.00 cm/s MV E velocity: 60.30 cm/s  Estimated RAP:  3.00 mmHg MV A velocity: 82.80 cm/s  RVSP:           28.2 mmHg MV E/A ratio:  0.73 SHUNTS Systemic VTI:  0.24 m Systemic Diam: 2.20 cm  Carolan Clines Electronically signed by Carolan Clines Signature Date/Time: 02/02/2023/1:59:19 PM    Final              Current Reported Medications:.    Current Meds  Medication Sig   amantadine (SYMMETREL) 100 MG capsule Take 100 mg by mouth 3 (three) times daily.   apixaban (ELIQUIS) 5 MG TABS tablet Take 1 tablet (5 mg total) by mouth 2 (two) times daily.   Ascorbic Acid (VITAMIN C) 100 MG tablet Take 100 mg by mouth daily.   Carbidopa-Levodopa ER (SINEMET CR) 25-100 MG tablet controlled release Take 1 tablet by mouth daily.   famotidine (PEPCID) 20 MG tablet Take 1 tablet (20 mg total) by mouth 2 (two) times daily. (NEEDS TO BE SEEN BEFORE NEXT REFILL)   losartan (COZAAR) 25 MG tablet Take 1 tablet (25 mg total) by  mouth daily.   Multiple Vitamin (MULTI-VITAMINS) TABS Take 1 tablet by mouth daily.    ondansetron (ZOFRAN) 4 MG tablet Take 1 tablet (4 mg total) by mouth every 8 (eight) hours as needed for nausea or vomiting.   pregabalin (LYRICA) 200 MG capsule Take 1 capsule (200 mg total) by mouth 2 (two) times daily.   propranolol ER (INDERAL LA) 60 MG 24 hr capsule Take 60 mg by mouth daily.   sildenafil (VIAGRA) 100 MG tablet Take 100 mg by mouth daily as needed for erectile dysfunction.   tamsulosin (FLOMAX) 0.4 MG CAPS capsule Take 1 capsule (0.4 mg total) by mouth daily.    Physical Exam:    VS:  BP 106/60  (BP Location: Left Arm, Patient Position: Sitting, Cuff Size: Normal)   Pulse (!) 58   Ht 6' (1.829 m)   Wt 200 lb (90.7 kg)   BMI 27.12 kg/m    Wt Readings from Last 3 Encounters:  02/09/23 200 lb (90.7 kg)  12/28/22 198 lb 12.8 oz (90.2 kg)  05/30/22 205 lb (93 kg)    GEN: Well nourished, well developed in no acute distress NECK: No JVD; No carotid bruits CARDIAC: RRR, no murmurs, rubs, gallops RESPIRATORY:  Clear to auscultation without rales, wheezing or rhonchi  ABDOMEN: Soft, non-tender, non-distended EXTREMITIES:  No edema; No acute deformity   Asessement and Plan:.    Palpitations/PAF/LBBB: History of PAF since 2019.  At last OV presented reporting intermittent fluttering sensation in his chest that he questions if possibly related to his deep brain stimulator.  Today he reports ongoing intermittent fluttering sensation in his chest with varying lengths of time for a few minutes to few hours.  He was able to check his pulse rate he noted it was "jumpy" in the 80s to 90s. CHA2DS2-VASc Score = 3 [CHF History: 1, HTN History: 1, Diabetes History: 0, Stroke History: 0, Vascular Disease History: 0, Age Score: 1, Gender Score: 0].  Therefore, the patient's annual risk of stroke is 3.2 %.    He denies any bleeding problems on Eliquis.  Was able to coordinate with [employer] office staff to assist with placement of a 14-day cardiac monitor to assist in determining atrial fibrillation burden, given deep brain stimulator pack in the left chest. Will continue Eliquis 5 mg twice daily and propranolol 60 mg daily (used for tremors). Check CBC and BMET.   History of mildly reduced EF: LVEF in 02/2018 45 to 50%, in 06/2021 LVEF 40 to 45%. Echocardiogram on 02/02/2023 indicated LVEF of 45 to 50%, LV with mildly decreased function, LV demonstrated global hypokinesis.  Mild concentric LVH, grade 1 diastolic dysfunction.  The left atrial size was mildly dilated.  Will reach out to Dr. Antoine Poche to see if  further recommendations for evaluation of global hypokinesis.  Hypertension: Started on losartan 25 mg daily following last office visit.  Blood pressure today well-controlled at 106/60, denies dizziness, lightheadedness presyncope or syncope.  Patient has been monitoring his blood pressure at home with blood pressures averaging less than 130/80. Check BMET.   Parkinson's disease: Followed by a neurologist.  Patient reports that he has a deep brain stimulator with battery pack in the left chest.     Disposition: F/u with Dr. Antoine Poche in January as scheduled.   Signed, Rip Harbour, NP

## 2023-02-09 ENCOUNTER — Encounter: Payer: Self-pay | Admitting: Cardiology

## 2023-02-09 ENCOUNTER — Ambulatory Visit: Payer: Medicare Other | Attending: Cardiology | Admitting: Cardiology

## 2023-02-09 ENCOUNTER — Ambulatory Visit (INDEPENDENT_AMBULATORY_CARE_PROVIDER_SITE_OTHER): Payer: Medicare Other

## 2023-02-09 VITALS — BP 106/60 | HR 58 | Ht 72.0 in | Wt 200.0 lb

## 2023-02-09 DIAGNOSIS — I447 Left bundle-branch block, unspecified: Secondary | ICD-10-CM

## 2023-02-09 DIAGNOSIS — G20A1 Parkinson's disease without dyskinesia, without mention of fluctuations: Secondary | ICD-10-CM

## 2023-02-09 DIAGNOSIS — R002 Palpitations: Secondary | ICD-10-CM | POA: Diagnosis not present

## 2023-02-09 DIAGNOSIS — I10 Essential (primary) hypertension: Secondary | ICD-10-CM | POA: Diagnosis not present

## 2023-02-09 DIAGNOSIS — I480 Paroxysmal atrial fibrillation: Secondary | ICD-10-CM

## 2023-02-09 DIAGNOSIS — I5022 Chronic systolic (congestive) heart failure: Secondary | ICD-10-CM | POA: Diagnosis not present

## 2023-02-09 NOTE — Progress Notes (Unsigned)
ZIO  serial # [id number] from office inventory applied to patient. Dr. Antoine Poche to read.

## 2023-02-09 NOTE — Patient Instructions (Addendum)
Medication Instructions:  No changes *If you need a refill on your cardiac medications before your next appointment, please call your pharmacy*  Lab Work: Today we will Draw BMP and CBC If you have labs (blood work) drawn today and your tests are completely normal, you will receive your results only by: MyChart Message (if you have MyChart) OR A paper copy in the mail If you have any lab test that is abnormal or we need to change your treatment, we will call you to review the results.  Testing/Procedures: No testing  Follow-Up: At [facility], you and your health needs are our priority.  As part of our continuing mission to provide you with exceptional heart care, we have created designated Provider Care Teams.  These Care Teams include your primary Cardiologist (physician) and Advanced Practice Providers (APPs -  Physician Assistants and Nurse Practitioners) who all work together to provide you with the care you need, when you need it.  We recommend signing up for the patient portal called "MyChart".  Sign up information is provided on this After Visit Summary.  MyChart is used to connect with patients for Virtual Visits (Telemedicine).  Patients are able to view lab/test results, encounter notes, upcoming appointments, etc.  Non-urgent messages can be sent to your provider as well.   To learn more about what you can do with MyChart, go to [email or web address].    Your next appointment:   January 6th at 11:40 with Rollene Rotunda, MD

## 2023-02-10 ENCOUNTER — Telehealth: Payer: Self-pay

## 2023-02-10 LAB — BASIC METABOLIC PANEL
BUN/Creatinine Ratio: 19 (ref 10–24)
BUN: 21 mg/dL (ref 8–27)
CO2: 24 mmol/L (ref 20–29)
Calcium: 9.5 mg/dL (ref 8.6–10.2)
Chloride: 104 mmol/L (ref 96–106)
Creatinine, Ser: 1.11 mg/dL (ref 0.76–1.27)
Glucose: 154 mg/dL — ABNORMAL HIGH (ref 70–99)
Potassium: 4.3 mmol/L (ref 3.5–5.2)
Sodium: 144 mmol/L (ref 134–144)
eGFR: 73 mL/min/{1.73_m2} (ref >59)

## 2023-02-10 LAB — CBC
Hematocrit: 48.4 % (ref 37.5–51.0)
Hemoglobin: 16.1 g/dL (ref 13.0–17.7)
MCH: 30.4 pg (ref 26.6–33.0)
MCHC: 33.3 g/dL (ref 31.5–35.7)
MCV: 91 fL (ref 79–97)
Platelets: 178 10*3/uL (ref 150–450)
RBC: 5.3 x10E6/uL (ref 4.14–5.80)
RDW: 13.1 % (ref 11.6–15.4)
WBC: 7 10*3/uL (ref 3.4–10.8)

## 2023-02-10 NOTE — Telephone Encounter (Signed)
-----   Message from John Norton sent at 02/10/2023  7:47 AM EST ----- Please let [name] know that his CBC shows no evidence of anemia or infection. His kidney function is normal and his electrolytes are normal. Good results, continue current medications and follow up as planned.

## 2023-02-10 NOTE — Telephone Encounter (Signed)
Called patient wife advised of below they verbalized understanding.

## 2023-02-18 ENCOUNTER — Other Ambulatory Visit: Payer: Self-pay | Admitting: Family Medicine

## 2023-02-18 DIAGNOSIS — N400 Enlarged prostate without lower urinary tract symptoms: Secondary | ICD-10-CM

## 2023-02-28 DIAGNOSIS — I480 Paroxysmal atrial fibrillation: Secondary | ICD-10-CM | POA: Diagnosis not present

## 2023-03-04 NOTE — Progress Notes (Signed)
 Cardiology Office Note:   Date:  03/06/2023  ID:  John Norton, DOB 03-23-1955, MRN [id number] PCP: Dettinger, Fonda LABOR, MD  [facility] Providers Cardiologist:  Lynwood Schilling, MD {  History of Present Illness:   John Norton is a 68 y.o. male with visit-pertinent history of  paroxysmal atrial fibrillation not on anticoagulation, LBBB, Parkinson's Disease,   He was seen previously by Dr. Anibal.  Myoview  Lexiscan  in 02/2018 indicated stress EF 31%, no evidence of ischemia   There was a small fixed defect of moderate severity present in the mid anteroseptal and apex location.  Follow-up echocardiogram on 02/2018 indicated LVEF of 40 to 45%, wall motion was normal, no RWMA, grade 1 diastolic dysfunction.  Echocardiogram on 02/02/2023 indicated LVEF of 45 to 50%, LV with mildly decreased function, LV demonstrated global hypokinesis.  Mild concentric LVH, grade 1 diastolic dysfunction.  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      He was seen recently and was complaining of some palpitations.  He wore a 2-week monitor.  This did not demonstrated some episodes of SVT with the longest lasting about 20 seconds.  These were episodes he might of felt.  He also had paroxysmal atrial fibrillation with the longest lasting 4 and half hours and an 8% burden.  He said he did not really feel anything for 4 and half hours that he thought lasted that long.  He has not had any presyncope or syncope.  He does not think they are as bothersome as they were when he wore his monitor.  He is not having any chest pressure, neck or arm discomfort.  He is not having any new shortness of breath, PND or orthopnea.  ROS: As stated in the HPI and negative for all other systems.  Studies Reviewed:    EKG:   EKG Interpretation Date/Time:  Monday March 06 2023 12:05:44 EST Ventricular Rate:  58 PR Interval:  146 QRS Duration:  166 QT  Interval:  494 QTC Calculation: 484 R Axis:   20  Text Interpretation: Sinus bradycardia with Premature supraventricular complexes Left bundle branch block When compared with ECG of 28-Dec-2022 14:48, No significant change since last tracing Confirmed by Schilling Lynwood ([id number]) on 03/06/2023 12:21:33 PM    Risk Assessment/Calculations:    CHA2DS2-VASc Score = 3   This indicates a 3.2% annual risk of stroke. The patient's score is based upon: CHF History: 1 HTN History: 1 Diabetes History: 0 Stroke History: 0 Vascular Disease History: 0 Age Score: 1 Gender Score: 0    Physical Exam:   VS:  BP 108/86 (BP Location: Left Arm, Patient Position: Sitting, Cuff Size: Normal)   Pulse (!) 58   Ht 6' (1.829 m)   Wt 202 lb 9.6 oz (91.9 kg)   SpO2 95%   BMI 27.48 kg/m    Wt Readings from Last 3 Encounters:  03/06/23 202 lb 9.6 oz (91.9 kg)  02/09/23 200 lb (90.7 kg)  12/28/22 198 lb 12.8 oz (90.2 kg)     GEN: Well nourished, well developed in no acute distress NECK: No JVD; No carotid bruits CARDIAC: RRR, no murmurs, rubs, gallops RESPIRATORY:  Clear to auscultation without rales, wheezing or rhonchi  ABDOMEN: Soft, non-tender, non-distended EXTREMITIES:  No edema; No deformity   ASSESSMENT AND PLAN:   Palpitations/PAF/LBBB:   The patient does have short runs of SVT and paroxysmal atrial fibrillation.  We had  a long conversation about this.  At this point he will continue on the current dose of beta-blocker and let me know if he has worsening symptoms at which point we might make a change.  His resting heart rate is around 60 and at this point I would like to try to avoid worsening baseline bradycardia.  He agrees at this point that his symptoms are not that bad and he will manage this conservatively.    History of mildly reduced EF:    His EF is 45 to 50%.  No change in therapy.   Hypertension: Blood pressure is at target.  No change in therapy.  Parkinson's disease: He is followed  by neurology and has had deep brain stimulation.      Follow up with me in [place] in six months.   Signed, Lynwood Schilling, MD

## 2023-03-06 ENCOUNTER — Encounter: Payer: Self-pay | Admitting: Cardiology

## 2023-03-06 ENCOUNTER — Ambulatory Visit: Payer: Medicare Other | Attending: Cardiology | Admitting: Cardiology

## 2023-03-06 VITALS — BP 108/86 | HR 58 | Ht 72.0 in | Wt 202.6 lb

## 2023-03-06 DIAGNOSIS — I10 Essential (primary) hypertension: Secondary | ICD-10-CM | POA: Diagnosis not present

## 2023-03-06 DIAGNOSIS — R002 Palpitations: Secondary | ICD-10-CM | POA: Diagnosis not present

## 2023-03-06 DIAGNOSIS — I429 Cardiomyopathy, unspecified: Secondary | ICD-10-CM | POA: Diagnosis not present

## 2023-03-06 NOTE — Patient Instructions (Signed)
Medication Instructions:  No changes *If you need a refill on your cardiac medications before your next appointment, please call your pharmacy*  Follow-Up: At [facility], you and your health needs are our priority.  As part of our continuing mission to provide you with exceptional heart care, we have created designated Provider Care Teams.  These Care Teams include your primary Cardiologist (physician) and Advanced Practice Providers (APPs -  Physician Assistants and Nurse Practitioners) who all work together to provide you with the care you need, when you need it.  We recommend signing up for the patient portal called "MyChart".  Sign up information is provided on this After Visit Summary.  MyChart is used to connect with patients for Virtual Visits (Telemedicine).  Patients are able to view lab/test results, encounter notes, upcoming appointments, etc.  Non-urgent messages can be sent to your provider as well.   To learn more about what you can do with MyChart, go to [email or web address].    Your next appointment:   6 month(s)  Provider:   Minus Breeding, MD

## 2023-05-05 ENCOUNTER — Encounter: Payer: Self-pay | Admitting: *Deleted

## 2023-05-31 ENCOUNTER — Ambulatory Visit: Payer: Medicare Other | Admitting: Family Medicine

## 2023-05-31 VITALS — BP 154/81 | HR 61 | Ht 73.0 in | Wt 202.0 lb

## 2023-05-31 DIAGNOSIS — Z0000 Encounter for general adult medical examination without abnormal findings: Secondary | ICD-10-CM | POA: Diagnosis not present

## 2023-05-31 NOTE — Patient Instructions (Signed)
 There are several Eye Doctors in your area. Here are a few that usually accept all insurance types: Please let us know if you require a referral for an eye exam appointment. Thank you!  [facility]) [address] [place], [place] [address] Phone: [phone]  [facility] [address] [place], [place] [address] Phone: [phone]  [facility] [address] [place], [place] [address] Phone: [phone]  [facility] [address] [place], [place] [address] Phone: [phone]  [facility] [address] [place], [place] [address] Phone: [phone]  [facility] [address] [place], [place] [address] Phone: [phone]   John Norton , Thank you for taking time to come for your Medicare Wellness Visit. I appreciate your ongoing commitment to your health goals. Please review the following plan we discussed and let me know if I can assist you in the future.   Referrals/Orders/Follow-Ups/Clinician Recommendations: Please don't forget to make your eye appointment with [facility] in [place], [place] as soon as you can.  This is a list of the screening recommended for you and due dates:  Health Maintenance  Topic Date Due   Screening for Lung Cancer  01/15/2014   Pneumonia Vaccine (2 of 2 - PPSV23 or PCV20) 03/11/2019   COVID-19 Vaccine (4 - 2024-25 season) 10/30/2022   Flu Shot  09/29/2023   Medicare Annual Wellness Visit  05/30/2024   Colon Cancer Screening  07/25/2026   DTaP/Tdap/Td vaccine (2 - Td or Tdap) 05/08/2030   Hepatitis C Screening  Completed   Zoster (Shingles) Vaccine  Completed   HPV Vaccine  Aged Out    Advanced directives: (Declined) Advance directive discussed with you today. Even though you declined this today, please call our office should you change your mind, and we can give you the proper paperwork for you to fill out.  Next Medicare Annual Wellness Visit scheduled for next year: Yes

## 2023-05-31 NOTE — Progress Notes (Signed)
 Subjective:   John Norton is a 68 y.o. who presents for a Medicare Wellness preventive visit.  Visit Complete: Virtual I connected with  John Norton on 05/31/23 by a audio enabled telemedicine application and verified that I am speaking with the correct person using two identifiers.  Patient Location: Home  Provider Location: Home Office  I discussed the limitations of evaluation and management by telemedicine. The patient expressed understanding and agreed to proceed.  Vital Signs: Because this visit was a virtual/telehealth visit, some criteria may be missing or patient reported. Any vitals not documented were not able to be obtained and vitals that have been documented are patient reported.  VideoDeclined- This patient declined Librarian, academic. Therefore the visit was completed with audio only.  Persons Participating in Visit: Patient.  AWV Questionnaire: No: Patient Medicare AWV questionnaire was not completed prior to this visit.  Cardiac Risk Factors include: advanced age (>33men, >59 women);male gender;Other (see comment), Risk factor comments: back pain, l-bundle branck block, head pain, Parkinson's disease (HCC)     Objective:    Today's Vitals   05/31/23 0858  BP: (!) 154/81  Pulse: 61  Weight: 202 lb (91.6 kg)  Height: 6\' 1"  (1.854 m)   Body mass index is 26.65 kg/m.     05/31/2023    8:30 AM 05/30/2022    9:28 AM 05/26/2021    9:05 AM 05/22/2019   10:41 AM 12/03/2018    2:47 PM 11/03/2016   10:45 AM 10/28/2016   11:36 AM  Advanced Directives  Does Patient Have a Medical Advance Directive? No No No No No No No  Would patient like information on creating a medical advance directive?  No - Patient declined No - Patient declined No - Patient declined  No - Patient declined No - Patient declined    Current Medications (verified) Outpatient Encounter Medications as of 05/31/2023  Medication Sig   acetaminophen (TYLENOL) 325 MG  tablet Take by mouth as needed.   amantadine (SYMMETREL) 100 MG capsule Take 100 mg by mouth 2 (two) times daily.   apixaban (ELIQUIS) 5 MG TABS tablet Take 1 tablet (5 mg total) by mouth 2 (two) times daily.   Ascorbic Acid (VITAMIN C) 100 MG tablet Take 100 mg by mouth daily.   buPROPion (WELLBUTRIN XL) 150 MG 24 hr tablet Take 150 mg by mouth every morning.   Carbidopa-Levodopa ER (SINEMET CR) 25-100 MG tablet controlled release Take 1 tablet by mouth every 4 (four) hours.   famotidine (PEPCID) 20 MG tablet Take 1 tablet (20 mg total) by mouth 2 (two) times daily. (NEEDS TO BE SEEN BEFORE NEXT REFILL)   Multiple Vitamin (MULTI-VITAMINS) TABS Take 1 tablet by mouth daily.    ondansetron (ZOFRAN) 4 MG tablet Take 1 tablet (4 mg total) by mouth every 8 (eight) hours as needed for nausea or vomiting.   pregabalin (LYRICA) 200 MG capsule Take 1 capsule (200 mg total) by mouth 2 (two) times daily.   propranolol ER (INDERAL LA) 60 MG 24 hr capsule Take 60 mg by mouth daily.   sildenafil (VIAGRA) 100 MG tablet Take 100 mg by mouth daily as needed for erectile dysfunction.   tamsulosin (FLOMAX) 0.4 MG CAPS capsule TAKE 1 CAPSULE BY MOUTH EVERY DAY   losartan (COZAAR) 25 MG tablet Take 1 tablet (25 mg total) by mouth daily.   No facility-administered encounter medications on file as of 05/31/2023.    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   CHOLECYSTECTOMY N/A 04/20/2012   Procedure: LAPAROSCOPIC CHOLECYSTECTOMY;  Surgeon: Dalia Heading, MD;  Location: [facility];  Service: General;  Laterality: N/A;   COLONOSCOPY  2013   INGUINAL HERNIA REPAIR  1998   LEXISCAN MYOVIEW  08/22/10    There is no stress-induced ischemia,  but a fixed deficit in the lateral apex and inferior wall.  Ejection fraction 51%   LUMBAR LAMINECTOMY/DECOMPRESSION MICRODISCECTOMY N/A 11/03/2016   Procedure: Microlumbar Decompression L4-5, L5-S1;  Surgeon: Jene Every, MD;  Location: [facility];  Service: Orthopedics;  Laterality: N/A;  120 mins   POLYPECTOMY  2013   TA's   THYROID LOBECTOMY     RIGHT THYROID   Family History  Problem Relation Age of Onset   Diabetes Mother    Hypertension Mother    Glaucoma Mother    Colon polyps Brother    Hypertension Brother    Hypertension Daughter    Colon cancer Neg Hx    Rectal cancer Neg Hx    Stomach cancer Neg Hx    Esophageal cancer Neg Hx    Social History   Socioeconomic History   Marital status: Married    Spouse name: John Norton   Number of children: 1   Years of education: 12   Highest education level: 12th grade  Occupational History   Occupation: disability  Tobacco Use   Smoking status: Former    Current packs/day: 0.00    Average packs/day: 1.5 packs/day for 40.0 years (60.0 ttl pk-yrs)    Types: Cigarettes    Start date: 07/30/1970    Quit date: 07/30/2010    Years since quitting: 12.8   Smokeless tobacco: Current    Types:  Snuff  Vaping Use   Vaping status: Never Used  Substance and Sexual Activity   Alcohol use: No   Drug use: No   Sexual activity: Not on file  Other Topics Concern   Not on file  Social History Narrative   Lives with wife in a one story home.  Has one daughter.  On disability.  Education: 12th   Social Drivers of Health   Financial Resource Strain: Low Risk  (05/31/2023)   Overall Financial Resource Strain (CARDIA)    Difficulty of Paying Living Expenses: Not hard at all  Food Insecurity: No Food Insecurity (05/31/2023)   Hunger Vital Sign    Worried  About Running Out of Food in the Last Year: Never true    Ran Out of Food in the Last Year: Never true  Transportation Needs: No Transportation Needs (05/31/2023)   PRAPARE - Administrator, Civil Service (Medical): No    Lack of Transportation (Non-Medical): No  Physical Activity: Sufficiently Active (05/31/2023)   Exercise Vital Sign    Days of Exercise per Week: 7 days    Minutes of Exercise per Session: 30 min  Stress: No Stress Concern Present (05/31/2023)   [employer] of Occupational Health - Occupational Stress Questionnaire    Feeling of Stress : Only a little  Social Connections: Moderately Isolated (05/31/2023)   Social Connection and Isolation Panel [NHANES]    Frequency of Communication with Friends and Family: Once a week    Frequency of Social Gatherings with Friends and Family: Once a week    Attends Religious Services: More than 4 times per year    Active Member of [employer] or Organizations: No    Attends Engineer, structural: Never    Marital Status: Married    Tobacco Counseling Ready to quit: Not Answered Counseling given: Yes    Clinical Intake:  Pre-visit preparation completed: Yes  Pain : 0-10 (chronic Back pain) Pain Type: Chronic pain Pain Location: Back Pain Onset: More than a month ago Pain Relieving Factors: per pt ibuprofen Effect of Pain on Daily Activities: when doing  anything back hurts, hard to exercise  Pain Relieving Factors: per pt ibuprofen  BMI - recorded: 26.65 Nutritional Status: BMI 25 -29 Overweight Nutritional Risks: None Diabetes: No  Lab Results  Component Value Date   HGBA1C 5.6 10/13/2021   HGBA1C 5.6 03/15/2018   HGBA1C 5.9 (H) 08/21/2010     How often do you need to have someone help you when you read instructions, pamphlets, or other written materials from your doctor or pharmacy?: 1 - Never  Interpreter Needed?: No  Information entered by :: Zane Herald T/CMA   Activities of Daily Living     05/31/2023    8:39 AM  In your present state of health, do you have any difficulty performing the following activities:  Hearing? 1  Comment per pt R-ear a little bit/need ears flused  Vision? 1  Comment per pt a little/last appt 2-3 years agao. pt goes [facility] in [place]  Difficulty concentrating or making decisions? 1  Comment a little bit ie remembering names  Walking or climbing stairs? 0  Dressing or bathing? 0  Doing errands, shopping? 0  Preparing Food and eating ? N  Using the Toilet? N  In the past six months, have you accidently leaked urine? N  Do you have problems with loss of bowel control? N  Managing your Medications? N  Comment wife helps  Managing your Finances? N  Comment wife helps  Housekeeping or managing your Housekeeping? N  Comment wife helps    Patient Care Team: Dettinger, Elige Radon, MD as PCP - General (Family Medicine) Rollene Rotunda, MD as PCP - Cardiology (Cardiology)  Indicate any recent Medical Services you may have received from other than [facility] providers in the past year (date may be approximate).     Assessment:   This is a routine wellness examination for Albie.  Hearing/Vision screen Hearing Screening - Comments:: Pt has a little bit of hearing issue, will get ear flushed at next f/u w/pcp Vision Screening - Comments:: Per pt a little bit of vision def, will  make an eye  appt soon   Goals Addressed             This Visit's Progress    patient stated       Will continue to keep mobile however it's really hard due to neuropathy pain/back pain       Depression Screen     05/31/2023    8:33 AM 05/30/2022    9:28 AM 10/13/2021    9:45 AM 10/13/2021    9:27 AM 05/26/2021    9:01 AM 05/10/2021   10:04 AM 05/07/2020    9:54 AM  PHQ 2/9 Scores  PHQ - 2 Score 4 0 5 0 0 0 0  PHQ- 9 Score 6  10  0 3     Fall Risk     05/31/2023    8:31 AM 05/30/2022    9:26 AM 10/13/2021    9:27 AM 05/26/2021    9:06 AM 05/10/2021   10:04 AM  Fall Risk   Falls in the past year? 1 0 0 1 0  Comment had two falls last wk per wife      Number falls in past yr: 1 0  0   Injury with Fall? 0 0  0   Risk for fall due to : Impaired balance/gait;Impaired mobility;No Fall Risks No Fall Risks  Impaired balance/gait;Impaired mobility   Follow up  Falls prevention discussed  Falls prevention discussed     MEDICARE RISK AT HOME:  Medicare Risk at Home Any stairs in or around the home?: No If so, are there any without handrails?: Yes Home free of loose throw rugs in walkways, pet beds, electrical cords, etc?: Yes Adequate lighting in your home to reduce risk of falls?: Yes Life alert?: No Use of a cane, walker or w/c?: No Grab bars in the bathroom?: No Shower chair or bench in shower?: Yes Elevated toilet seat or a handicapped toilet?: Yes  TIMED UP AND GO:  Was the test performed?  No  Cognitive Function: 6CIT completed        05/31/2023    8:52 AM 05/30/2022    9:29 AM 05/26/2021    9:11 AM 05/22/2019   10:45 AM  6CIT Screen  What Year? 0 points 0 points 0 points 0 points  What month? 0 points 0 points 0 points 0 points  What time? 0 points 0 points 0 points 0 points  Count back from 20 0 points 0 points 0 points 0 points  Months in reverse 0 points 0 points 0 points 0 points  Repeat phrase 0 points 0 points 0 points 0 points  Total Score 0 points 0 points 0 points 0  points    Immunizations Immunization History  Administered Date(s) Administered   Influenza,inj,Quad PF,6+ Mos 01/14/2019   Influenza-Unspecified 01/14/2013, 01/19/2015   Moderna Sars-Covid-2 Vaccination 08/01/2019, 08/29/2019, 03/06/2020   Pneumococcal Conjugate-13 01/14/2019   Tdap 05/07/2020   Zoster Recombinant(Shingrix) 05/02/2019, 07/05/2019    Screening Tests Health Maintenance  Topic Date Due   Lung Cancer Screening  01/15/2014   Pneumonia Vaccine 52+ Years old (2 of 2 - PPSV23 or PCV20) 03/11/2019   COVID-19 Vaccine (4 - 2024-25 season) 10/30/2022   INFLUENZA VACCINE  09/29/2023   Medicare Annual Wellness (AWV)  05/30/2024   Colonoscopy  07/25/2026   DTaP/Tdap/Td (2 - Td or Tdap) 05/08/2030   Hepatitis C Screening  Completed   Zoster Vaccines- Shingrix  Completed   HPV VACCINES  Aged Out  Health Maintenance  Health Maintenance Due  Topic Date Due   Lung Cancer Screening  01/15/2014   Pneumonia Vaccine 25+ Years old (2 of 2 - PPSV23 or PCV20) 03/11/2019   COVID-19 Vaccine (4 - 2024-25 season) 10/30/2022   Health Maintenance Items Addressed: See Nurse Notes  Additional Screening:  Vision Screening: Recommended annual ophthalmology exams for early detection of glaucoma and other disorders of the eye.  Dental Screening: Recommended annual dental exams for proper oral hygiene  Community Resource Referral / Chronic Care Management: CRR required this visit?  No   CCM required this visit?  No     Plan:     I have personally reviewed and noted the following in the patient's chart:   Medical and social history Use of alcohol, tobacco or illicit drugs  Current medications and supplements including opioid prescriptions. Patient is not currently taking opioid prescriptions. Functional ability and status Nutritional status Physical activity Advanced directives List of other physicians Hospitalizations, surgeries, and ER visits in previous 12  months Vitals Screenings to include cognitive, depression, and falls Referrals and appointments  In addition, I have reviewed and discussed with patient certain preventive protocols, quality metrics, and best practice recommendations. A written personalized care plan for preventive services as well as general preventive health recommendations were provided to patient.     Arta Silence, CMA   05/31/2023   After Visit Summary: (MyChart) Due to this being a telephonic visit, the after visit summary with patients personalized plan was offered to patient via MyChart   Notes:  Pt's PHQ 2-9 score: 6

## 2023-06-28 ENCOUNTER — Other Ambulatory Visit: Payer: Self-pay | Admitting: Family Medicine

## 2023-06-28 DIAGNOSIS — N400 Enlarged prostate without lower urinary tract symptoms: Secondary | ICD-10-CM

## 2023-06-28 MED ORDER — TAMSULOSIN HCL 0.4 MG PO CAPS: 0.4000 mg | ORAL_CAPSULE | Freq: Every day | ORAL | 0 refills | Status: DC

## 2023-06-28 NOTE — Telephone Encounter (Signed)
 Appt scheduled for 05/28, please send refill to pharm

## 2023-06-28 NOTE — Telephone Encounter (Signed)
 Dettinger NTBS last OV are on AWV NO RF sent to pharmacy last OV greater than a year

## 2023-06-28 NOTE — Addendum Note (Signed)
 Addended by: Andreal Vultaggio D on: 06/28/2023 08:49 AM   Modules accepted: Orders

## 2023-06-30 DIAGNOSIS — G250 Essential tremor: Secondary | ICD-10-CM | POA: Diagnosis not present

## 2023-06-30 DIAGNOSIS — G20A1 Parkinson's disease without dyskinesia, without mention of fluctuations: Secondary | ICD-10-CM | POA: Diagnosis not present

## 2023-06-30 DIAGNOSIS — S39012A Strain of muscle, fascia and tendon of lower back, initial encounter: Secondary | ICD-10-CM | POA: Diagnosis not present

## 2023-07-26 ENCOUNTER — Encounter: Payer: Self-pay | Admitting: Family Medicine

## 2023-07-26 ENCOUNTER — Ambulatory Visit (INDEPENDENT_AMBULATORY_CARE_PROVIDER_SITE_OTHER): Admitting: Family Medicine

## 2023-07-26 VITALS — BP 133/76 | HR 58 | Ht 73.0 in | Wt 197.0 lb

## 2023-07-26 DIAGNOSIS — K219 Gastro-esophageal reflux disease without esophagitis: Secondary | ICD-10-CM | POA: Diagnosis not present

## 2023-07-26 DIAGNOSIS — Z13220 Encounter for screening for lipoid disorders: Secondary | ICD-10-CM

## 2023-07-26 DIAGNOSIS — N400 Enlarged prostate without lower urinary tract symptoms: Secondary | ICD-10-CM

## 2023-07-26 DIAGNOSIS — J029 Acute pharyngitis, unspecified: Secondary | ICD-10-CM | POA: Diagnosis not present

## 2023-07-26 DIAGNOSIS — M47819 Spondylosis without myelopathy or radiculopathy, site unspecified: Secondary | ICD-10-CM | POA: Diagnosis not present

## 2023-07-26 DIAGNOSIS — R739 Hyperglycemia, unspecified: Secondary | ICD-10-CM

## 2023-07-26 DIAGNOSIS — I480 Paroxysmal atrial fibrillation: Secondary | ICD-10-CM

## 2023-07-26 DIAGNOSIS — Z136 Encounter for screening for cardiovascular disorders: Secondary | ICD-10-CM | POA: Diagnosis not present

## 2023-07-26 DIAGNOSIS — Z122 Encounter for screening for malignant neoplasm of respiratory organs: Secondary | ICD-10-CM

## 2023-07-26 LAB — LIPID PANEL

## 2023-07-26 LAB — BAYER DCA HB A1C WAIVED: HB A1C (BAYER DCA - WAIVED): 5.4 % (ref 4.8–5.6)

## 2023-07-26 MED ORDER — DICLOFENAC SODIUM 1 % EX GEL: 2.0000 g | Freq: Four times a day (QID) | CUTANEOUS | 3 refills | Status: AC

## 2023-07-26 MED ORDER — FAMOTIDINE 20 MG PO TABS: 20.0000 mg | ORAL_TABLET | Freq: Two times a day (BID) | ORAL | 3 refills | Status: AC

## 2023-07-26 MED ORDER — TAMSULOSIN HCL 0.4 MG PO CAPS: 0.4000 mg | ORAL_CAPSULE | Freq: Every day | ORAL | 3 refills | Status: AC

## 2023-07-26 NOTE — Progress Notes (Signed)
 BP 133/76   Pulse (!) 58   Ht 6\' 1"  (1.854 m)   Wt 197 lb (89.4 kg)   SpO2 98%   BMI 25.99 kg/m    Subjective:   Patient ID: John Norton, male    DOB: Jun 05, 1955, 68 y.o.   MRN: [id number]  HPI: AMAL SAIKI is a 68 y.o. male presenting on 07/26/2023 for Medical Management of Chronic Issues   HPI BPH Patient is coming in for recheck on BPH Symptoms: None currently Medication: Flomax  Last PSA: Just over a year ago  GERD Patient is currently on famotidine .  She denies any major symptoms or abdominal pain or belching or burping. She denies any blood in her stool or lightheadedness or dizziness.   Patient had elevated blood sugar on his last few screenings, will check an A1c today as well.  Patient's old right big complaint is that he gets some lower back pain at times, uses ibuprofen  now because Tylenol  does not do his well, we discussed that he is on a blood thinner and he will try Voltaren  gel instead.  Relevant past medical, surgical, family and social history reviewed and updated as indicated. Interim medical history since our last visit reviewed. Allergies and medications reviewed and updated.  Review of Systems  Constitutional:  Negative for chills and fever.  Eyes:  Negative for visual disturbance.  Respiratory:  Negative for shortness of breath and wheezing.   Cardiovascular:  Negative for chest pain and leg swelling.  Musculoskeletal:  Positive for arthralgias and back pain. Negative for gait problem.  Skin:  Negative for rash.  Neurological:  Negative for dizziness and light-headedness.  All other systems reviewed and are negative.   Per HPI unless specifically indicated above   Allergies as of 07/26/2023       Reactions   Codeine  Nausea Only        Medication List        Accurate as of Jul 26, 2023 11:08 AM. If you have any questions, ask your nurse or doctor.          acetaminophen  325 MG tablet Commonly known as: TYLENOL  Take by  mouth as needed.   amantadine  100 MG capsule Commonly known as: SYMMETREL  Take 100 mg by mouth 2 (two) times daily.   apixaban  5 MG Tabs tablet Commonly known as: ELIQUIS  Take 1 tablet (5 mg total) by mouth 2 (two) times daily.   buPROPion 150 MG 24 hr tablet Commonly known as: WELLBUTRIN XL Take 150 mg by mouth every morning.   Carbidopa -Levodopa  ER 25-100 MG tablet controlled release Commonly known as: SINEMET  CR Take 1 tablet by mouth every 4 (four) hours.   diclofenac  Sodium 1 % Gel Commonly known as: Voltaren  Apply 2 g topically 4 (four) times daily. Started by: Lucio Sabin Daniel Johndrow   famotidine  20 MG tablet Commonly known as: PEPCID  Take 1 tablet (20 mg total) by mouth 2 (two) times daily. What changed: additional instructions Changed by: Lucio Sabin Tiffanye Hartmann   losartan  25 MG tablet Commonly known as: COZAAR  Take 1 tablet (25 mg total) by mouth daily.   Multi-Vitamins Tabs Take 1 tablet by mouth daily.   ondansetron  4 MG tablet Commonly known as: Zofran  Take 1 tablet (4 mg total) by mouth every 8 (eight) hours as needed for nausea or vomiting.   pregabalin  200 MG capsule Commonly known as: Lyrica  Take 1 capsule (200 mg total) by mouth 2 (two) times daily.   propranolol  ER 60 MG  24 hr capsule Commonly known as: INDERAL  LA Take 60 mg by mouth daily.   sildenafil 100 MG tablet Commonly known as: VIAGRA Take 100 mg by mouth daily as needed for erectile dysfunction.   tamsulosin  0.4 MG Caps capsule Commonly known as: FLOMAX  Take 1 capsule (0.4 mg total) by mouth daily.   vitamin C 100 MG tablet Take 100 mg by mouth daily.         Objective:   BP 133/76   Pulse (!) 58   Ht 6\' 1"  (1.854 m)   Wt 197 lb (89.4 kg)   SpO2 98%   BMI 25.99 kg/m   Wt Readings from Last 3 Encounters:  07/26/23 197 lb (89.4 kg)  05/31/23 202 lb (91.6 kg)  03/06/23 202 lb 9.6 oz (91.9 kg)    Physical Exam Vitals and nursing note reviewed.  Constitutional:       General: He is not in acute distress.    Appearance: He is well-developed. He is not diaphoretic.  Eyes:     General: No scleral icterus.    Conjunctiva/sclera: Conjunctivae normal.  Neck:     Thyroid : No thyromegaly.  Cardiovascular:     Rate and Rhythm: Normal rate and regular rhythm.     Heart sounds: Normal heart sounds. No murmur heard. Pulmonary:     Effort: Pulmonary effort is normal. No respiratory distress.     Breath sounds: Normal breath sounds. No wheezing.  Musculoskeletal:        General: Normal range of motion.     Cervical back: Neck supple.  Lymphadenopathy:     Cervical: No cervical adenopathy.  Skin:    General: Skin is warm and dry.     Findings: No rash.  Neurological:     Mental Status: He is alert and oriented to person, place, and time.     Coordination: Coordination normal.  Psychiatric:        Behavior: Behavior normal.       Assessment & Plan:   Problem List Items Addressed This Visit       Cardiovascular and Mediastinum   Paroxysmal atrial fibrillation (HCC)   Relevant Orders   CBC with Differential/Platelet     Digestive   GERD (gastroesophageal reflux disease) - Primary   Relevant Medications   famotidine  (PEPCID ) 20 MG tablet   Other Relevant Orders   CBC with Differential/Platelet   CMP14+EGFR     Genitourinary   BPH (benign prostatic hyperplasia)   Relevant Medications   tamsulosin  (FLOMAX ) 0.4 MG CAPS capsule   Other Visit Diagnoses       Screening for lung cancer         Pharyngitis, unspecified etiology       Relevant Medications   famotidine  (PEPCID ) 20 MG tablet     Lipid screening       Relevant Orders   Lipid panel     Elevated blood sugar       Relevant Orders   Bayer DCA Hb A1c Waived     Arthritis of low back       Relevant Medications   diclofenac  Sodium (VOLTAREN ) 1 % GEL       Send in Voltaren  gel for him, refilled medication, will do blood work on the way out. Follow up plan: Return in about 6  months (around 01/26/2024), or if symptoms worsen or fail to improve, for Physical exam and recheck BPH.  Counseling provided for all of the vaccine components Orders Placed This  Encounter  Procedures   CBC with Differential/Platelet   CMP14+EGFR   Lipid panel   Bayer DCA Hb A1c Waived    Jolyne Needs, MD [facility] 07/26/2023, 11:08 AM

## 2023-07-27 LAB — CBC WITH DIFFERENTIAL/PLATELET
Basophils Absolute: 0 10*3/uL (ref 0.0–0.2)
Basos: 1 %
EOS (ABSOLUTE): 0.1 10*3/uL (ref 0.0–0.4)
Eos: 2 %
Hematocrit: 50.3 % (ref 37.5–51.0)
Hemoglobin: 16.1 g/dL (ref 13.0–17.7)
Immature Grans (Abs): 0 10*3/uL (ref 0.0–0.1)
Immature Granulocytes: 1 %
Lymphocytes Absolute: 1.6 10*3/uL (ref 0.7–3.1)
Lymphs: 28 %
MCH: 30.1 pg (ref 26.6–33.0)
MCHC: 32 g/dL (ref 31.5–35.7)
MCV: 94 fL (ref 79–97)
Monocytes Absolute: 0.4 10*3/uL (ref 0.1–0.9)
Monocytes: 8 %
Neutrophils Absolute: 3.4 10*3/uL (ref 1.4–7.0)
Neutrophils: 60 %
Platelets: 177 10*3/uL (ref 150–450)
RBC: 5.35 x10E6/uL (ref 4.14–5.80)
RDW: 14.2 % (ref 11.6–15.4)
WBC: 5.6 10*3/uL (ref 3.4–10.8)

## 2023-07-27 LAB — CMP14+EGFR
ALT: 17 IU/L (ref 0–44)
AST: 16 IU/L (ref 0–40)
Albumin: 4.5 g/dL (ref 3.9–4.9)
Alkaline Phosphatase: 81 IU/L (ref 44–121)
BUN/Creatinine Ratio: 14 (ref 10–24)
BUN: 17 mg/dL (ref 8–27)
Bilirubin Total: 0.7 mg/dL (ref 0.0–1.2)
CO2: 22 mmol/L (ref 20–29)
Calcium: 10.1 mg/dL (ref 8.6–10.2)
Chloride: 105 mmol/L (ref 96–106)
Creatinine, Ser: 1.18 mg/dL (ref 0.76–1.27)
Globulin, Total: 2.2 g/dL (ref 1.5–4.5)
Glucose: 111 mg/dL — ABNORMAL HIGH (ref 70–99)
Potassium: 4.8 mmol/L (ref 3.5–5.2)
Sodium: 146 mmol/L — ABNORMAL HIGH (ref 134–144)
Total Protein: 6.7 g/dL (ref 6.0–8.5)
eGFR: 68 mL/min/{1.73_m2} (ref >59)

## 2023-07-27 LAB — LIPID PANEL
Cholesterol, Total: 160 mg/dL (ref 100–199)
HDL: 29 mg/dL — ABNORMAL LOW (ref >39)
LDL CALC COMMENT:: 5.5 ratio — ABNORMAL HIGH (ref 0.0–5.0)
LDL Chol Calc (NIH): 100 mg/dL — ABNORMAL HIGH (ref 0–99)
Triglycerides: 174 mg/dL — ABNORMAL HIGH (ref 0–149)
VLDL Cholesterol Cal: 31 mg/dL (ref 5–40)

## 2023-08-02 ENCOUNTER — Ambulatory Visit: Payer: Self-pay | Admitting: Family Medicine

## 2023-09-26 DIAGNOSIS — H5315 Visual distortions of shape and size: Secondary | ICD-10-CM | POA: Diagnosis not present

## 2023-09-26 DIAGNOSIS — H43313 Vitreous membranes and strands, bilateral: Secondary | ICD-10-CM | POA: Diagnosis not present

## 2023-09-26 DIAGNOSIS — H2513 Age-related nuclear cataract, bilateral: Secondary | ICD-10-CM | POA: Diagnosis not present

## 2023-10-03 ENCOUNTER — Telehealth: Payer: Self-pay | Admitting: Family Medicine

## 2023-10-03 NOTE — Telephone Encounter (Signed)
 Copied from CRM #[id number]. Topic: General - Other >> Oct 03, 2023 12:01 PM Avram MATSU wrote: Reason for RMF:Fjcpd is calling from [facility] and informed me a PAD Test was done and pt left was leg 0.70 and right leg 0.92.  Michelene stated an offical letter was sent out as well.

## 2023-10-04 NOTE — Telephone Encounter (Signed)
 Okay thanks, we will discuss this at his next visit.

## 2023-12-01 ENCOUNTER — Other Ambulatory Visit: Payer: Self-pay | Admitting: Family Medicine

## 2023-12-01 ENCOUNTER — Telehealth: Payer: Self-pay | Admitting: Family Medicine

## 2023-12-01 NOTE — Telephone Encounter (Signed)
 Copied from CRM [id number]. Topic: Clinical - Medication Question >> Dec 01, 2023  9:57 AM Delon DASEN wrote: Reason for CRM: Dr Rosalia would like Dr Dettinger to take over management of the pregabalin  prescription- [phone]

## 2023-12-01 NOTE — Telephone Encounter (Unsigned)
 Copied from CRM [id number]. Topic: Clinical - Medication Refill >> Dec 01, 2023  9:54 AM Delon T wrote: Medication: Pregabalin  200 mg- Dr Rosalia would like Dr Dettinger to take over the prescription management  Has the patient contacted their pharmacy? Yes (Agent: If no, request that the patient contact the pharmacy for the refill. If patient does not wish to contact the pharmacy document the reason why and proceed with request.) (Agent: If yes, when and what did the pharmacy advise?)  This is the patient's preferred pharmacy:  [id number] - [place], [place] - [address] [place] [address] Phone: [phone] Fax: [phone]    Is this the correct pharmacy for this prescription? Yes If no, delete pharmacy and type the correct one.   Has the prescription been filled recently? Yes  Is the patient out of the medication? Yes  Has the patient been seen for an appointment in the last year OR does the patient have an upcoming appointment? Yes  Can we respond through MyChart? Yes  Agent: Please be advised that Rx refills may take up to 3 business days. We ask that you follow-up with your pharmacy.

## 2023-12-01 NOTE — Telephone Encounter (Signed)
Has to have an appointment.  ?

## 2023-12-01 NOTE — Telephone Encounter (Signed)
 Appt made.

## 2023-12-01 NOTE — Telephone Encounter (Signed)
 That is fine, we can take it over in the future, it needs to be done during an appointment so we can assess and discuss it.  If he has enough to get through to his next visit but that is fine we can discuss it then.  If he does not have enough to get through to his next visit then please make an appointment so he can be seen before he runs out.

## 2023-12-01 NOTE — Telephone Encounter (Signed)
 Dr Rosalia would like Dr Dettinger to take over the prescription management

## 2023-12-04 ENCOUNTER — Ambulatory Visit: Admitting: Family Medicine

## 2023-12-04 ENCOUNTER — Encounter: Payer: Self-pay | Admitting: Family Medicine

## 2023-12-04 VITALS — BP 126/74 | HR 60 | Temp 98.0°F | Ht 73.0 in | Wt 194.0 lb

## 2023-12-04 DIAGNOSIS — Z23 Encounter for immunization: Secondary | ICD-10-CM | POA: Diagnosis not present

## 2023-12-04 DIAGNOSIS — G629 Polyneuropathy, unspecified: Secondary | ICD-10-CM | POA: Diagnosis not present

## 2023-12-04 MED ORDER — PREGABALIN 200 MG PO CAPS: 200.0000 mg | ORAL_CAPSULE | Freq: Three times a day (TID) | ORAL | 1 refills | Status: DC

## 2023-12-04 NOTE — Progress Notes (Signed)
 BP 126/74   Pulse 60   Temp 98 F (36.7 C)   Ht 6' 1 (1.854 m)   Wt 194 lb (88 kg)   SpO2 98%   BMI 25.60 kg/m    Subjective:   Patient ID: John Norton, male    DOB: 1955/04/04, 68 y.o.   MRN: [id number]  HPI: John Norton is a 68 y.o. male presenting on 12/04/2023 for Neurologic Problem (Scalp, face. Burning and itching. Needs Dr. Maryanne to take over Lyrica  )   Discussed the use of AI scribe software for clinical note transcription with the patient, who gave verbal consent to proceed.  History of Present Illness   John Norton is a 68 year old male with neuropathic pain who presents for Lyrica  prescription management.  Neuropathic pain - Burning sensation primarily across the scalp for approximately 40 years - Attributed to nerve damage of unknown etiology - Previous treatments included pain management injections without benefit - Other medications such as gabapentin  were ineffective - Topical treatments such as 'head on' provided only temporary relief  Pregabalin  (lyrica ) therapy - Stable dose of 200 mg three times daily for several years - Initially titrated from 50 mg to current effective dose - Consistent with monthly to 6-week refills without issues of adherence - Missed doses result in burning and itching sensations, attributed to withdrawal symptoms - Seeking continued prescription management after previous neurologist discontinued prescribing  Parkinsonian symptoms - History of Parkinson's disease - Slurred speech attributed to Parkinson's disease          Relevant past medical, surgical, family and social history reviewed and updated as indicated. Interim medical history since our last visit reviewed. Allergies and medications reviewed and updated.  Review of Systems  Constitutional:  Negative for chills and fever.  Respiratory:  Negative for shortness of breath and wheezing.   Cardiovascular:  Negative for chest pain and leg swelling.   Musculoskeletal:  Negative for back pain and gait problem.  Skin:  Negative for rash.  Neurological:  Positive for numbness.  All other systems reviewed and are negative.   Per HPI unless specifically indicated above   Allergies as of 12/04/2023       Reactions   Codeine  Nausea Only        Medication List        Accurate as of December 04, 2023  3:18 PM. If you have any questions, ask your nurse or doctor.          acetaminophen  325 MG tablet Commonly known as: TYLENOL  Take by mouth as needed.   amantadine  100 MG capsule Commonly known as: SYMMETREL  Take 100 mg by mouth 2 (two) times daily.   apixaban  5 MG Tabs tablet Commonly known as: ELIQUIS  Take 1 tablet (5 mg total) by mouth 2 (two) times daily.   buPROPion 150 MG 24 hr tablet Commonly known as: WELLBUTRIN XL Take 150 mg by mouth every morning.   Carbidopa -Levodopa  ER 25-100 MG tablet controlled release Commonly known as: SINEMET  CR Take 1 tablet by mouth every 4 (four) hours.   diclofenac  Sodium 1 % Gel Commonly known as: Voltaren  Apply 2 g topically 4 (four) times daily.   famotidine  20 MG tablet Commonly known as: PEPCID  Take 1 tablet (20 mg total) by mouth 2 (two) times daily.   losartan  25 MG tablet Commonly known as: COZAAR  Take 1 tablet (25 mg total) by mouth daily.   Multi-Vitamins Tabs Take 1 tablet by mouth daily.  ondansetron  4 MG tablet Commonly known as: Zofran  Take 1 tablet (4 mg total) by mouth every 8 (eight) hours as needed for nausea or vomiting.   pregabalin  200 MG capsule Commonly known as: Lyrica  Take 1 capsule (200 mg total) by mouth 3 (three) times daily. What changed: when to take this Changed by: Fonda LABOR Jeralyn Nolden   propranolol  ER 60 MG 24 hr capsule Commonly known as: INDERAL  LA Take 60 mg by mouth daily.   sildenafil 100 MG tablet Commonly known as: VIAGRA Take 100 mg by mouth daily as needed for erectile dysfunction.   tamsulosin  0.4 MG Caps  capsule Commonly known as: FLOMAX  Take 1 capsule (0.4 mg total) by mouth daily.   vitamin C 100 MG tablet Take 100 mg by mouth daily.         Objective:   BP 126/74   Pulse 60   Temp 98 F (36.7 C)   Ht 6' 1 (1.854 m)   Wt 194 lb (88 kg)   SpO2 98%   BMI 25.60 kg/m   Wt Readings from Last 3 Encounters:  12/04/23 194 lb (88 kg)  07/26/23 197 lb (89.4 kg)  05/31/23 202 lb (91.6 kg)    Physical Exam Physical Exam   NECK: Thyroid  normal. CHEST: Lungs clear to auscultation. CARDIOVASCULAR: Heart sounds regular.         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 Withdrawal symptoms noted with missed doses. Discussed potential dosage reduction and associated risks. - Prescribe pregabalin  200 mg three times daily. - Discuss option to reduce pregabalin  to twice daily to assess tolerance and symptom control. - Conduct urine drug screen and have him sign a controlled substance agreement. - Recommend vitamin B12 supplementation.  General Health Maintenance Due for routine vaccinations. - Administer flu shot.          Follow up plan: Return if symptoms worsen or fail to improve.  Counseling provided for all of the vaccine components Orders Placed This Encounter  Procedures   ToxASSURE Select 13 (MW), Urine    Fonda Levins, MD [facility] 12/04/2023, 3:18 PM

## 2023-12-07 LAB — TOXASSURE SELECT 13 (MW), URINE

## 2023-12-13 ENCOUNTER — Ambulatory Visit: Payer: Self-pay | Admitting: Family Medicine

## 2023-12-30 ENCOUNTER — Other Ambulatory Visit: Payer: Self-pay | Admitting: Cardiology

## 2023-12-30 DIAGNOSIS — R002 Palpitations: Secondary | ICD-10-CM

## 2023-12-30 DIAGNOSIS — I5020 Unspecified systolic (congestive) heart failure: Secondary | ICD-10-CM

## 2024-01-30 ENCOUNTER — Other Ambulatory Visit: Payer: Self-pay | Admitting: Cardiology

## 2024-01-30 DIAGNOSIS — I5020 Unspecified systolic (congestive) heart failure: Secondary | ICD-10-CM

## 2024-01-30 DIAGNOSIS — R002 Palpitations: Secondary | ICD-10-CM

## 2024-02-01 ENCOUNTER — Encounter: Payer: Self-pay | Admitting: Family Medicine

## 2024-02-01 ENCOUNTER — Ambulatory Visit: Payer: Self-pay | Admitting: Family Medicine

## 2024-02-01 VITALS — BP 148/81 | HR 65 | Ht 73.0 in | Wt 200.0 lb

## 2024-02-01 DIAGNOSIS — R5383 Other fatigue: Secondary | ICD-10-CM

## 2024-02-01 DIAGNOSIS — I480 Paroxysmal atrial fibrillation: Secondary | ICD-10-CM

## 2024-02-01 DIAGNOSIS — F02B3 Dementia in other diseases classified elsewhere, moderate, with mood disturbance: Secondary | ICD-10-CM

## 2024-02-01 DIAGNOSIS — M48061 Spinal stenosis, lumbar region without neurogenic claudication: Secondary | ICD-10-CM

## 2024-02-01 DIAGNOSIS — Z9689 Presence of other specified functional implants: Secondary | ICD-10-CM

## 2024-02-01 DIAGNOSIS — G20A1 Parkinson's disease without dyskinesia, without mention of fluctuations: Secondary | ICD-10-CM

## 2024-02-01 DIAGNOSIS — G629 Polyneuropathy, unspecified: Secondary | ICD-10-CM

## 2024-02-01 LAB — URINALYSIS, COMPLETE
Bilirubin, UA: NEGATIVE
Glucose, UA: NEGATIVE
Ketones, UA: NEGATIVE
Leukocytes,UA: NEGATIVE
Nitrite, UA: NEGATIVE
Protein,UA: NEGATIVE
RBC, UA: NEGATIVE
Specific Gravity, UA: 1.025 (ref 1.005–1.030)
Urobilinogen, Ur: 1 mg/dL (ref 0.2–1.0)
pH, UA: 5.5 (ref 5.0–7.5)

## 2024-02-01 LAB — MICROSCOPIC EXAMINATION
Bacteria, UA: NONE SEEN
Epithelial Cells (non renal): NONE SEEN /HPF (ref 0–10)
RBC, Urine: NONE SEEN /HPF (ref 0–2)
Renal Epithel, UA: NONE SEEN /HPF
Yeast, UA: NONE SEEN

## 2024-02-01 LAB — LIPID PANEL

## 2024-02-01 MED ORDER — BUPROPION HCL ER (XL) 150 MG PO TB24: 150.0000 mg | ORAL_TABLET | Freq: Every morning | ORAL | 1 refills | Status: AC

## 2024-02-01 MED ORDER — PREGABALIN 200 MG PO CAPS: 200.0000 mg | ORAL_CAPSULE | Freq: Three times a day (TID) | ORAL | 5 refills | Status: AC

## 2024-02-01 NOTE — Progress Notes (Signed)
 BP (!) 148/81   Pulse 65   Ht 6' 1 (1.854 m)   Wt 200 lb (90.7 kg)   SpO2 98%   BMI 26.39 kg/m    Subjective:   Patient ID: John Norton, male    DOB: 09-Oct-1955, 68 y.o.   MRN: [id number]  HPI: John Norton is a 68 y.o. male presenting on 02/01/2024 for Medical Management of Chronic Issues, Gastroesophageal Reflux, Peripheral Neuropathy, and Tremors   Discussed the use of AI scribe software for clinical note transcription with the patient, who gave verbal consent to proceed.  History of Present Illness   John Norton is a 68 year old male with Parkinson's disease who presents with worsening symptoms and fatigue. He is accompanied by his wife.  Fatigue and sleep disturbance - Increased fatigue since shortly after Thanksgiving - Sensation of heaviness in the eyes - Feels pulse in head when lying down, non-painful - Sleeping more than usual but sleep is non-restorative - Frequently falls asleep in recliner  Parkinsonian motor symptoms - Worsening mobility and increased difficulty walking - Shuffling gait - Sensation of leaning forward and running - Difficulty balancing improvements in walking and speech with deep brain stimulation (DBS) adjustments - DBS devices implanted bilaterally with controller for settings adjustment - Out of menthone medication, currently out of stock  Mood and neuropsychiatric symptoms - Increased depression observed by wife - Previously took bupropion  for depression and anxiety, discontinued after running out - No significant benefit from bupropion  while taking it - Mood and energy levels have worsened since discontinuing bupropion   Auditory symptoms - Tinnitus present - Difficulty hearing television, requires increased volume - Not using hearing aid  Urinary symptoms - No urinary burning or pain - Increased urinary frequency during episodes of atrial fibrillation - No kidney pain      Relevant past medical, surgical,  family and social history reviewed and updated as indicated. Interim medical history since our last visit reviewed. Allergies and medications reviewed and updated.  Review of Systems  Constitutional:  Positive for fatigue. Negative for chills and fever.  HENT:  Positive for hearing loss and tinnitus.   Eyes:  Negative for visual disturbance.  Respiratory:  Negative for shortness of breath and wheezing.   Cardiovascular:  Negative for chest pain and leg swelling.  Musculoskeletal:  Positive for arthralgias.  Skin:  Negative for rash.  Neurological:  Positive for tremors, speech difficulty and weakness.  All other systems reviewed and are negative.   Per HPI unless specifically indicated above   Allergies as of 02/01/2024       Reactions   Codeine  Nausea Only        Medication List        Accurate as of February 01, 2024 11:59 PM. If you have any questions, ask your nurse or doctor.          STOP taking these medications    apixaban  5 MG Tabs tablet Commonly known as: ELIQUIS  Stopped by: John LABOR Rex Magee       TAKE these medications    acetaminophen  325 MG tablet Commonly known as: TYLENOL  Take by mouth as needed.   amantadine  100 MG capsule Commonly known as: SYMMETREL  Take 100 mg by mouth 2 (two) times daily.   buPROPion  150 MG 24 hr tablet Commonly known as: WELLBUTRIN  XL Take 1 tablet (150 mg total) by mouth every morning.   Carbidopa -Levodopa  ER 25-100 MG tablet controlled release Commonly known as: SINEMET  CR  Take 1 tablet by mouth every 4 (four) hours.   diclofenac  Sodium 1 % Gel Commonly known as: Voltaren  Apply 2 g topically 4 (four) times daily.   famotidine  20 MG tablet Commonly known as: PEPCID  Take 1 tablet (20 mg total) by mouth 2 (two) times daily.   losartan  25 MG tablet Commonly known as: COZAAR  TAKE 1 TABLET (25 MG TOTAL) BY MOUTH DAILY.   Multi-Vitamins Tabs Take 1 tablet by mouth daily.   ondansetron  4 MG tablet Commonly  known as: Zofran  Take 1 tablet (4 mg total) by mouth every 8 (eight) hours as needed for nausea or vomiting.   pregabalin  200 MG capsule Commonly known as: Lyrica  Take 1 capsule (200 mg total) by mouth 3 (three) times daily.   propranolol  ER 60 MG 24 hr capsule Commonly known as: INDERAL  LA Take 60 mg by mouth daily.   sildenafil 100 MG tablet Commonly known as: VIAGRA Take 100 mg by mouth daily as needed for erectile dysfunction.   tamsulosin  0.4 MG Caps capsule Commonly known as: FLOMAX  Take 1 capsule (0.4 mg total) by mouth daily.   vitamin C 100 MG tablet Take 100 mg by mouth daily.         Objective:   BP (!) 148/81   Pulse 65   Ht 6' 1 (1.854 m)   Wt 200 lb (90.7 kg)   SpO2 98%   BMI 26.39 kg/m   Wt Readings from Last 3 Encounters:  02/01/24 200 lb (90.7 kg)  12/04/23 194 lb (88 kg)  07/26/23 197 lb (89.4 kg)    Physical Exam Physical Exam   HEENT: Cerumen present in both ears. NECK: Thyroid  without nodules. CHEST: Lungs clear to auscultation bilaterally. CARDIOVASCULAR: Heart sounds regular. EXTREMITIES: No swelling in extremities, good pulses.         Assessment & Plan:   Problem List Items Addressed This Visit       Cardiovascular and Mediastinum   Paroxysmal atrial fibrillation (HCC)   Relevant Orders   CBC With Diff/Platelet (Completed)   CMP14+EGFR (Completed)   Lipid panel (Completed)     Nervous and Auditory   Parkinson's disease (HCC) - Primary   Relevant Medications   pregabalin  (LYRICA ) 200 MG capsule   buPROPion  (WELLBUTRIN  XL) 150 MG 24 hr tablet   Other Relevant Orders   CBC With Diff/Platelet (Completed)   CMP14+EGFR (Completed)   Lipid panel (Completed)   Ambulatory referral to Neurology   Neuropathy   Relevant Medications   pregabalin  (LYRICA ) 200 MG capsule   Other Relevant Orders   CBC With Diff/Platelet (Completed)   CMP14+EGFR (Completed)   Lipid panel (Completed)     Other   Spinal stenosis at L4-L5 level    Relevant Orders   CBC With Diff/Platelet (Completed)   CMP14+EGFR (Completed)   Lipid panel (Completed)   S/P deep brain stimulator placement   Relevant Orders   CBC With Diff/Platelet (Completed)   CMP14+EGFR (Completed)   Lipid panel (Completed)   Ambulatory referral to Neurology   Other Visit Diagnoses       Decreased energy       Relevant Orders   CBC With Diff/Platelet (Completed)   CMP14+EGFR (Completed)   Lipid panel (Completed)   Urinalysis, Complete (Completed)         Parkinson's disease with deep brain stimulator Worsening symptoms with DBS settings challenges and menthone supply issues. - Referred to Neurology in [facility] for DBS programming and management. - Ensured menthone supply is  addressed.  Depression Increased depressive symptoms after bupropion  discontinuation. Discussed restarting bupropion  with expected effects in 2-3 weeks. - Restarted bupropion  and instructed to monitor for improvement over 2-3 weeks.  Paroxysmal atrial fibrillation Discussed possible link to increased urination. - Performed urine test to rule out urinary infection.  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 Discussed hearing aids benefits. - Encouraged consideration of hearing aids for hearing loss and tinnitus.  Cerumen impaction, bilateral Bilateral cerumen impaction noted, contributing to tinnitus. - Recommended ear wax drops and flushing for cerumen removal.  Left knee bone spur Bone spur on left knee likely due to altered gait from Parkinson's disease.       Follow up plan: Return in about 6 months (around 08/01/2024), or if symptoms worsen or fail to improve, for Physical exam and recheck neuropathy and A-fib.  Counseling provided for all of the vaccine components Orders Placed This Encounter  Procedures   Microscopic Examination   CBC With Diff/Platelet   CMP14+EGFR   Lipid panel   Urinalysis, Complete    Ambulatory referral to Neurology    John Levins, MD [facility] 02/05/2024, 8:42 AM

## 2024-02-02 LAB — CBC WITH DIFF/PLATELET
Basophils Absolute: 0 x10E3/uL (ref 0.0–0.2)
Basos: 1 %
EOS (ABSOLUTE): 0.1 x10E3/uL (ref 0.0–0.4)
Eos: 1 %
Hematocrit: 49.3 % (ref 37.5–51.0)
Hemoglobin: 16.4 g/dL (ref 13.0–17.7)
Immature Grans (Abs): 0 x10E3/uL (ref 0.0–0.1)
Immature Granulocytes: 0 %
Lymphocytes Absolute: 1.6 x10E3/uL (ref 0.7–3.1)
Lymphs: 29 %
MCH: 30.8 pg (ref 26.6–33.0)
MCHC: 33.3 g/dL (ref 31.5–35.7)
MCV: 93 fL (ref 79–97)
Monocytes Absolute: 0.5 x10E3/uL (ref 0.1–0.9)
Monocytes: 8 %
Neutrophils Absolute: 3.3 x10E3/uL (ref 1.4–7.0)
Neutrophils: 60 %
Platelets: 200 x10E3/uL (ref 150–450)
RBC: 5.32 x10E6/uL (ref 4.14–5.80)
RDW: 13.2 % (ref 11.6–15.4)
WBC: 5.5 x10E3/uL (ref 3.4–10.8)

## 2024-02-02 LAB — CMP14+EGFR
ALT: 15 IU/L (ref 0–44)
AST: 17 IU/L (ref 0–40)
Albumin: 4.3 g/dL (ref 3.9–4.9)
Alkaline Phosphatase: 77 IU/L (ref 47–123)
BUN/Creatinine Ratio: 22 (ref 10–24)
BUN: 19 mg/dL (ref 8–27)
Bilirubin Total: 1 mg/dL (ref 0.0–1.2)
CO2: 25 mmol/L (ref 20–29)
Calcium: 9.5 mg/dL (ref 8.6–10.2)
Chloride: 106 mmol/L (ref 96–106)
Creatinine, Ser: 0.86 mg/dL (ref 0.76–1.27)
Globulin, Total: 2.1 g/dL (ref 1.5–4.5)
Glucose: 112 mg/dL — AB (ref 70–99)
Potassium: 4.1 mmol/L (ref 3.5–5.2)
Sodium: 144 mmol/L (ref 134–144)
Total Protein: 6.4 g/dL (ref 6.0–8.5)
eGFR: 95 mL/min/1.73 (ref >59)

## 2024-02-02 LAB — LIPID PANEL
Cholesterol, Total: 162 mg/dL (ref 100–199)
HDL: 31 mg/dL — AB (ref >39)
LDL CALC COMMENT:: 5.2 ratio — AB (ref 0.0–5.0)
LDL Chol Calc (NIH): 97 mg/dL (ref 0–99)
Triglycerides: 193 mg/dL — AB (ref 0–149)
VLDL Cholesterol Cal: 34 mg/dL (ref 5–40)

## 2024-02-03 ENCOUNTER — Encounter: Payer: Self-pay | Admitting: Family Medicine

## 2024-02-08 ENCOUNTER — Ambulatory Visit: Payer: Self-pay | Admitting: Family Medicine

## 2024-02-12 ENCOUNTER — Telehealth: Payer: Self-pay | Admitting: Family Medicine

## 2024-02-12 DIAGNOSIS — H9319 Tinnitus, unspecified ear: Secondary | ICD-10-CM

## 2024-02-12 NOTE — Telephone Encounter (Unsigned)
 Copied from CRM #[id number]. Topic: Referral - Request for Referral >> Feb 12, 2024 12:27 PM Susanna ORN wrote: Did the patient discuss referral with their provider in the last year? Yes (If No - schedule appointment) (If Yes - send message)  Appointment offered? No  Type of order/referral and detailed reason for visit: ENT referral for ringing of ears  Preference of office, provider, location: [facility] in [place], [place], requesting to see Dr. Daniel Fess, MD  If referral order, have you been seen by this specialty before? No (If Yes, this issue or another issue? When? Where?  Can we respond through MyChart? Yes

## 2024-02-12 NOTE — Telephone Encounter (Signed)
 Go ahead and place the ENT referral for him, diagnosis tinnitus, thanks

## 2024-02-13 NOTE — Telephone Encounter (Signed)
 Referral placed. Wife aware.

## 2024-02-14 ENCOUNTER — Encounter (INDEPENDENT_AMBULATORY_CARE_PROVIDER_SITE_OTHER): Payer: Self-pay

## 2024-03-05 ENCOUNTER — Encounter (INDEPENDENT_AMBULATORY_CARE_PROVIDER_SITE_OTHER): Payer: Self-pay | Admitting: Physician Assistant

## 2024-03-05 ENCOUNTER — Ambulatory Visit (INDEPENDENT_AMBULATORY_CARE_PROVIDER_SITE_OTHER): Admitting: Physician Assistant

## 2024-03-05 VITALS — BP 163/80 | HR 64 | Temp 97.6°F | Ht 72.0 in | Wt 200.0 lb

## 2024-03-05 DIAGNOSIS — H9311 Tinnitus, right ear: Secondary | ICD-10-CM

## 2024-03-05 DIAGNOSIS — H6123 Impacted cerumen, bilateral: Secondary | ICD-10-CM

## 2024-03-05 NOTE — Progress Notes (Unsigned)
 Patient took BP medication this morning at around 8.

## 2024-03-06 NOTE — Progress Notes (Addendum)
 Dear Dr. Maryanne, Here is my assessment for our mutual patient, John Norton. Thank you for allowing me the opportunity to care for your patient. Please do not hesitate to contact me should you have any other questions. Sincerely, Chyrl Cohen PA-C  Otolaryngology Clinic Note Referring provider: Dr. Maryanne HPI:  John Norton is a 69 y.o. male kindly referred by Dr. Maryanne   Discussed the use of AI scribe software for clinical note transcription with the patient, who gave verbal consent to proceed.  History of Present Illness    John Norton is a 69 year old male with Parkinson's disease status post bilateral deep brain stimulation who presents with several months of right-sided tinnitus and hearing loss.  For several months, he has experienced persistent right-sided tinnitus, described as a buzzing noise similar to cicadas, which is more pronounced in quiet environments such as when lying in bed or when the television is off. He also notes subjective right-sided hearing loss, with hearing perceived as slightly better on the left compared to the right. Both symptoms are localized to the right ear, with no involvement of the left ear.  He denies ear trauma, exposure to ototoxic medications, chemotherapy, or radiation. He only takes aspirin  occasionally and not in high doses. He has not experienced headache, dizziness, numbness, or weakness.  He has attempted to clean his ears at home using peroxide and Debrox, but reports difficulty removing earwax and being unable to see inside his ears.           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   CHOLECYSTECTOMY N/A 04/20/2012   Procedure: LAPAROSCOPIC CHOLECYSTECTOMY;  Surgeon: Oneil DELENA Budge, MD;  Location: [facility];  Service: General;  Laterality: N/A;   COLONOSCOPY  2013   INGUINAL HERNIA REPAIR  1998   LEXISCAN  MYOVIEW   08/22/10    There is no stress-induced ischemia,  but a fixed deficit in the lateral apex and inferior wall.  Ejection fraction 51%   LUMBAR LAMINECTOMY/DECOMPRESSION MICRODISCECTOMY N/A 11/03/2016   Procedure: Microlumbar Decompression L4-5, L5-S1;  Surgeon: Duwayne Purchase, MD;  Location: [facility];  Service: Orthopedics;  Laterality: N/A;  120 mins   POLYPECTOMY  2013   TA's   THYROID  LOBECTOMY     RIGHT THYROID     Family History  Problem Relation Age of Onset  Diabetes Mother    Hypertension Mother    Glaucoma Mother    Colon polyps Brother    Hypertension Brother    Hypertension Daughter    Colon cancer Neg Hx    Rectal cancer Neg Hx    Stomach cancer Neg Hx    Esophageal cancer Neg Hx      Social Connections: Moderately Isolated (05/31/2023)   Social Connection and Isolation Panel    Frequency of Communication with Friends and Family: Once a week    Frequency of Social Gatherings with Friends and Family:  Once a week    Attends Religious Services: More than 4 times per year    Active Member of [employer] or Organizations: No    Attends Engineer, Structural: Never    Marital Status: Married     Current Medications[1]   Physical Exam:   BP (!) 163/80   Pulse 64   Temp 97.6 F (36.4 C)   Ht 6' (1.829 m)   Wt 200 lb (90.7 kg)   SpO2 95%   BMI 27.12 kg/m   Pertinent Findings  CN II-XII grossly intact Bilateral EAC with cerumen impaction  Anterior rhinoscopy: Septum midline; bilateral inferior turbinates with no hypertrophy No lesions of oral cavity/oropharynx No obviously palpable neck masses/lymphadenopathy/thyromegaly No respiratory distress or stridor   Seprately Identifiable Procedures:  Procedure: Bilateral ear microscopy and cerumen removal using microscope (CPT 69210) - Mod 50 Pre-procedure diagnosis: bilateral cerumen impaction external auditory canals Post-procedure diagnosis: same Indication: bilateral cerumen impaction; given patient's otologic complaints and history as well as for improved and comprehensive examination of external ear and tympanic membrane, bilateral otologic examination using microscope was performed and impacted cerumen removed  Procedure: Patient was placed semi-recumbent. Both ear canals were examined using the microscope with findings above. Cerumen removed from bilateral external auditory canals using suction and currette with improvement in EAC examination and patency. Left: EAC was patent. TM was intact . Middle ear was aerated. Drainage: none Right: EAC was patent. TM was intact . Middle ear was aerated . Drainage: none Patient tolerated the procedure well.   Impression & Plans:  John Norton is a 69 y.o. male with the following   Assessment and Plan    Tinnitus of right ear  Chronic right-sided tinnitus with possible mild hearing loss.  - Performed bilateral ear canal cleaning to remove impacted cerumen. - Provided education on  tinnitus management, including use of background noise or white noise machines. - Advised that if tinnitus resolves after cerumen removal, no further testing is necessary. - Ordered audiometry   Impacted cerumen, bilateral Significant cerumen impaction bilaterally, potentially contributing to hearing loss   - Performed bilateral ear canal cleaning  - Provided education on safe cerumen removal at home, recommending Debrox over straight peroxide.    - f/u Phone office visit with audio    Thank you for allowing me the opportunity to care for your patient. Please do not hesitate to contact me should you have any other questions.  Sincerely, Chyrl Cohen PA-C [facility] Phone: [phone] Fax: [phone]  03/06/2024, 10:33 AM         [1]  Current Outpatient Medications:    acetaminophen  (TYLENOL ) 325 MG tablet, Take by mouth as needed., Disp: , Rfl:    amantadine  (SYMMETREL ) 100 MG capsule, Take 100 mg by mouth 2 (two) times daily., Disp: , Rfl: 5   Ascorbic Acid (VITAMIN C) 100 MG tablet, Take 100 mg by mouth daily., Disp: ,  Rfl:    buPROPion  (WELLBUTRIN  XL) 150 MG 24 hr tablet, Take 1 tablet (150 mg total) by mouth every morning., Disp: 90 tablet, Rfl: 1   Carbidopa -Levodopa  ER (SINEMET  CR) 25-100 MG tablet controlled release, Take 1 tablet by mouth every 4 (four) hours., Disp: , Rfl:    diclofenac  Sodium (VOLTAREN ) 1 % GEL, Apply 2 g topically 4 (four) times daily., Disp: 350 g, Rfl: 3   famotidine  (PEPCID ) 20 MG tablet, Take 1 tablet (20 mg total) by mouth 2 (two) times daily., Disp: 180 tablet, Rfl: 3   losartan  (COZAAR ) 25 MG tablet, TAKE 1 TABLET (25 MG TOTAL) BY MOUTH DAILY., Disp: 90 tablet, Rfl: 0   Multiple Vitamin (MULTI-VITAMINS) TABS, Take 1 tablet by mouth daily. , Disp: , Rfl:    ondansetron  (ZOFRAN ) 4 MG tablet, Take 1 tablet (4 mg total) by mouth every 8 (eight) hours as needed for nausea or vomiting., Disp: 20 tablet, Rfl: 0   pregabalin   (LYRICA ) 200 MG capsule, Take 1 capsule (200 mg total) by mouth 3 (three) times daily., Disp: 90 capsule, Rfl: 5   propranolol  ER (INDERAL  LA) 60 MG 24 hr capsule, Take 60 mg by mouth daily., Disp: , Rfl:    sildenafil (VIAGRA) 100 MG tablet, Take 100 mg by mouth daily as needed for erectile dysfunction., Disp: , Rfl:    tamsulosin  (FLOMAX ) 0.4 MG CAPS capsule, Take 1 capsule (0.4 mg total) by mouth daily., Disp: 90 capsule, Rfl: 3

## 2024-03-11 ENCOUNTER — Ambulatory Visit (INDEPENDENT_AMBULATORY_CARE_PROVIDER_SITE_OTHER): Admitting: Audiology

## 2024-03-11 DIAGNOSIS — H93291 Other abnormal auditory perceptions, right ear: Secondary | ICD-10-CM

## 2024-03-11 DIAGNOSIS — Z0110 Encounter for examination of ears and hearing without abnormal findings: Secondary | ICD-10-CM

## 2024-03-11 DIAGNOSIS — Z01118 Encounter for examination of ears and hearing with other abnormal findings: Secondary | ICD-10-CM

## 2024-03-11 NOTE — Progress Notes (Signed)
" °  [address] [place], [place] [address] [phone]  Audiological Evaluation    Name: John Norton     DOB:   [birth year]      MRN:   [id number]                                                                                     Service Date: 03/11/2024     Accompanied by: self   Patient comes today after Reyes Cohen, PA-C sent a referral for a hearing evaluation due to concerns with tinnitus.   Symptoms Yes Details  Hearing loss  [x]  Right-sided hearing loss for years  Tinnitus  [x]  right-sided tinnitus  for years, mostly at night or when it is quiet  Ear pain/ infections/pressure  []    Balance problems  []  none  Noise exposure history  [x]  Shoot guns in the past, right ear open to hear  Previous ear surgeries  []    Family history of hearing loss  []  none  Amplification  []    Other  [x]  Parkinson's disease     Otoscopy: Right ear: Clear external ear canal and notable landmarks visualized on the tympanic membrane. Left ear:  Clear external ear canal and notable landmarks visualized on the tympanic membrane.  Tympanometry: Right ear: Type A - Normal external ear canal volume with normal middle ear pressure and normal tympanic membrane compliance. Findings are consistent with normal middle ear function. Left ear: Type A - Normal external ear canal volume with normal middle ear pressure and normal tympanic membrane compliance. Findings are consistent with normal middle ear function.  Hearing Evaluation The hearing test results were completed under headphones and results are deemed to be of good reliability. Test technique:  conventional    Pure tone Audiometry: Both ears: Normal to borderline normal hearing from [phone] Hz.  Speech Audiometry: Right ear- Speech Reception Threshold (SRT) was obtained at 20 dBHL. Left ear-Speech Reception Threshold (SRT) was obtained at 15 dBHL.   Word Recognition Score Tested using NU-6 (recorded) Right ear: 96% was  obtained at a presentation level of 60 dBHL with contralateral masking which is deemed as  excellent. Left ear: 100% was obtained at a presentation level of 60 dBHL with contralateral masking which is deemed as  excellent.   Impression: There is not a significant difference in pure-tone thresholds between ears., There is not a significant difference in the word recognition score in between ears.    Recommendations: Follow up with ENT as scheduled. Return for a hearing evaluation if concerns with hearing changes arise or per MD recommendation. Patient may consider using a white noise machine or fan to help reduce tinnitus perception, also may consider [facility] for tinnitus management.   Stephenson Cichy MARIE LEROUX-MARTINEZ, AUD  "

## 2024-03-15 ENCOUNTER — Telehealth (INDEPENDENT_AMBULATORY_CARE_PROVIDER_SITE_OTHER): Payer: Self-pay

## 2024-03-15 NOTE — Telephone Encounter (Signed)
 Called and spoke to patient's wife regarding his hearing test. She says that the patient is concerned about the ringing in his ears and would like to know if there is anything he can do. He hears the ringing mainly when it is quiet. She says that she will ask the patient if he wants to come in for a follow up and let us  know.

## 2024-08-01 ENCOUNTER — Ambulatory Visit: Admitting: Family Medicine
